# Patient Record
Sex: Male | Born: 1935 | Race: Black or African American | Hispanic: No | Marital: Married | Smoking: Never smoker
Health system: Southern US, Community
[De-identification: ages and names within clinical notes are randomized; demographics above are authoritative.]

## PROBLEM LIST (undated history)

## (undated) DIAGNOSIS — D649 Anemia, unspecified: Secondary | ICD-10-CM

## (undated) DIAGNOSIS — E039 Hypothyroidism, unspecified: Secondary | ICD-10-CM

## (undated) DIAGNOSIS — N189 Chronic kidney disease, unspecified: Secondary | ICD-10-CM

## (undated) DIAGNOSIS — D469 Myelodysplastic syndrome, unspecified: Principal | ICD-10-CM

## (undated) DIAGNOSIS — I10 Essential (primary) hypertension: Secondary | ICD-10-CM

## (undated) HISTORY — DX: Myelodysplastic syndrome, unspecified: D46.9

## (undated) HISTORY — DX: Hypothyroidism, unspecified: E03.9

---

## 2003-12-26 ENCOUNTER — Inpatient Hospital Stay: Payer: Self-pay

## 2003-12-26 ENCOUNTER — Other Ambulatory Visit: Payer: Self-pay

## 2005-01-28 ENCOUNTER — Inpatient Hospital Stay: Payer: Self-pay | Admitting: Internal Medicine

## 2005-03-06 ENCOUNTER — Observation Stay: Payer: Self-pay | Admitting: Gastroenterology

## 2007-12-09 ENCOUNTER — Inpatient Hospital Stay: Payer: Self-pay | Admitting: Internal Medicine

## 2008-12-01 ENCOUNTER — Ambulatory Visit: Payer: Self-pay | Admitting: Internal Medicine

## 2009-02-01 ENCOUNTER — Ambulatory Visit: Payer: Self-pay | Admitting: Internal Medicine

## 2009-03-01 ENCOUNTER — Ambulatory Visit: Payer: Self-pay | Admitting: Internal Medicine

## 2009-05-01 ENCOUNTER — Ambulatory Visit: Payer: Self-pay | Admitting: Internal Medicine

## 2009-05-03 ENCOUNTER — Ambulatory Visit: Payer: Self-pay | Admitting: Internal Medicine

## 2009-06-01 ENCOUNTER — Ambulatory Visit: Payer: Self-pay | Admitting: Internal Medicine

## 2009-08-01 ENCOUNTER — Ambulatory Visit: Payer: Self-pay | Admitting: Internal Medicine

## 2009-08-22 ENCOUNTER — Ambulatory Visit: Payer: Self-pay | Admitting: Gastroenterology

## 2009-08-25 ENCOUNTER — Ambulatory Visit: Payer: Self-pay | Admitting: Internal Medicine

## 2009-09-01 ENCOUNTER — Ambulatory Visit: Payer: Self-pay | Admitting: Internal Medicine

## 2009-10-14 ENCOUNTER — Inpatient Hospital Stay (HOSPITAL_COMMUNITY): Admission: EM | Admit: 2009-10-14 | Discharge: 2009-10-19 | Payer: Self-pay | Admitting: Emergency Medicine

## 2009-10-14 ENCOUNTER — Ambulatory Visit: Payer: Self-pay | Admitting: Pulmonary Disease

## 2009-11-10 ENCOUNTER — Ambulatory Visit (HOSPITAL_COMMUNITY): Admission: RE | Admit: 2009-11-10 | Discharge: 2009-11-10 | Payer: Self-pay | Admitting: Gastroenterology

## 2009-11-29 ENCOUNTER — Ambulatory Visit: Payer: Self-pay | Admitting: Internal Medicine

## 2009-12-01 ENCOUNTER — Ambulatory Visit: Payer: Self-pay | Admitting: Internal Medicine

## 2010-03-15 LAB — URINALYSIS, ROUTINE W REFLEX MICROSCOPIC
Hgb urine dipstick: NEGATIVE
Specific Gravity, Urine: 1.016 (ref 1.005–1.030)
Urobilinogen, UA: 1 mg/dL (ref 0.0–1.0)

## 2010-03-15 LAB — CBC
HCT: 23.6 % — ABNORMAL LOW (ref 39.0–52.0)
Hemoglobin: 10.2 g/dL — ABNORMAL LOW (ref 13.0–17.0)
Hemoglobin: 8.5 g/dL — ABNORMAL LOW (ref 13.0–17.0)
Hemoglobin: 8.6 g/dL — ABNORMAL LOW (ref 13.0–17.0)
Hemoglobin: 8.7 g/dL — ABNORMAL LOW (ref 13.0–17.0)
Hemoglobin: 9.3 g/dL — ABNORMAL LOW (ref 13.0–17.0)
MCH: 33.7 pg (ref 26.0–34.0)
MCH: 34 pg (ref 26.0–34.0)
MCH: 34.1 pg — ABNORMAL HIGH (ref 26.0–34.0)
MCH: 34.2 pg — ABNORMAL HIGH (ref 26.0–34.0)
MCHC: 34.8 g/dL (ref 30.0–36.0)
MCHC: 34.9 g/dL (ref 30.0–36.0)
MCHC: 35.3 g/dL (ref 30.0–36.0)
MCHC: 35.4 g/dL (ref 30.0–36.0)
MCHC: 35.6 g/dL (ref 30.0–36.0)
MCV: 96 fL (ref 78.0–100.0)
MCV: 96.5 fL (ref 78.0–100.0)
MCV: 97 fL (ref 78.0–100.0)
MCV: 97.1 fL (ref 78.0–100.0)
MCV: 97.7 fL (ref 78.0–100.0)
MCV: 98.1 fL (ref 78.0–100.0)
Platelets: 127 10*3/uL — ABNORMAL LOW (ref 150–400)
Platelets: 61 10*3/uL — ABNORMAL LOW (ref 150–400)
Platelets: 61 10*3/uL — ABNORMAL LOW (ref 150–400)
Platelets: 69 10*3/uL — ABNORMAL LOW (ref 150–400)
Platelets: 74 10*3/uL — ABNORMAL LOW (ref 150–400)
RBC: 2.49 MIL/uL — ABNORMAL LOW (ref 4.22–5.81)
RBC: 2.57 MIL/uL — ABNORMAL LOW (ref 4.22–5.81)
RBC: 2.58 MIL/uL — ABNORMAL LOW (ref 4.22–5.81)
RBC: 2.59 MIL/uL — ABNORMAL LOW (ref 4.22–5.81)
RBC: 2.98 MIL/uL — ABNORMAL LOW (ref 4.22–5.81)
RDW: 16.9 % — ABNORMAL HIGH (ref 11.5–15.5)
RDW: 17 % — ABNORMAL HIGH (ref 11.5–15.5)
RDW: 18.1 % — ABNORMAL HIGH (ref 11.5–15.5)
WBC: 5.7 10*3/uL (ref 4.0–10.5)
WBC: 6 10*3/uL (ref 4.0–10.5)
WBC: 8.1 10*3/uL (ref 4.0–10.5)

## 2010-03-15 LAB — URINE MICROSCOPIC-ADD ON

## 2010-03-15 LAB — COMPREHENSIVE METABOLIC PANEL
ALT: 19 U/L (ref 0–53)
ALT: 21 U/L (ref 0–53)
AST: 28 U/L (ref 0–37)
AST: 29 U/L (ref 0–37)
AST: 33 U/L (ref 0–37)
Albumin: 2.5 g/dL — ABNORMAL LOW (ref 3.5–5.2)
Alkaline Phosphatase: 69 U/L (ref 39–117)
BUN: 13 mg/dL (ref 6–23)
BUN: 34 mg/dL — ABNORMAL HIGH (ref 6–23)
CO2: 23 mEq/L (ref 19–32)
CO2: 23 mEq/L (ref 19–32)
CO2: 24 mEq/L (ref 19–32)
Calcium: 8.2 mg/dL — ABNORMAL LOW (ref 8.4–10.5)
Calcium: 8.3 mg/dL — ABNORMAL LOW (ref 8.4–10.5)
Chloride: 110 mEq/L (ref 96–112)
Chloride: 113 mEq/L — ABNORMAL HIGH (ref 96–112)
Creatinine, Ser: 1.27 mg/dL (ref 0.4–1.5)
Creatinine, Ser: 1.45 mg/dL (ref 0.4–1.5)
GFR calc Af Amer: >60 mL/min (ref >60)
GFR calc Af Amer: >60 mL/min (ref >60)
GFR calc Af Amer: >60 mL/min (ref >60)
GFR calc non Af Amer: 48 mL/min — ABNORMAL LOW (ref >60)
GFR calc non Af Amer: 51 mL/min — ABNORMAL LOW (ref >60)
GFR calc non Af Amer: 56 mL/min — ABNORMAL LOW (ref >60)
Glucose, Bld: 90 mg/dL (ref 70–99)
Potassium: 3.8 mEq/L (ref 3.5–5.1)
Sodium: 136 mEq/L (ref 135–145)
Sodium: 139 mEq/L (ref 135–145)
Total Bilirubin: 1.2 mg/dL (ref 0.3–1.2)
Total Bilirubin: 1.4 mg/dL — ABNORMAL HIGH (ref 0.3–1.2)
Total Protein: 5.2 g/dL — ABNORMAL LOW (ref 6.0–8.3)

## 2010-03-15 LAB — DIFFERENTIAL
Basophils Absolute: 0 10*3/uL (ref 0.0–0.1)
Eosinophils Relative: 2 % (ref 0–5)
Lymphocytes Relative: 22 % (ref 12–46)
Neutro Abs: 6.5 10*3/uL (ref 1.7–7.7)
Neutrophils Relative %: 65 % (ref 43–77)

## 2010-03-15 LAB — BASIC METABOLIC PANEL
CO2: 22 mEq/L (ref 19–32)
Calcium: 9 mg/dL (ref 8.4–10.5)
Chloride: 110 mEq/L (ref 96–112)
Creatinine, Ser: 1.44 mg/dL (ref 0.4–1.5)
GFR calc Af Amer: 58 mL/min — ABNORMAL LOW (ref >60)
Glucose, Bld: 88 mg/dL (ref 70–99)

## 2010-03-15 LAB — PROTIME-INR
INR: 1.32 (ref 0.00–1.49)
INR: 1.35 (ref 0.00–1.49)
INR: 1.35 (ref 0.00–1.49)
INR: 1.4 (ref 0.00–1.49)
Prothrombin Time: 16.6 seconds — ABNORMAL HIGH (ref 11.6–15.2)
Prothrombin Time: 16.9 seconds — ABNORMAL HIGH (ref 11.6–15.2)
Prothrombin Time: 16.9 seconds — ABNORMAL HIGH (ref 11.6–15.2)

## 2010-03-15 LAB — MRSA PCR SCREENING: MRSA by PCR: NEGATIVE

## 2010-03-15 LAB — URINE CULTURE: Culture: NO GROWTH

## 2010-03-15 LAB — MAGNESIUM: Magnesium: 1.8 mg/dL (ref 1.5–2.5)

## 2010-03-16 LAB — CBC
HCT: 25.8 % — ABNORMAL LOW (ref 39.0–52.0)
Hemoglobin: 9.3 g/dL — ABNORMAL LOW (ref 13.0–17.0)
Hemoglobin: 9.9 g/dL — ABNORMAL LOW (ref 13.0–17.0)
MCH: 34.1 pg — ABNORMAL HIGH (ref 26.0–34.0)
MCHC: 35.3 g/dL (ref 30.0–36.0)
MCV: 95.9 fL (ref 78.0–100.0)
Platelets: 62 10*3/uL — ABNORMAL LOW (ref 150–400)
Platelets: 85 10*3/uL — ABNORMAL LOW (ref 150–400)
RBC: 2.59 MIL/uL — ABNORMAL LOW (ref 4.22–5.81)
RBC: 2.9 MIL/uL — ABNORMAL LOW (ref 4.22–5.81)
RDW: 17.5 % — ABNORMAL HIGH (ref 11.5–15.5)
WBC: 12.6 10*3/uL — ABNORMAL HIGH (ref 4.0–10.5)
WBC: 14.1 10*3/uL — ABNORMAL HIGH (ref 4.0–10.5)

## 2010-03-16 LAB — TYPE AND SCREEN
Antibody Screen: NEGATIVE
Unit division: 0
Unit division: 0

## 2010-03-16 LAB — HEPATITIS PANEL, ACUTE
HCV Ab: NEGATIVE
Hep A IgM: NEGATIVE
Hep B C IgM: NEGATIVE
Hepatitis B Surface Ag: NEGATIVE

## 2010-03-16 LAB — PREPARE FRESH FROZEN PLASMA
Unit division: 0
Unit division: 0

## 2010-03-16 LAB — COMPREHENSIVE METABOLIC PANEL
ALT: 18 U/L (ref 0–53)
AST: 38 U/L — ABNORMAL HIGH (ref 0–37)
Albumin: 2.5 g/dL — ABNORMAL LOW (ref 3.5–5.2)
CO2: 16 mEq/L — ABNORMAL LOW (ref 19–32)
Chloride: 109 mEq/L (ref 96–112)
GFR calc Af Amer: 49 mL/min — ABNORMAL LOW (ref >60)
GFR calc non Af Amer: 41 mL/min — ABNORMAL LOW (ref >60)
Potassium: 4.4 mEq/L (ref 3.5–5.1)
Sodium: 137 mEq/L (ref 135–145)
Total Bilirubin: 2 mg/dL — ABNORMAL HIGH (ref 0.3–1.2)

## 2010-03-16 LAB — PREPARE RBC (CROSSMATCH)

## 2010-03-16 LAB — HEPATIC FUNCTION PANEL
ALT: 20 U/L (ref 0–53)
Bilirubin, Direct: 0.4 mg/dL — ABNORMAL HIGH (ref 0.0–0.3)
Indirect Bilirubin: 1.2 mg/dL — ABNORMAL HIGH (ref 0.3–0.9)
Total Protein: 5 g/dL — ABNORMAL LOW (ref 6.0–8.3)

## 2010-03-16 LAB — POCT I-STAT, CHEM 8
BUN: 35 mg/dL — ABNORMAL HIGH (ref 6–23)
Chloride: 111 mEq/L (ref 96–112)
Creatinine, Ser: 1.3 mg/dL (ref 0.4–1.5)
Glucose, Bld: 174 mg/dL — ABNORMAL HIGH (ref 70–99)
Hemoglobin: 9.2 g/dL — ABNORMAL LOW (ref 13.0–17.0)
Potassium: 4.7 mEq/L (ref 3.5–5.1)
Sodium: 141 mEq/L (ref 135–145)

## 2010-03-16 LAB — DIFFERENTIAL
Basophils Absolute: 0 10*3/uL (ref 0.0–0.1)
Basophils Absolute: 0 10*3/uL (ref 0.0–0.1)
Basophils Relative: 0 % (ref 0–1)
Eosinophils Absolute: 0 10*3/uL (ref 0.0–0.7)
Eosinophils Relative: 0 % (ref 0–5)
Eosinophils Relative: 1 % (ref 0–5)
Monocytes Absolute: 1 10*3/uL (ref 0.1–1.0)
Monocytes Absolute: 1.7 10*3/uL — ABNORMAL HIGH (ref 0.1–1.0)
Neutro Abs: 8.1 10*3/uL — ABNORMAL HIGH (ref 1.7–7.7)

## 2010-03-16 LAB — HEMOGLOBIN A1C: Hgb A1c MFr Bld: 5.4 % (ref <5.7)

## 2010-03-16 LAB — TSH: TSH: 0.824 u[IU]/mL (ref 0.350–4.500)

## 2010-03-16 LAB — MRSA PCR SCREENING: MRSA by PCR: NEGATIVE

## 2010-03-16 LAB — MAGNESIUM: Magnesium: 1.8 mg/dL (ref 1.5–2.5)

## 2010-03-16 LAB — PROTIME-INR: Prothrombin Time: 23.6 seconds — ABNORMAL HIGH (ref 11.6–15.2)

## 2010-03-16 LAB — ETHANOL: Alcohol, Ethyl (B): <5 mg/dL (ref 0–10)

## 2010-05-31 ENCOUNTER — Ambulatory Visit: Payer: Self-pay | Admitting: Internal Medicine

## 2010-06-02 ENCOUNTER — Ambulatory Visit: Payer: Self-pay | Admitting: Internal Medicine

## 2010-08-29 ENCOUNTER — Ambulatory Visit: Payer: Self-pay | Admitting: Internal Medicine

## 2010-09-02 ENCOUNTER — Ambulatory Visit: Payer: Self-pay | Admitting: Internal Medicine

## 2010-12-29 ENCOUNTER — Ambulatory Visit: Payer: Self-pay | Admitting: Internal Medicine

## 2011-01-02 ENCOUNTER — Ambulatory Visit: Payer: Self-pay | Admitting: Internal Medicine

## 2011-09-24 ENCOUNTER — Emergency Department: Payer: Self-pay | Admitting: Emergency Medicine

## 2011-09-24 LAB — COMPREHENSIVE METABOLIC PANEL
Albumin: 3.4 g/dL (ref 3.4–5.0)
Alkaline Phosphatase: 114 U/L (ref 50–136)
Anion Gap: 8 (ref 7–16)
BUN: 30 mg/dL — ABNORMAL HIGH (ref 7–18)
Bilirubin,Total: 0.9 mg/dL (ref 0.2–1.0)
Calcium, Total: 9.3 mg/dL (ref 8.5–10.1)
Co2: 25 mmol/L (ref 21–32)
EGFR (Non-African Amer.): 57 — ABNORMAL LOW
Glucose: 133 mg/dL — ABNORMAL HIGH (ref 65–99)
Potassium: 4.2 mmol/L (ref 3.5–5.1)
SGOT(AST): 31 U/L (ref 15–37)
Sodium: 141 mmol/L (ref 136–145)
Total Protein: 7.8 g/dL (ref 6.4–8.2)

## 2011-09-24 LAB — PROTIME-INR
INR: 1.2
Prothrombin Time: 15.4 secs — ABNORMAL HIGH (ref 11.5–14.7)

## 2011-09-24 LAB — CBC
MCHC: 33.3 g/dL (ref 32.0–36.0)
MCV: 102 fL — ABNORMAL HIGH (ref 80–100)
Platelet: 146 10*3/uL — ABNORMAL LOW (ref 150–440)
RBC: 3.74 10*6/uL — ABNORMAL LOW (ref 4.40–5.90)
RDW: 17.3 % — ABNORMAL HIGH (ref 11.5–14.5)
WBC: 6 10*3/uL (ref 3.8–10.6)

## 2011-09-24 LAB — URINALYSIS, COMPLETE
Bacteria: NONE SEEN
Glucose,UR: NEGATIVE mg/dL (ref 0–75)
Ketone: NEGATIVE
Nitrite: NEGATIVE
Ph: 5 (ref 4.5–8.0)
Squamous Epithelial: 1
WBC UR: NONE SEEN /HPF (ref 0–5)

## 2011-10-05 ENCOUNTER — Ambulatory Visit: Payer: Self-pay | Admitting: Gastroenterology

## 2011-12-28 ENCOUNTER — Ambulatory Visit: Payer: Self-pay | Admitting: Oncology

## 2011-12-28 LAB — CBC CANCER CENTER
Basophil #: 0 x10 3/mm (ref 0.0–0.1)
Basophil %: 0.7 %
Eosinophil #: 0.1 x10 3/mm (ref 0.0–0.7)
Eosinophil %: 1.4 %
HGB: 11.6 g/dL — ABNORMAL LOW (ref 13.0–18.0)
Lymphocyte #: 0.7 x10 3/mm — ABNORMAL LOW (ref 1.0–3.6)
Lymphocyte %: 16 %
MCH: 26.1 pg (ref 26.0–34.0)
MCHC: 31.3 g/dL — ABNORMAL LOW (ref 32.0–36.0)
MCV: 83 fL (ref 80–100)
Neutrophil #: 2.9 x10 3/mm (ref 1.4–6.5)
Neutrophil %: 67 %
Platelet: 97 x10 3/mm — ABNORMAL LOW (ref 150–440)
RBC: 4.43 10*6/uL (ref 4.40–5.90)
WBC: 4.4 x10 3/mm (ref 3.8–10.6)

## 2012-01-02 ENCOUNTER — Ambulatory Visit: Payer: Self-pay | Admitting: Oncology

## 2012-02-02 ENCOUNTER — Inpatient Hospital Stay: Payer: Self-pay | Admitting: Internal Medicine

## 2012-02-02 LAB — CK TOTAL AND CKMB (NOT AT ARMC): CK, Total: 156 U/L (ref 35–232)

## 2012-02-02 LAB — COMPREHENSIVE METABOLIC PANEL
Albumin: 3.2 g/dL — ABNORMAL LOW (ref 3.4–5.0)
Alkaline Phosphatase: 128 U/L (ref 50–136)
Anion Gap: 7 (ref 7–16)
BUN: 49 mg/dL — ABNORMAL HIGH (ref 7–18)
Bilirubin,Total: 0.8 mg/dL (ref 0.2–1.0)
Chloride: 112 mmol/L — ABNORMAL HIGH (ref 98–107)
Co2: 25 mmol/L (ref 21–32)
EGFR (African American): >60
EGFR (Non-African Amer.): 52 — ABNORMAL LOW
Glucose: 121 mg/dL — ABNORMAL HIGH (ref 65–99)
Osmolality: 301 (ref 275–301)
Potassium: 4.4 mmol/L (ref 3.5–5.1)
SGPT (ALT): 32 U/L (ref 12–78)
Total Protein: 7.2 g/dL (ref 6.4–8.2)

## 2012-02-02 LAB — CBC
HCT: 25.8 % — ABNORMAL LOW (ref 40.0–52.0)
HGB: 8.1 g/dL — ABNORMAL LOW (ref 13.0–18.0)
MCHC: 31.2 g/dL — ABNORMAL LOW (ref 32.0–36.0)
MCV: 83 fL (ref 80–100)
RBC: 3.1 10*6/uL — ABNORMAL LOW (ref 4.40–5.90)

## 2012-02-02 LAB — PROTIME-INR: INR: 1.3

## 2012-02-02 LAB — HEMOGLOBIN: HGB: 7 g/dL — ABNORMAL LOW (ref 13.0–18.0)

## 2012-02-03 LAB — CBC WITH DIFFERENTIAL/PLATELET
Basophil %: 0.5 %
Eosinophil #: 0.2 10*3/uL (ref 0.0–0.7)
Eosinophil %: 2.7 %
HCT: 20.6 % — ABNORMAL LOW (ref 40.0–52.0)
HGB: 6.4 g/dL — ABNORMAL LOW (ref 13.0–18.0)
Lymphocyte %: 27.7 %
MCH: 26 pg (ref 26.0–34.0)
MCV: 83 fL (ref 80–100)
Monocyte %: 13.9 %
RBC: 2.48 10*6/uL — ABNORMAL LOW (ref 4.40–5.90)
RDW: 20.9 % — ABNORMAL HIGH (ref 11.5–14.5)

## 2012-02-03 LAB — COMPREHENSIVE METABOLIC PANEL
Albumin: 2.6 g/dL — ABNORMAL LOW (ref 3.4–5.0)
Alkaline Phosphatase: 97 U/L (ref 50–136)
Anion Gap: 9 (ref 7–16)
Calcium, Total: 8.2 mg/dL — ABNORMAL LOW (ref 8.5–10.1)
Creatinine: 1.42 mg/dL — ABNORMAL HIGH (ref 0.60–1.30)
Potassium: 4.6 mmol/L (ref 3.5–5.1)
SGOT(AST): 29 U/L (ref 15–37)
SGPT (ALT): 24 U/L (ref 12–78)
Sodium: 143 mmol/L (ref 136–145)
Total Protein: 5.8 g/dL — ABNORMAL LOW (ref 6.4–8.2)

## 2012-02-03 LAB — HEMOGLOBIN: HGB: 7.6 g/dL — ABNORMAL LOW (ref 13.0–18.0)

## 2012-02-04 LAB — HEMOGLOBIN
HGB: 7.7 g/dL — ABNORMAL LOW (ref 13.0–18.0)
HGB: 8.4 g/dL — ABNORMAL LOW (ref 13.0–18.0)

## 2012-02-04 LAB — PLATELET COUNT: Platelet: 94 10*3/uL — ABNORMAL LOW (ref 150–440)

## 2012-07-01 ENCOUNTER — Ambulatory Visit: Payer: Self-pay | Admitting: Oncology

## 2012-07-11 LAB — CBC CANCER CENTER
Eosinophil %: 2.2 %
HCT: 29.9 % — ABNORMAL LOW (ref 40.0–52.0)
Lymphocyte %: 18.3 %
MCHC: 31.2 g/dL — ABNORMAL LOW (ref 32.0–36.0)
MCV: 73 fL — ABNORMAL LOW (ref 80–100)
Monocyte %: 15.8 %
Neutrophil #: 2.5 x10 3/mm (ref 1.4–6.5)
Neutrophil %: 62.8 %
Platelet: 110 x10 3/mm — ABNORMAL LOW (ref 150–440)
RBC: 4.07 10*6/uL — ABNORMAL LOW (ref 4.40–5.90)
RDW: 21.7 % — ABNORMAL HIGH (ref 11.5–14.5)
WBC: 4.1 x10 3/mm (ref 3.8–10.6)

## 2012-08-01 ENCOUNTER — Ambulatory Visit: Payer: Self-pay | Admitting: Oncology

## 2013-01-16 ENCOUNTER — Ambulatory Visit: Payer: Self-pay | Admitting: Oncology

## 2013-01-16 LAB — CBC CANCER CENTER
Bands: 2 %
HCT: 35.3 % — ABNORMAL LOW (ref 40.0–52.0)
HGB: 10.9 g/dL — AB (ref 13.0–18.0)
Lymphocytes: 13 %
MCH: 25.5 pg — ABNORMAL LOW (ref 26.0–34.0)
MCHC: 31 g/dL — ABNORMAL LOW (ref 32.0–36.0)
MCV: 82 fL (ref 80–100)
MONOS PCT: 7 %
Platelet: 98 x10 3/mm — ABNORMAL LOW (ref 150–440)
RBC: 4.29 10*6/uL — ABNORMAL LOW (ref 4.40–5.90)
RDW: 20.7 % — AB (ref 11.5–14.5)
Segmented Neutrophils: 78 %
WBC: 4.7 x10 3/mm (ref 3.8–10.6)

## 2013-02-01 ENCOUNTER — Ambulatory Visit: Payer: Self-pay | Admitting: Oncology

## 2013-04-02 ENCOUNTER — Ambulatory Visit: Payer: Self-pay | Admitting: Gastroenterology

## 2013-05-04 ENCOUNTER — Inpatient Hospital Stay: Payer: Self-pay | Admitting: Internal Medicine

## 2013-05-04 LAB — URINALYSIS, COMPLETE
BACTERIA: NONE SEEN
Bilirubin,UR: NEGATIVE
Blood: NEGATIVE
GLUCOSE, UR: NEGATIVE mg/dL (ref 0–75)
Nitrite: NEGATIVE
Ph: 6 (ref 4.5–8.0)
Protein: NEGATIVE
RBC,UR: <1 /HPF (ref 0–5)
Specific Gravity: 1.015 (ref 1.003–1.030)
WBC UR: 5 /HPF (ref 0–5)

## 2013-05-04 LAB — COMPREHENSIVE METABOLIC PANEL
ALBUMIN: 2.8 g/dL — AB (ref 3.4–5.0)
ALT: 36 U/L (ref 12–78)
Alkaline Phosphatase: 347 U/L — ABNORMAL HIGH
Anion Gap: 14 (ref 7–16)
BUN: 25 mg/dL — AB (ref 7–18)
Bilirubin,Total: 2 mg/dL — ABNORMAL HIGH (ref 0.2–1.0)
CHLORIDE: 104 mmol/L (ref 98–107)
CO2: 20 mmol/L — AB (ref 21–32)
Calcium, Total: 9.6 mg/dL (ref 8.5–10.1)
Creatinine: 1.09 mg/dL (ref 0.60–1.30)
EGFR (African American): >60
EGFR (Non-African Amer.): >60
Glucose: 104 mg/dL — ABNORMAL HIGH (ref 65–99)
Osmolality: 280 (ref 275–301)
POTASSIUM: 3.6 mmol/L (ref 3.5–5.1)
SGOT(AST): 92 U/L — ABNORMAL HIGH (ref 15–37)
Sodium: 138 mmol/L (ref 136–145)
TOTAL PROTEIN: 8.1 g/dL (ref 6.4–8.2)

## 2013-05-04 LAB — CBC
HCT: 32.5 % — ABNORMAL LOW (ref 40.0–52.0)
HGB: 10.2 g/dL — ABNORMAL LOW (ref 13.0–18.0)
MCH: 26.9 pg (ref 26.0–34.0)
MCHC: 31.3 g/dL — AB (ref 32.0–36.0)
MCV: 86 fL (ref 80–100)
Platelet: 113 10*3/uL — ABNORMAL LOW (ref 150–440)
RBC: 3.79 10*6/uL — AB (ref 4.40–5.90)
RDW: 20.3 % — AB (ref 11.5–14.5)
WBC: 6.5 10*3/uL (ref 3.8–10.6)

## 2013-05-04 LAB — TROPONIN I: TROPONIN-I: 0.03 ng/mL

## 2013-05-04 LAB — HEMOGLOBIN: HGB: 8.8 g/dL — ABNORMAL LOW (ref 13.0–18.0)

## 2013-05-05 LAB — CBC WITH DIFFERENTIAL/PLATELET
Basophil #: 0.1 10*3/uL (ref 0.0–0.1)
Basophil %: 1 %
EOS ABS: 0 10*3/uL (ref 0.0–0.7)
Eosinophil %: 0.9 %
HCT: 27.6 % — ABNORMAL LOW (ref 40.0–52.0)
HGB: 8.5 g/dL — ABNORMAL LOW (ref 13.0–18.0)
LYMPHS ABS: 0.8 10*3/uL — AB (ref 1.0–3.6)
LYMPHS PCT: 15.2 %
MCH: 26.5 pg (ref 26.0–34.0)
MCHC: 30.7 g/dL — ABNORMAL LOW (ref 32.0–36.0)
MCV: 86 fL (ref 80–100)
Monocyte #: 0.7 x10 3/mm (ref 0.2–1.0)
Monocyte %: 12.9 %
Neutrophil #: 3.9 10*3/uL (ref 1.4–6.5)
Neutrophil %: 70 %
Platelet: 96 10*3/uL — ABNORMAL LOW (ref 150–440)
RBC: 3.2 10*6/uL — ABNORMAL LOW (ref 4.40–5.90)
RDW: 20.5 % — ABNORMAL HIGH (ref 11.5–14.5)
WBC: 5.5 10*3/uL (ref 3.8–10.6)

## 2013-05-05 LAB — BASIC METABOLIC PANEL
ANION GAP: 9 (ref 7–16)
BUN: 30 mg/dL — AB (ref 7–18)
CREATININE: 1.18 mg/dL (ref 0.60–1.30)
Calcium, Total: 9 mg/dL (ref 8.5–10.1)
Chloride: 109 mmol/L — ABNORMAL HIGH (ref 98–107)
Co2: 23 mmol/L (ref 21–32)
EGFR (African American): >60
EGFR (Non-African Amer.): 59 — ABNORMAL LOW
Glucose: 86 mg/dL (ref 65–99)
Osmolality: 287 (ref 275–301)
POTASSIUM: 4.2 mmol/L (ref 3.5–5.1)
Sodium: 141 mmol/L (ref 136–145)

## 2013-05-05 LAB — HEMOGLOBIN: HGB: 6.4 g/dL — AB (ref 13.0–18.0)

## 2013-05-05 LAB — PROTIME-INR
INR: 1.9
Prothrombin Time: 21 secs — ABNORMAL HIGH (ref 11.5–14.7)

## 2013-05-06 ENCOUNTER — Inpatient Hospital Stay (HOSPITAL_COMMUNITY): Payer: Medicare Other

## 2013-05-06 ENCOUNTER — Encounter (HOSPITAL_COMMUNITY): Payer: Self-pay | Admitting: Radiology

## 2013-05-06 ENCOUNTER — Inpatient Hospital Stay (HOSPITAL_COMMUNITY)
Admit: 2013-05-06 | Discharge: 2013-05-20 | DRG: 377 | Disposition: A | Discharge status: Skilled Nursing Facility | Payer: Medicare Other | Source: Other Acute Inpatient Hospital | Attending: Internal Medicine | Admitting: Internal Medicine

## 2013-05-06 DIAGNOSIS — E872 Acidosis, unspecified: Secondary | ICD-10-CM

## 2013-05-06 DIAGNOSIS — G9340 Encephalopathy, unspecified: Secondary | ICD-10-CM | POA: Diagnosis present

## 2013-05-06 DIAGNOSIS — R578 Other shock: Secondary | ICD-10-CM

## 2013-05-06 DIAGNOSIS — E876 Hypokalemia: Secondary | ICD-10-CM | POA: Diagnosis present

## 2013-05-06 DIAGNOSIS — F10239 Alcohol dependence with withdrawal, unspecified: Secondary | ICD-10-CM

## 2013-05-06 DIAGNOSIS — D649 Anemia, unspecified: Secondary | ICD-10-CM

## 2013-05-06 DIAGNOSIS — I864 Gastric varices: Secondary | ICD-10-CM

## 2013-05-06 DIAGNOSIS — E46 Unspecified protein-calorie malnutrition: Secondary | ICD-10-CM | POA: Diagnosis present

## 2013-05-06 DIAGNOSIS — R338 Other retention of urine: Secondary | ICD-10-CM

## 2013-05-06 DIAGNOSIS — K922 Gastrointestinal hemorrhage, unspecified: Secondary | ICD-10-CM

## 2013-05-06 DIAGNOSIS — R188 Other ascites: Secondary | ICD-10-CM | POA: Diagnosis not present

## 2013-05-06 DIAGNOSIS — K921 Melena: Secondary | ICD-10-CM | POA: Diagnosis present

## 2013-05-06 DIAGNOSIS — I4891 Unspecified atrial fibrillation: Secondary | ICD-10-CM

## 2013-05-06 DIAGNOSIS — R404 Transient alteration of awareness: Secondary | ICD-10-CM | POA: Diagnosis present

## 2013-05-06 DIAGNOSIS — D696 Thrombocytopenia, unspecified: Secondary | ICD-10-CM | POA: Diagnosis present

## 2013-05-06 DIAGNOSIS — K7460 Unspecified cirrhosis of liver: Secondary | ICD-10-CM

## 2013-05-06 DIAGNOSIS — I129 Hypertensive chronic kidney disease with stage 1 through stage 4 chronic kidney disease, or unspecified chronic kidney disease: Secondary | ICD-10-CM | POA: Diagnosis present

## 2013-05-06 DIAGNOSIS — Z6826 Body mass index (BMI) 26.0-26.9, adult: Secondary | ICD-10-CM

## 2013-05-06 DIAGNOSIS — D62 Acute posthemorrhagic anemia: Secondary | ICD-10-CM | POA: Diagnosis present

## 2013-05-06 DIAGNOSIS — F1020 Alcohol dependence, uncomplicated: Secondary | ICD-10-CM | POA: Diagnosis present

## 2013-05-06 DIAGNOSIS — I8511 Secondary esophageal varices with bleeding: Secondary | ICD-10-CM

## 2013-05-06 DIAGNOSIS — R1312 Dysphagia, oropharyngeal phase: Secondary | ICD-10-CM | POA: Diagnosis not present

## 2013-05-06 DIAGNOSIS — D638 Anemia in other chronic diseases classified elsewhere: Secondary | ICD-10-CM | POA: Diagnosis present

## 2013-05-06 DIAGNOSIS — Z7982 Long term (current) use of aspirin: Secondary | ICD-10-CM

## 2013-05-06 DIAGNOSIS — I8510 Secondary esophageal varices without bleeding: Secondary | ICD-10-CM

## 2013-05-06 DIAGNOSIS — F10939 Alcohol use, unspecified with withdrawal, unspecified: Secondary | ICD-10-CM

## 2013-05-06 DIAGNOSIS — K7030 Alcoholic cirrhosis of liver without ascites: Secondary | ICD-10-CM

## 2013-05-06 DIAGNOSIS — N179 Acute kidney failure, unspecified: Secondary | ICD-10-CM

## 2013-05-06 DIAGNOSIS — K920 Hematemesis: Principal | ICD-10-CM | POA: Diagnosis present

## 2013-05-06 DIAGNOSIS — D689 Coagulation defect, unspecified: Secondary | ICD-10-CM

## 2013-05-06 DIAGNOSIS — E870 Hyperosmolality and hypernatremia: Secondary | ICD-10-CM | POA: Diagnosis present

## 2013-05-06 DIAGNOSIS — N189 Chronic kidney disease, unspecified: Secondary | ICD-10-CM | POA: Diagnosis present

## 2013-05-06 DIAGNOSIS — R197 Diarrhea, unspecified: Secondary | ICD-10-CM

## 2013-05-06 DIAGNOSIS — R339 Retention of urine, unspecified: Secondary | ICD-10-CM | POA: Diagnosis not present

## 2013-05-06 DIAGNOSIS — J9600 Acute respiratory failure, unspecified whether with hypoxia or hypercapnia: Secondary | ICD-10-CM

## 2013-05-06 DIAGNOSIS — R7309 Other abnormal glucose: Secondary | ICD-10-CM | POA: Diagnosis present

## 2013-05-06 DIAGNOSIS — I868 Varicose veins of other specified sites: Secondary | ICD-10-CM | POA: Diagnosis present

## 2013-05-06 HISTORY — DX: Chronic kidney disease, unspecified: N18.9

## 2013-05-06 HISTORY — DX: Essential (primary) hypertension: I10

## 2013-05-06 HISTORY — DX: Anemia, unspecified: D64.9

## 2013-05-06 LAB — CBC
HCT: 21.1 % — ABNORMAL LOW (ref 39.0–52.0)
HEMATOCRIT: 22.3 % — AB (ref 39.0–52.0)
Hemoglobin: 7.2 g/dL — ABNORMAL LOW (ref 13.0–17.0)
Hemoglobin: 6.9 g/dL — CL (ref 13.0–17.0)
MCH: 27.6 pg (ref 26.0–34.0)
MCH: 27.8 pg (ref 26.0–34.0)
MCHC: 32.3 g/dL (ref 30.0–36.0)
MCHC: 32.7 g/dL (ref 30.0–36.0)
MCV: 85.4 fL (ref 78.0–100.0)
MCV: 85.1 fL (ref 78.0–100.0)
Platelets: 136 10*3/uL — ABNORMAL LOW (ref 150–400)
Platelets: 125 10*3/uL — ABNORMAL LOW (ref 150–400)
RBC: 2.61 MIL/uL — ABNORMAL LOW (ref 4.22–5.81)
RBC: 2.48 MIL/uL — ABNORMAL LOW (ref 4.22–5.81)
RDW: 18.1 % — ABNORMAL HIGH (ref 11.5–15.5)
RDW: 18 % — ABNORMAL HIGH (ref 11.5–15.5)
WBC: 11.8 10*3/uL — ABNORMAL HIGH (ref 4.0–10.5)
WBC: 12.1 10*3/uL — ABNORMAL HIGH (ref 4.0–10.5)

## 2013-05-06 LAB — COMPREHENSIVE METABOLIC PANEL
ALT: 17 U/L (ref 0–53)
AST: 52 U/L — AB (ref 0–37)
Albumin: 2.1 g/dL — ABNORMAL LOW (ref 3.5–5.2)
Alkaline Phosphatase: 214 U/L — ABNORMAL HIGH (ref 39–117)
BILIRUBIN TOTAL: 3.5 mg/dL — AB (ref 0.3–1.2)
BUN: 49 mg/dL — AB (ref 6–23)
CO2: 16 meq/L — AB (ref 19–32)
Calcium: 8.1 mg/dL — ABNORMAL LOW (ref 8.4–10.5)
Chloride: 108 mEq/L (ref 96–112)
Creatinine, Ser: 1.45 mg/dL — ABNORMAL HIGH (ref 0.50–1.35)
GFR calc Af Amer: 52 mL/min — ABNORMAL LOW (ref >90)
GFR calc non Af Amer: 45 mL/min — ABNORMAL LOW (ref >90)
Glucose, Bld: 124 mg/dL — ABNORMAL HIGH (ref 70–99)
Potassium: 5.7 mEq/L — ABNORMAL HIGH (ref 3.7–5.3)
Sodium: 139 mEq/L (ref 137–147)
Total Protein: 5.6 g/dL — ABNORMAL LOW (ref 6.0–8.3)

## 2013-05-06 LAB — PHOSPHORUS
PHOSPHORUS: 3.8 mg/dL (ref 2.3–4.6)
Phosphorus: 4.2 mg/dL (ref 2.3–4.6)

## 2013-05-06 LAB — GLUCOSE, CAPILLARY
GLUCOSE-CAPILLARY: 118 mg/dL — AB (ref 70–99)
GLUCOSE-CAPILLARY: 147 mg/dL — AB (ref 70–99)
GLUCOSE-CAPILLARY: 153 mg/dL — AB (ref 70–99)
Glucose-Capillary: 102 mg/dL — ABNORMAL HIGH (ref 70–99)
Glucose-Capillary: 128 mg/dL — ABNORMAL HIGH (ref 70–99)
Glucose-Capillary: 155 mg/dL — ABNORMAL HIGH (ref 70–99)
Glucose-Capillary: 177 mg/dL — ABNORMAL HIGH (ref 70–99)

## 2013-05-06 LAB — BLOOD GAS, ARTERIAL
ACID-BASE DEFICIT: 7.5 mmol/L — AB (ref 0.0–2.0)
Bicarbonate: 17.1 mEq/L — ABNORMAL LOW (ref 20.0–24.0)
DRAWN BY: 252031
FIO2: 0.6 %
LHR: 14 {breaths}/min
MECHVT: 600 mL
O2 Saturation: 99.8 %
PCO2 ART: 31.8 mmHg — AB (ref 35.0–45.0)
PEEP: 5 cmH2O
PO2 ART: 238 mmHg — AB (ref 80.0–100.0)
Patient temperature: 98.6
TCO2: 18 mmol/L (ref 0–100)
pH, Arterial: 7.349 — ABNORMAL LOW (ref 7.350–7.450)

## 2013-05-06 LAB — BASIC METABOLIC PANEL
BUN: 57 mg/dL — ABNORMAL HIGH (ref 6–23)
CO2: 19 mEq/L (ref 19–32)
Calcium: 8.2 mg/dL — ABNORMAL LOW (ref 8.4–10.5)
Chloride: 111 mEq/L (ref 96–112)
Creatinine, Ser: 1.68 mg/dL — ABNORMAL HIGH (ref 0.50–1.35)
GFR calc Af Amer: 44 mL/min — ABNORMAL LOW (ref >90)
GFR calc non Af Amer: 38 mL/min — ABNORMAL LOW (ref >90)
Glucose, Bld: 144 mg/dL — ABNORMAL HIGH (ref 70–99)
Potassium: 4.9 mEq/L (ref 3.7–5.3)
Sodium: 143 mEq/L (ref 137–147)

## 2013-05-06 LAB — PREPARE RBC (CROSSMATCH)

## 2013-05-06 LAB — MRSA PCR SCREENING: MRSA BY PCR: NEGATIVE

## 2013-05-06 LAB — MAGNESIUM
Magnesium: 1.5 mg/dL (ref 1.5–2.5)
Magnesium: 1.5 mg/dL (ref 1.5–2.5)

## 2013-05-06 LAB — HEMOGLOBIN AND HEMATOCRIT, BLOOD
HCT: 23.2 % — ABNORMAL LOW (ref 39.0–52.0)
HCT: 22.3 % — ABNORMAL LOW (ref 39.0–52.0)
Hemoglobin: 7.8 g/dL — ABNORMAL LOW (ref 13.0–17.0)
Hemoglobin: 7.7 g/dL — ABNORMAL LOW (ref 13.0–17.0)

## 2013-05-06 LAB — PROTIME-INR
INR: 2 — AB (ref 0.00–1.49)
Prothrombin Time: 22.1 seconds — ABNORMAL HIGH (ref 11.6–15.2)

## 2013-05-06 LAB — APTT: aPTT: 32 seconds (ref 24–37)

## 2013-05-06 MED ORDER — ROCURONIUM BROMIDE 50 MG/5ML IV SOLN
50.0000 mg | Freq: Once | INTRAVENOUS | Status: AC
  Administered 2013-05-06: 50 mg via INTRAVENOUS

## 2013-05-06 MED ORDER — LORAZEPAM 2 MG/ML IJ SOLN
1.0000 mg | INTRAMUSCULAR | Status: DC
  Administered 2013-05-06 – 2013-05-08 (×6): 1 mg via INTRAVENOUS
  Filled 2013-05-06 (×5): qty 1

## 2013-05-06 MED ORDER — PROPOFOL 10 MG/ML IV EMUL
INTRAVENOUS | Status: AC
  Filled 2013-05-06: qty 100

## 2013-05-06 MED ORDER — SODIUM CHLORIDE 0.9 % IV SOLN
50.0000 ug/h | INTRAVENOUS | Status: DC
  Administered 2013-05-06 – 2013-05-08 (×6): 50 ug/h via INTRAVENOUS
  Filled 2013-05-06 (×13): qty 1

## 2013-05-06 MED ORDER — PANTOPRAZOLE SODIUM 40 MG IV SOLR: 40.0000 mg | Freq: Two times a day (BID) | INTRAVENOUS | Status: DC

## 2013-05-06 MED ORDER — CHLORHEXIDINE GLUCONATE 0.12 % MT SOLN
15.0000 mL | Freq: Two times a day (BID) | OROMUCOSAL | Status: DC
  Administered 2013-05-06 – 2013-05-09 (×7): 15 mL via OROMUCOSAL
  Filled 2013-05-06 (×6): qty 15

## 2013-05-06 MED ORDER — FENTANYL CITRATE 0.05 MG/ML IJ SOLN
INTRAMUSCULAR | Status: AC
  Administered 2013-05-06: 100 ug via INTRAVENOUS
  Filled 2013-05-06: qty 4

## 2013-05-06 MED ORDER — SODIUM CHLORIDE 0.9 % IV SOLN
INTRAVENOUS | Status: DC
  Administered 2013-05-06: 02:00:00 via INTRAVENOUS

## 2013-05-06 MED ORDER — PROPOFOL 10 MG/ML IV EMUL
5.0000 ug/kg/min | INTRAVENOUS | Status: DC
  Administered 2013-05-06: 40 ug/kg/min via INTRAVENOUS
  Administered 2013-05-06 – 2013-05-07 (×2): 20 ug/kg/min via INTRAVENOUS
  Administered 2013-05-07: 15 ug/kg/min via INTRAVENOUS
  Administered 2013-05-07 (×2): 10 ug/kg/min via INTRAVENOUS
  Administered 2013-05-07: 18 ug/kg/min via INTRAVENOUS
  Administered 2013-05-08 (×2): 20 ug/kg/min via INTRAVENOUS
  Administered 2013-05-08: 15 ug/kg/min via INTRAVENOUS
  Filled 2013-05-06 (×6): qty 100

## 2013-05-06 MED ORDER — FENTANYL CITRATE 0.05 MG/ML IJ SOLN
100.0000 ug | Freq: Once | INTRAMUSCULAR | Status: AC
  Administered 2013-05-06: 100 ug via INTRAVENOUS

## 2013-05-06 MED ORDER — MAGNESIUM SULFATE 40 MG/ML IJ SOLN
2.0000 g | Freq: Once | INTRAMUSCULAR | Status: AC
  Administered 2013-05-06: 2 g via INTRAVENOUS
  Filled 2013-05-06: qty 50

## 2013-05-06 MED ORDER — THIAMINE HCL 100 MG/ML IJ SOLN
100.0000 mg | Freq: Every day | INTRAMUSCULAR | Status: DC
  Administered 2013-05-06 – 2013-05-15 (×10): 100 mg via INTRAVENOUS
  Filled 2013-05-06 (×10): qty 1

## 2013-05-06 MED ORDER — LORAZEPAM 2 MG/ML IJ SOLN
2.0000 mg | INTRAMUSCULAR | Status: DC | PRN
  Administered 2013-05-06: 2 mg via INTRAVENOUS
  Filled 2013-05-06: qty 2
  Filled 2013-05-06: qty 1

## 2013-05-06 MED ORDER — VITAMIN K1 10 MG/ML IJ SOLN
10.0000 mg | Freq: Every day | INTRAVENOUS | Status: AC
  Administered 2013-05-06 – 2013-05-08 (×3): 10 mg via INTRAVENOUS
  Filled 2013-05-06 (×3): qty 1

## 2013-05-06 MED ORDER — SODIUM CHLORIDE 0.45 % IV BOLUS: 1000.0000 mL | Freq: Once | INTRAVENOUS | Status: DC

## 2013-05-06 MED ORDER — DEXMEDETOMIDINE HCL IN NACL 200 MCG/50ML IV SOLN
0.4000 ug/kg/h | INTRAVENOUS | Status: DC
  Administered 2013-05-06: 0.4 ug/kg/h via INTRAVENOUS
  Filled 2013-05-06: qty 50

## 2013-05-06 MED ORDER — IOHEXOL 350 MG/ML SOLN
100.0000 mL | Freq: Once | INTRAVENOUS | Status: AC | PRN
  Administered 2013-05-06: 80 mL via INTRAVENOUS

## 2013-05-06 MED ORDER — MIDAZOLAM HCL 2 MG/2ML IJ SOLN
INTRAMUSCULAR | Status: AC
  Filled 2013-05-06: qty 4

## 2013-05-06 MED ORDER — NOREPINEPHRINE BITARTRATE 1 MG/ML IV SOLN
0.0000 ug/min | INTRAVENOUS | Status: DC
  Administered 2013-05-06: 2 ug/min via INTRAVENOUS
  Administered 2013-05-06: 6 ug/min via INTRAVENOUS
  Administered 2013-05-07: 8 ug/min via INTRAVENOUS
  Filled 2013-05-06 (×4): qty 4

## 2013-05-06 MED ORDER — PROPOFOL 10 MG/ML IV EMUL: 5.0000 ug/kg/min | INTRAVENOUS | Status: DC

## 2013-05-06 MED ORDER — FOLIC ACID 5 MG/ML IJ SOLN
1.0000 mg | Freq: Every day | INTRAMUSCULAR | Status: DC
  Administered 2013-05-06 – 2013-05-15 (×10): 1 mg via INTRAVENOUS
  Filled 2013-05-06 (×11): qty 0.2

## 2013-05-06 MED ORDER — DEXTROSE 5 % IV SOLN
1.0000 g | INTRAVENOUS | Status: DC
  Administered 2013-05-06 – 2013-05-13 (×8): 1 g via INTRAVENOUS
  Filled 2013-05-06 (×9): qty 10

## 2013-05-06 MED ORDER — SODIUM CHLORIDE 0.9 % IV SOLN
8.0000 mg/h | INTRAVENOUS | Status: DC
  Administered 2013-05-06 – 2013-05-07 (×4): 8 mg/h via INTRAVENOUS
  Filled 2013-05-06 (×9): qty 80

## 2013-05-06 MED ORDER — ETOMIDATE 2 MG/ML IV SOLN
20.0000 mg | Freq: Once | INTRAVENOUS | Status: AC
Start: 2013-05-06 — End: 2013-05-06
  Administered 2013-05-06: 20 mg via INTRAVENOUS

## 2013-05-06 MED ORDER — DEXTROSE-NACL 5-0.45 % IV SOLN
INTRAVENOUS | Status: DC
  Administered 2013-05-06 – 2013-05-09 (×5): via INTRAVENOUS
  Administered 2013-05-11: 1000 mL via INTRAVENOUS
  Administered 2013-05-11 – 2013-05-12 (×2): via INTRAVENOUS

## 2013-05-06 MED ORDER — SODIUM CHLORIDE 0.9 % IV SOLN
80.0000 mg | Freq: Once | INTRAVENOUS | Status: DC
  Filled 2013-05-06: qty 80

## 2013-05-06 MED ORDER — SODIUM CHLORIDE 0.45 % IV BOLUS
1000.0000 mL | Freq: Once | INTRAVENOUS | Status: AC
  Administered 2013-05-06: 1000 mL via INTRAVENOUS

## 2013-05-06 MED ORDER — SODIUM CHLORIDE 0.9 % IV SOLN: 250.0000 mL | INTRAVENOUS | Status: DC | PRN

## 2013-05-06 NOTE — H&P (Signed)
Await Ct abd to reassess portal venous anatomy and gastric varices Pt hemodynamically stable Depending on CT and clinical status over next 24-48hrs, we will consider for TIPS vs BRTO of GVs

## 2013-05-06 NOTE — Progress Notes (Signed)
[facility] Progress Note Patient Name: Inmer Nix DOB: January 10, 1934 MRN: [id number]  Date of Service  05/06/2013   HPI/Events of Note   Hgb of 6.9.  eICU Interventions  Plan: Transfuse 1 unit pRBC Post-transfusion CBC   Intervention Category Intermediate Interventions: Bleeding - evaluation and treatment with blood products  Guadelupe Sabin Atari Novick 05/06/2013, 6:36 AM

## 2013-05-06 NOTE — H&P (Signed)
Wallace Gappa is an 79 y.o. male.   Chief Complaint: Pt with remote history alcohol abuse Sober x 4 yrs per pt/family +cirrhosis Has had hematemesis in past with esophageal banding 4-5 yrs ago To ED at [facility] yesterday with hematemesis New esophageal banding at that time Bleeding resolved for short time and then rebled with hematemesis Transferred to [facility] for evaluation/stabilization and possible treatment CT in system several yrs ago does reveal gastric varices off from Renal vein per Dr Annamaria Boots  Probable new bleeding source. Have been asked to see pt and evaluate for possible candidacy for Transjugular Intrahepatic Portal System shunt or Balloon Occluded Retrograde Transvenous Obliteration procedures  Cr: 1.68 today Bili: 3.5 INR: 2.0 MELD: 22  Pt is at increased risk for encephalopathy postTIPS procedure. Will evaluate gastric varices with CT Angio Abd today Pt hemodynamically stable at this time. No further events of bleeding. Could be good candidate for BRTO. Dr Annamaria Boots discussed with Dr Alva Garnet.  HPI: Cirrhosis; anemia  No past medical history on file.  No past surgical history on file.  No family history on file. Social History:  has no tobacco, alcohol, and drug history on file.  Allergies: No Known Allergies  Medications Prior to Admission  Medication Sig Dispense Refill  . allopurinol (ZYLOPRIM) 300 MG tablet Take 300 mg by mouth daily.      Marland Kitchen aspirin EC 81 MG tablet Take 81 mg by mouth daily.      . cholecalciferol (VITAMIN D) 1000 UNITS tablet Take 1,000 Units by mouth daily.      . ferrous sulfate (FERROUSUL) 325 (65 FE) MG tablet Take 325 mg by mouth daily with breakfast.      . lansoprazole (PREVACID) 15 MG capsule Take 15 mg by mouth daily at 12 noon.      Marland Kitchen levothyroxine (SYNTHROID, LEVOTHROID) 75 MCG tablet Take 75 mcg by mouth daily before breakfast.      . nadolol (CORGARD) 40 MG tablet Take 40 mg by mouth daily.      . pantoprazole (PROTONIX) 40 MG  tablet Take 40 mg by mouth 2 (two) times daily.      Marland Kitchen spironolactone (ALDACTONE) 25 MG tablet Take 25 mg by mouth daily.      . sucralfate (CARAFATE) 1 G tablet Take 1 g by mouth 2 (two) times daily.        Results for orders placed during the hospital encounter of 05/06/13 (from the past 48 hour(s))  GLUCOSE, CAPILLARY     Status: Abnormal   Collection Time    05/06/13 12:51 AM      Result Value Ref Range   Glucose-Capillary 102 (*) 70 - 99 mg/dL   Comment 1 Notify RN    MRSA PCR SCREENING     Status: None   Collection Time    05/06/13  1:04 AM      Result Value Ref Range   MRSA by PCR NEGATIVE  NEGATIVE   Comment:            The GeneXpert MRSA Assay (FDA     approved for NASAL specimens     only), is one component of a     comprehensive MRSA colonization     surveillance program. It is not     intended to diagnose MRSA     infection nor to guide or     monitor treatment for     MRSA infections.  COMPREHENSIVE METABOLIC PANEL     Status: Abnormal  Collection Time    05/06/13  1:17 AM      Result Value Ref Range   Sodium 139  137 - 147 mEq/L   Potassium 5.7 (*) 3.7 - 5.3 mEq/L   Chloride 108  96 - 112 mEq/L   CO2 16 (*) 19 - 32 mEq/L   Glucose, Bld 124 (*) 70 - 99 mg/dL   BUN 49 (*) 6 - 23 mg/dL   Creatinine, Ser 1.45 (*) 0.50 - 1.35 mg/dL   Calcium 8.1 (*) 8.4 - 10.5 mg/dL   Total Protein 5.6 (*) 6.0 - 8.3 g/dL   Albumin 2.1 (*) 3.5 - 5.2 g/dL   AST 52 (*) 0 - 37 U/L   Comment: HEMOLYSIS AT THIS LEVEL MAY AFFECT RESULT   ALT 17  0 - 53 U/L   Alkaline Phosphatase 214 (*) 39 - 117 U/L   Total Bilirubin 3.5 (*) 0.3 - 1.2 mg/dL   GFR calc non Af Amer 45 (*) >90 mL/min   GFR calc Af Amer 52 (*) >90 mL/min   Comment: (NOTE)     The eGFR has been calculated using the CKD EPI equation.     This calculation has not been validated in all clinical situations.     eGFR's persistently <90 mL/min signify possible Chronic Kidney     Disease.  MAGNESIUM     Status: None    Collection Time    05/06/13  1:17 AM      Result Value Ref Range   Magnesium 1.5  1.5 - 2.5 mg/dL  PHOSPHORUS     Status: None   Collection Time    05/06/13  1:17 AM      Result Value Ref Range   Phosphorus 3.8  2.3 - 4.6 mg/dL  CBC     Status: Abnormal   Collection Time    05/06/13  1:17 AM      Result Value Ref Range   WBC 11.8 (*) 4.0 - 10.5 K/uL   RBC 2.61 (*) 4.22 - 5.81 MIL/uL   Hemoglobin 7.2 (*) 13.0 - 17.0 g/dL   HCT 22.3 (*) 39.0 - 52.0 %   MCV 85.4  78.0 - 100.0 fL   MCH 27.6  26.0 - 34.0 pg   MCHC 32.3  30.0 - 36.0 g/dL   RDW 18.1 (*) 11.5 - 15.5 %   Platelets 136 (*) 150 - 400 K/uL  PROTIME-INR     Status: Abnormal   Collection Time    05/06/13  1:17 AM      Result Value Ref Range   Prothrombin Time 22.1 (*) 11.6 - 15.2 seconds   INR 2.00 (*) 0.00 - 1.49  APTT     Status: None   Collection Time    05/06/13  1:17 AM      Result Value Ref Range   aPTT 32  24 - 37 seconds  TYPE AND SCREEN     Status: None   Collection Time    05/06/13  2:47 AM      Result Value Ref Range   ABO/RH(D) B POS     Antibody Screen NEG     Sample Expiration 05/09/2013     Unit Number [id number]     Blood Component Type RED CELLS,LR     Unit division 00     Status of Unit ISSUED     Transfusion Status OK TO TRANSFUSE     Crossmatch Result Compatible     Unit Number [id number]  Blood Component Type RED CELLS,LR     Unit division 00     Status of Unit ALLOCATED     Transfusion Status OK TO TRANSFUSE     Crossmatch Result Compatible     Unit Number [id number]     Blood Component Type RED CELLS,LR     Unit division 00     Status of Unit ALLOCATED     Transfusion Status OK TO TRANSFUSE     Crossmatch Result Compatible     Unit Number [id number]     Blood Component Type RED CELLS,LR     Unit division 00     Status of Unit ALLOCATED     Transfusion Status OK TO TRANSFUSE     Crossmatch Result Compatible    GLUCOSE, CAPILLARY     Status: Abnormal   Collection  Time    05/06/13  3:56 AM      Result Value Ref Range   Glucose-Capillary 118 (*) 70 - 99 mg/dL   Comment 1 Notify RN    CBC     Status: Abnormal   Collection Time    05/06/13  5:00 AM      Result Value Ref Range   WBC 12.1 (*) 4.0 - 10.5 K/uL   RBC 2.48 (*) 4.22 - 5.81 MIL/uL   Hemoglobin 6.9 (*) 13.0 - 17.0 g/dL   Comment: REPEATED TO VERIFY     CRITICAL RESULT CALLED TO, READ BACK BY AND VERIFIED WITH:     Ferrel Logan RN [id number] [phone] GREEN R   HCT 21.1 (*) 39.0 - 52.0 %   MCV 85.1  78.0 - 100.0 fL   MCH 27.8  26.0 - 34.0 pg   MCHC 32.7  30.0 - 36.0 g/dL   RDW 18.0 (*) 11.5 - 15.5 %   Platelets 125 (*) 150 - 400 K/uL  BASIC METABOLIC PANEL     Status: Abnormal   Collection Time    05/06/13  5:00 AM      Result Value Ref Range   Sodium 143  137 - 147 mEq/L   Potassium 4.9  3.7 - 5.3 mEq/L   Chloride 111  96 - 112 mEq/L   CO2 19  19 - 32 mEq/L   Glucose, Bld 144 (*) 70 - 99 mg/dL   BUN 57 (*) 6 - 23 mg/dL   Creatinine, Ser 1.68 (*) 0.50 - 1.35 mg/dL   Calcium 8.2 (*) 8.4 - 10.5 mg/dL   GFR calc non Af Amer 38 (*) >90 mL/min   GFR calc Af Amer 44 (*) >90 mL/min   Comment: (NOTE)     The eGFR has been calculated using the CKD EPI equation.     This calculation has not been validated in all clinical situations.     eGFR's persistently <90 mL/min signify possible Chronic Kidney     Disease.  MAGNESIUM     Status: None   Collection Time    05/06/13  5:00 AM      Result Value Ref Range   Magnesium 1.5  1.5 - 2.5 mg/dL  PHOSPHORUS     Status: None   Collection Time    05/06/13  5:00 AM      Result Value Ref Range   Phosphorus 4.2  2.3 - 4.6 mg/dL  PREPARE RBC (CROSSMATCH)     Status: None   Collection Time    05/06/13  6:38 AM      Result Value Ref Range   Order  Confirmation ORDER PROCESSED BY BLOOD BANK    GLUCOSE, CAPILLARY     Status: Abnormal   Collection Time    05/06/13  7:47 AM      Result Value Ref Range   Glucose-Capillary 128 (*) 70 - 99 mg/dL  PREPARE RBC  (CROSSMATCH)     Status: None   Collection Time    05/06/13 11:30 AM      Result Value Ref Range   Order Confirmation ORDER PROCESSED BY BLOOD BANK     No results found.  Review of Systems  Constitutional: Positive for weight loss. Negative for fever.  Respiratory: Positive for cough. Negative for shortness of breath.   Gastrointestinal: Negative for nausea, vomiting and abdominal pain.  Neurological: Positive for weakness.  Psychiatric/Behavioral:       Quit drinking x 4 yrs    Blood pressure 102/50, pulse 70, temperature 98 F (36.7 C), temperature source Axillary, resp. rate 25, height 6' 1"  (1.854 m), weight 86.2 kg (190 lb 0.6 oz), SpO2 100.00%. Physical Exam  Constitutional: He is oriented to person, place, and time. He appears well-developed.  Cardiovascular: Normal rate and regular rhythm.   No murmur heard. Respiratory: Effort normal and breath sounds normal. He has no wheezes.  GI: Soft. Bowel sounds are normal. There is no tenderness.  Musculoskeletal: Normal range of motion.  Neurological: He is alert and oriented to person, place, and time.  Skin: Skin is warm and dry.  Psychiatric: He has a normal mood and affect. His behavior is normal. Judgment and thought content normal.     Assessment/Plan Alcoholic cirrhosis Hx esoph varices banding 5 yrs ago Re bleed yesterday and new banding in [place] re bled just hrs later--prob gastric varices source Transferred to [facility] for eval; stabilization and poss treatment Dr Annamaria Boots has seen pt Will get CTA Abd today to evaluate gastric varices Cr elevated MELD 22 Pt hemodynamically stable at this time Will check CTA Monitor Creatinine and labs Will consider TIPS vs BRTO after CTA results.  Lavonia Drafts 05/06/2013, 11:19 AM

## 2013-05-06 NOTE — Procedures (Signed)
Central Venous Catheter Insertion Procedure Note -crodis Orest Dygert [id number] 01/18/1934  Procedure: Insertion of Central Venous Catheter Indications: Drug and/or fluid administration and Frequent blood sampling  Procedure Details Consent: Risks of procedure as well as the alternatives and risks of each were explained to the (patient/caregiver).  Consent for procedure obtained. Time Out: Verified patient identification, verified procedure, site/side was marked, verified correct patient position, special equipment/implants available, medications/allergies/relevent history reviewed, required imaging and test results available.  Performed  Maximum sterile technique was used including antiseptics, cap, gloves, gown, hand hygiene, mask and sheet. Skin prep: Chlorhexidine; local anesthetic administered A antimicrobial bonded/coated single lumen catheter was placed in the right femoral vein due to emergent situation using the Seldinger technique.  Evaluation Blood flow good Complications: No apparent complications Patient did tolerate procedure well.  Procedure performed under direct US guidance.   Montey Hora, PA - [facility] Pgr: [phone]  or [phone]  Emergent need access fro rapid Tx  [place]  Lavon Paganini. Titus Mould, MD, [place] Pgr: [facility]

## 2013-05-06 NOTE — Progress Notes (Signed)
CRITICAL VALUE ALERT  Critical value received:  HBG 6.9   Date of notification:  05/06/13  Time of notification:  0645   Critical value read back: Yes  Nurse who received alert:  Clifton Custard   MD notified (1st page):  Dr. Jimmy Footman  Time of first page:  [phone]   MD notified (2nd page):  Time of second page:  Responding MD:  Dr. Jimmy Footman  Time MD responded:  [phone]

## 2013-05-06 NOTE — Procedures (Signed)
PROCEDURE NOTE: CVL PLACEMENT  INDICATION:    Monitoring of central venous pressures and/or administration of medications optimally administered in central vein  CONSENT:   Risks of procedure as well as the alternatives were explained to the patient or surrogate. Consent for procedure obtained. A time out was performed to review patient identification, procedure to be performed, correct patient position, medications/allergies/relevent history, required imaging and test results.  PROCEDURE  Maximum sterile technique was used including antiseptics, cap, gloves, gown, hand hygiene, mask and sheet.  Skin prep: Chlorhexidine; local anesthetic administered  A antimicrobial bonded/coated triple lumen catheter was placed in the L [place] CVL using the Seldinger technique.    EVALUATION:  Blood flow good  Complications: No apparent complications  Patient tolerated the procedure well.  Chest X-ray ordered to verify placement and is pending   Merton Border, MD PCCM service Mobile [phone]

## 2013-05-06 NOTE — Progress Notes (Addendum)
PULMONARY / CRITICAL CARE MEDICINE   Name: Joseph Hawkins MRN: [id number] DOB: [birth year]    ADMISSION DATE:  05/06/2013 CONSULTATION DATE:  05/06/2013   REFERRING MD :  [facility] PRIMARY SERVICE: PCCM  CHIEF COMPLAINT:  UGI Bleed  BRIEF PATIENT DESCRIPTION: 57 M with alcoholic cirrhosis and varices s/p esophageal banding (5 - 6 yrs ago per pt), presented to [facility] with hematemesis due to variceal bleed. Underwent EGD and banding of esophageal varices but had further hematemesis presumed to be due to gastric varices. Therefore, he was transferred to [facility] ICU/PCCM service for consideration for TIPS or alternative interventions to treat gastric varices. Received one unit RBCs upon arrival  SIGNIFICANT EVENTS / STUDIES:  5/06 increased agitation/EtOH withdrawal syndrome. Intubated to permit diagnostic and therapeutic procedures 5/06 CTA abd: Gastric varices involving the gastric fundus and cardia. These varices are draining into the left renal vein and consistent with a gastrorenal shunt. No significant esophageal varices. 5/06 2nd unit PRBCs transfused   LINES / TUBES: R fem cordis 5/06 >>  ETT 5/06 >>  L [place] CVL 5/06 >>   CULTURES:   ANTIBIOTICS: Ceftriaxone (SBP px) 5/06 >>   SUBJECTIVE:  Vitals stable.  Denies chest pain, SOB.  VITAL SIGNS: Temp:  [97.6 F (36.4 C)-98.6 F (37 C)] 97.8 F (36.6 C) (05/06 1400) Pulse Rate:  [62-100] 73 (05/06 1430) Resp:  [14-29] 18 (05/06 1430) BP: (72-151)/(36-126) 124/86 mmHg (05/06 1430) SpO2:  [90 %-100 %] 100 % (05/06 1430) Weight:  [86.2 kg (190 lb 0.6 oz)] 86.2 kg (190 lb 0.6 oz) (05/06 0500) HEMODYNAMICS:   VENTILATOR SETTINGS:   INTAKE / OUTPUT: Intake/Output     05/05 0701 - 05/06 0700 05/06 0701 - 05/07 0700   I.V. (mL/kg) 450 (5.2) 1776.8 (20.6)   Blood  698.3   IV Piggyback  150   Total Intake(mL/kg) 450 (5.2) 2625.1 (30.5)   Urine (mL/kg/hr) 200 550 (0.7)   Total Output 200 550   Net +250 +2075.1        Urine Occurrence 1 x    Stool Occurrence  1 x     PHYSICAL EXAMINATION: General: Agitated, intermittently combative, Not F/C Neuro: no focal deficits HEENT: [place]/AT. PERRL, sclerae anicteric. Cardiovascular: RRR, no M Lungs: CTA bilat Abdomen: NABS, soft Ext: warm, no edema  LABS: I have reviewed all of today's lab results. Relevant abnormalities are discussed in the A/P section  CXR: post intubation film - LLL atx vs inf  ASSESSMENT / PLAN:  GASTROINTESTINAL A:   UGI Bleed Hx of Esophageal Varices Hx of Gastric Varices P:   SUP: PPI No TFs until variceal bleed addressed definitively IR seeing for possible TIPS  HEMATOLOGIC A:   Acute blood loss anemia Hepatic coagulopathy Mild thrombocytopenia P:  DVT px: SQ heparin Monitor CBC intermittently Transfuse RBCs per usual ICU guidelines Vitamin K ordered X 3 doses FFP if actively bleeding  PULMONARY A: Acute resp failure due to AMS/EtOH W/D P:   Vent settings established Vent bundle implemented Daily SBT as indicated  CARDIOVASCULAR A:  Hemorrhagic shock, resolved P:  CVP goal 10-14 MAP goal > 65 mmHg  RENAL A:  Acute renal insuff - suspect ATN due to htn. Doubt HRS P:   Monitor BMET intermittently Monitor I/Os Correct electrolytes as indicated  INFECTIOUS A:   No acute infections identified SBP px indicated P:   Micro and abx above  ENDOCRINE A:  Mild hyperglycemia without hx of DM P:   Monitor glu on chem  panels Initiate SSI for glu > 180  NEUROLOGIC A:   Severe delirium Mixed reports on EtOH hx - ? active P:   Propofol infusion Scheduled lorazepam PRN fentanyl Daily WUA  I have personally obtained a history, examined the patient, evaluated laboratory and imaging results, formulated the assessment and plan and placed orders. CRITICAL CARE: The patient is critically ill with multiple organ systems failure and requires high complexity decision making for assessment and support, frequent evaluation and  titration of therapies, application of advanced monitoring technologies and extensive interpretation of multiple databases. Critical Care Time devoted to patient care services described in this note is 50 minutes.   Merton Border, MD ; [facility] [phone].  After 5:30 PM or weekends, call [phone]   05/06/2013, 3:48 PM

## 2013-05-06 NOTE — Progress Notes (Signed)
1530-present time: event summary:  Around 1530 pt becomes combative, swinging arms, kicking and attempting to get out of bed.  Pt already had IV Ativan around 1430.  Restarted Precedex.  Gave another dose of Ativan.  After multiple attempts to calm pt with medications and comfort.  Decision was made to intubate for the safety of the patient and to be able to get diagnostic tests done.  Pt was intubated around 1600.  Started propofol for sedation and pt remained on Levophed.  Pt taken to CT around 1700.  Pt now is back in room, intubated, sedated, and vss.  No s/s of any acute distress or pain noted.

## 2013-05-06 NOTE — Progress Notes (Signed)
Pt transported to CT without event. Vital signs stable at this time. No complications. RT will continue to monitor.

## 2013-05-06 NOTE — H&P (Signed)
PULMONARY / CRITICAL CARE MEDICINE   Name: Joseph Hawkins MRN: [id number] DOB: Aug 01, 1934    ADMISSION DATE:  05/06/2013 CONSULTATION DATE:  05/06/2013   REFERRING MD :  OSH PRIMARY SERVICE: PCCM  CHIEF COMPLAINT:  UGI Bleed  BRIEF PATIENT DESCRIPTION: 79 y.o. 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  Pt transferred to [facility] and PCCM to assume care. Had banding, then re bleed concern for portal gastropathy and needs tips?  SIGNIFICANT EVENTS / STUDIES:  5/5 - presented to [facility] with UGIB. Bands esoph x 4  LINES / TUBES: Right femoral cordis 5/6 >>>  CULTURES: None  ANTIBIOTICS: None  HISTORY OF PRESENT ILLNESS:  Joseph Hawkins is a 79 y.o. M with PMH of Cirrhosis with esophageal and gastric varices s/p esophageal banding (5 - 6 yrs ago).  He presented to [facility] after he began to have sudden onset of small volume hematemesis.  He states that on 5/3, he noticed that his stools were dark and new that he was having minor bleeding; however, did not seek any medical treatment.  Melena continued and on AM of 5/5, he started having small volume hematemesis prompting him to go to [facility] for further evaluation.  At [facility], he was given 1 unit of PRBC's and was then transferred to [facility] for further evaluation and management, possible TIPS. Besides melena and hematemesis, pt denies any other symptoms.  Denies chest pain, palpitations, SOB, diaphoresis, lightheadedness, fatigue, syncope, any additional bleeding.   PAST MEDICAL HISTORY :  No past medical history on file. No past surgical history on file.  PMH is unknown/not on file.  Per pt, he only has Cirrhosis and Varices, no additional PMH.  Prior to Admission medications   Not on File   Allergies not on file  FAMILY HISTORY:  No family history on file. SOCIAL HISTORY:  has no tobacco, alcohol, and drug history on  file.  REVIEW OF SYSTEMS:   Gen: Denies fever, chills, weight change, fatigue, night sweats. HEENT: Denies blurred vision, double vision, hearing loss, tinnitus, sinus congestion, rhinorrhea, sore throat, neck stiffness, dysphagia. CV: Denies chest pain, edema, orthopnea, paroxysmal nocturnal dyspnea, palpitations. PULM: Denies shortness of breath, cough, sputum production, hemoptysis, wheezing. GI: Denies abdominal pain, diarrhea, hematochezia, constipation, change in bowel habits. + nausea, hematemesis, melena. GU: Denies dysuria, hematuria, polyuria, oliguria, urethral discharge. MSK:  Denies pain, deformity, edema, myalgias. Endocrine: Denies hot or cold intolerance, polyuria, polyphagia or appetite change. Heme: Denies easy bruising, bleeding gums. + bleeding Neuro: Denies headache, numbness, weakness, slurred speech, loss of memory or consciousness. Derm: Denies rash, dry skin, scaling or peeling skin change.   SUBJECTIVE:  Vitals stable.  Denies chest pain, SOB.  VITAL SIGNS:   HEMODYNAMICS:   VENTILATOR SETTINGS:   INTAKE / OUTPUT: Intake/Output   None     PHYSICAL EXAMINATION: General: Chronically ill appearing male, resting in bed, in NAD. Neuro: A&O x 3, non-focal.  HEENT: [place]/AT. PERRL, sclerae anicteric. Cardiovascular: RRR, no M/R/G.  Lungs: Respirations even and unlabored.  CTA bilaterally, No W/R/R.  Abdomen: BS x 4, soft, NT/ND.  Musculoskeletal: No gross deformities, no edema.  Skin: Intact, warm, no rashes.    LABS:  CBC No results found for this basename: WBC, HGB, HCT, PLT,  in the last 168 hours Coag's No results found for this basename: APTT, INR,  in the last 168 hours BMET  No results found for this basename: NA, K, CL, CO2, BUN, CREATININE, GLUCOSE,  in the last 168 hours Electrolytes No results found for this basename: CALCIUM, MG, PHOS,  in the last 168 hours Sepsis Markers No results found for this basename: LATICACIDVEN, PROCALCITON,  O2SATVEN,  in the last 168 hours ABG No results found for this basename: PHART, PCO2ART, PO2ART,  in the last 168 hours Liver Enzymes No results found for this basename: AST, ALT, ALKPHOS, BILITOT, ALBUMIN,  in the last 168 hours Cardiac Enzymes No results found for this basename: TROPONINI, PROBNP,  in the last 168 hours Glucose No results found for this basename: GLUCAP,  in the last 168 hours  Imaging No results found.   ASSESSMENT / PLAN:  GASTROINTESTINAL A:   UGI Bleed Hx of Esophageal Varices Hx of Gastric Varices P:   - Place cordis for rapid Tx needs - PPI and Octreotide ggt. - 1 unit PRBC (ordered at [facility]). - STAT CBC, Coags. - If Hgb still < 7, will require additional PRBC's. - H/H q6. - CBC in AM. - Have discussed case with IR. Concern would be emergent tuips with worse outcome. Goal is stability and re assess needs tips - i am not convinced needs tips immediate, arriving in reasonable BP, hgb and hemodynamics wnl. No further bleeding  HEMATOLOGIC A:   Anemia Active UGI Bleed P:  - VTE Proph:  SCD's only, no oral anticoags given active bleed. - PRBC's as above. - STAT CBC and Coags. - H/H q6. - CBC in AM. scd  PULMONARY A: At risk for ATX, impaired airway protection, aspiration, TRALI. P:   - IS / pulmonary hygiene. - CXR in AM.   CARDIOVASCULAR A:  At risk for hemorrhagic shock P:  - Monitor hemodynamics, if becomes unstable, may require pressor support. -lactic consider  RENAL A:  At risk hyperkalemia P:   - STAT CMP, Mag, Phos. - BMP in AM.  INFECTIOUS A:   SBP risk P:   - Monitor fever curve / WBC's. -empiric SBP prev ceftriaxone  ENDOCRINE A:  No known issues. P:   - CBG's q4 with Octreotide on board.  NEUROLOGIC A:   HIgh risk hepatic enceph P:   - lactulose when able -avoid benzo, sober for 4 years   Joseph Hawkins, [place] - C  Pulmonary & Critical Care Pgr: [phone]  or [phone]    I have  personally obtained a history, examined the patient, evaluated laboratory and imaging results, formulated the assessment and plan and placed orders. CRITICAL CARE: The patient is critically ill with multiple organ systems failure and requires high complexity decision making for assessment and support, frequent evaluation and titration of therapies, application of advanced monitoring technologies and extensive interpretation of multiple databases. Critical Care Time devoted to patient care services described in this note is 26minutes.   Lavon Paganini. Titus Mould, MD, [place] Pgr: [place] [facility]  Pulmonary and [place] Pager: [phone]  05/06/2013, 12:50 AM

## 2013-05-06 NOTE — Procedures (Signed)
Oral Intubation Procedure Note   Procedure: Intubation Indications: Respiratory insufficiency, failed extubation Consent: Unable to obtain consent because of altered level of consciousness. Time Out: Verified patient identification, verified procedure, site/side was marked, verified correct patient position, special equipment/implants available, medications/allergies/relevent history reviewed, required imaging and test results available.   Pre-meds: Etomidate 20 mg IV  Neuromuscular blockade: Rocuronium 50 mg IV  Laryngoscope: #4 MAC  Visualization: cords fully visualized  ETT: 8.0 ETT passed on first attempt and secured @ 24 cm at upper gums  Findings: bilateral BS, + color change on EZCAP   Evaluation:  CXR pending   Merton Border, MD ; [facility] service Mobile [phone].  After 5:30 PM or weekends, call [phone]

## 2013-05-07 ENCOUNTER — Encounter (HOSPITAL_COMMUNITY)
Disposition: A | Discharge status: Skilled Nursing Facility | Payer: Self-pay | Source: Other Acute Inpatient Hospital | Attending: Pulmonary Disease

## 2013-05-07 ENCOUNTER — Encounter (HOSPITAL_COMMUNITY): Payer: Self-pay | Admitting: Certified Registered"

## 2013-05-07 ENCOUNTER — Inpatient Hospital Stay (HOSPITAL_COMMUNITY): Payer: Medicare Other | Admitting: Certified Registered"

## 2013-05-07 ENCOUNTER — Encounter (HOSPITAL_COMMUNITY): Payer: Medicare Other | Admitting: Certified Registered"

## 2013-05-07 ENCOUNTER — Inpatient Hospital Stay (HOSPITAL_COMMUNITY): Payer: Medicare Other

## 2013-05-07 DIAGNOSIS — F10239 Alcohol dependence with withdrawal, unspecified: Secondary | ICD-10-CM

## 2013-05-07 DIAGNOSIS — F10939 Alcohol use, unspecified with withdrawal, unspecified: Secondary | ICD-10-CM

## 2013-05-07 HISTORY — PX: RADIOLOGY WITH ANESTHESIA: SHX6223

## 2013-05-07 LAB — COMPREHENSIVE METABOLIC PANEL
ALBUMIN: 2 g/dL — AB (ref 3.5–5.2)
ALT: 17 U/L (ref 0–53)
AST: 39 U/L — ABNORMAL HIGH (ref 0–37)
Alkaline Phosphatase: 171 U/L — ABNORMAL HIGH (ref 39–117)
BUN: 53 mg/dL — ABNORMAL HIGH (ref 6–23)
CALCIUM: 7.5 mg/dL — AB (ref 8.4–10.5)
CO2: 18 mEq/L — ABNORMAL LOW (ref 19–32)
Chloride: 114 mEq/L — ABNORMAL HIGH (ref 96–112)
Creatinine, Ser: 1.63 mg/dL — ABNORMAL HIGH (ref 0.50–1.35)
GFR calc Af Amer: 45 mL/min — ABNORMAL LOW (ref >90)
GFR, EST NON AFRICAN AMERICAN: 39 mL/min — AB (ref >90)
Glucose, Bld: 163 mg/dL — ABNORMAL HIGH (ref 70–99)
Potassium: 4.4 mEq/L (ref 3.7–5.3)
Sodium: 142 mEq/L (ref 137–147)
Total Bilirubin: 1.5 mg/dL — ABNORMAL HIGH (ref 0.3–1.2)
Total Protein: 5.3 g/dL — ABNORMAL LOW (ref 6.0–8.3)

## 2013-05-07 LAB — CBC
HCT: 23.5 % — ABNORMAL LOW (ref 39.0–52.0)
Hemoglobin: 7.9 g/dL — ABNORMAL LOW (ref 13.0–17.0)
MCH: 28.9 pg (ref 26.0–34.0)
MCHC: 33.6 g/dL (ref 30.0–36.0)
MCV: 86.1 fL (ref 78.0–100.0)
PLATELETS: 133 10*3/uL — AB (ref 150–400)
RBC: 2.73 MIL/uL — ABNORMAL LOW (ref 4.22–5.81)
RDW: 17.5 % — AB (ref 11.5–15.5)
WBC: 10.9 10*3/uL — ABNORMAL HIGH (ref 4.0–10.5)

## 2013-05-07 LAB — PROTIME-INR
INR: 1.58 — AB (ref 0.00–1.49)
Prothrombin Time: 18.4 seconds — ABNORMAL HIGH (ref 11.6–15.2)

## 2013-05-07 LAB — POCT I-STAT 4, (NA,K, GLUC, HGB,HCT)
Glucose, Bld: 111 mg/dL — ABNORMAL HIGH (ref 70–99)
HCT: 21 % — ABNORMAL LOW (ref 39.0–52.0)
Hemoglobin: 7.1 g/dL — ABNORMAL LOW (ref 13.0–17.0)
POTASSIUM: 4.3 meq/L (ref 3.7–5.3)
SODIUM: 144 meq/L (ref 137–147)

## 2013-05-07 LAB — APTT: APTT: 34 s (ref 24–37)

## 2013-05-07 LAB — GLUCOSE, CAPILLARY
GLUCOSE-CAPILLARY: 150 mg/dL — AB (ref 70–99)
GLUCOSE-CAPILLARY: 128 mg/dL — AB (ref 70–99)
Glucose-Capillary: 109 mg/dL — ABNORMAL HIGH (ref 70–99)
Glucose-Capillary: 106 mg/dL — ABNORMAL HIGH (ref 70–99)

## 2013-05-07 SURGERY — RADIOLOGY WITH ANESTHESIA
Anesthesia: General

## 2013-05-07 MED ORDER — ONDANSETRON HCL 4 MG/2ML IJ SOLN: 4.0000 mg | Freq: Once | INTRAMUSCULAR | Status: AC | PRN

## 2013-05-07 MED ORDER — BIOTENE DRY MOUTH MT LIQD
15.0000 mL | Freq: Four times a day (QID) | OROMUCOSAL | Status: DC
  Administered 2013-05-08 – 2013-05-19 (×44): 15 mL via OROMUCOSAL

## 2013-05-07 MED ORDER — SODIUM CHLORIDE 0.9 % IV BOLUS (SEPSIS)
1000.0000 mL | Freq: Once | INTRAVENOUS | Status: AC
  Administered 2013-05-07: 1000 mL via INTRAVENOUS

## 2013-05-07 MED ORDER — ROCURONIUM BROMIDE 100 MG/10ML IV SOLN
INTRAVENOUS | Status: DC | PRN
  Administered 2013-05-07: 20 mg via INTRAVENOUS
  Administered 2013-05-07: 50 mg via INTRAVENOUS
  Administered 2013-05-07: 10 mg via INTRAVENOUS

## 2013-05-07 MED ORDER — HYDROMORPHONE HCL PF 1 MG/ML IJ SOLN: 0.2500 mg | INTRAMUSCULAR | Status: DC | PRN

## 2013-05-07 MED ORDER — FENTANYL CITRATE 0.05 MG/ML IJ SOLN
INTRAMUSCULAR | Status: DC | PRN
  Administered 2013-05-07: 100 ug via INTRAVENOUS

## 2013-05-07 MED ORDER — NOREPINEPHRINE BITARTRATE 1 MG/ML IV SOLN
2.0000 ug/min | INTRAVENOUS | Status: DC
  Administered 2013-05-07: 10 ug/min via INTRAVENOUS
  Administered 2013-05-07: 3 ug/min via INTRAVENOUS
  Filled 2013-05-07: qty 4

## 2013-05-07 MED ORDER — SODIUM CHLORIDE 0.9 % IV SOLN
INTRAVENOUS | Status: DC | PRN
  Administered 2013-05-07: 16:00:00 via INTRAVENOUS

## 2013-05-07 MED ORDER — IOHEXOL 300 MG/ML  SOLN
100.0000 mL | Freq: Once | INTRAMUSCULAR | Status: AC | PRN
  Administered 2013-05-07: 60 mL via INTRAVENOUS

## 2013-05-07 MED ORDER — MIDAZOLAM HCL 2 MG/2ML IJ SOLN
INTRAMUSCULAR | Status: DC | PRN
  Administered 2013-05-07: 2 mg via INTRAVENOUS

## 2013-05-07 MED ORDER — PANTOPRAZOLE SODIUM 40 MG IV SOLR
40.0000 mg | Freq: Two times a day (BID) | INTRAVENOUS | Status: DC
  Administered 2013-05-07 – 2013-05-09 (×5): 40 mg via INTRAVENOUS
  Filled 2013-05-07 (×7): qty 40

## 2013-05-07 NOTE — Anesthesia Postprocedure Evaluation (Signed)
  Anesthesia Post-op Note  Patient: Joseph Hawkins  Procedure(s) Performed: Procedure(s): RADIOLOGY WITH ANESTHESIA (N/A)  Patient Location: PACU  Anesthesia Type:General  Level of Consciousness: sedated, unresponsive and Patient remains intubated per anesthesia plan  Airway and Oxygen Therapy: Patient remains intubated per anesthesia plan and Patient placed on Ventilator (see vital sign flow sheet for setting)  Post-op Pain: none  Post-op Assessment: Post-op Vital signs reviewed, Patient's Cardiovascular Status Stable, Respiratory Function Stable, Patent Airway and Pain level controlled  Post-op Vital Signs: stable  Last Vitals:  Filed Vitals:   05/07/13 1800  BP: 117/66  Pulse: 54  Temp:   Resp: 15    Complications: No apparent anesthesia complications

## 2013-05-07 NOTE — Progress Notes (Signed)
Patient ID: Joseph Hawkins, male   DOB: 1934/03/06, 79 y.o.   MRN: [id number]  Patient seen.  Overnight intubation noted.  CT reviewed.  Large collateral trunk off left renal vein supplies large gastric varices.  Should be amenable to balloon occlusion obliteration and embolization.  Labs relatively stable.  Will proceed with BRTO/possible TIPS today with general anesthesia.  Wife present and signed consent now.  Risks of bleeding, infection, hepatic failure, worsening of encephalopathy and death discussed.

## 2013-05-07 NOTE — Procedures (Signed)
Interventional Radiology Procedure Note  Procedure: Balloon-occluded retrograde transvenous obliteration (BRTO) of gastric varices. Access: Right common femoral vein, 54F sheath and balloon occlusion catheter still in place Complications: None immediate Recommendations: - Bedrest overnight, leg straight - Wean to extubation - Return to IR in am for balloon take-down and sheath pull  Signed,  Criselda Peaches, MD [facility] Radiology

## 2013-05-07 NOTE — Progress Notes (Signed)
PULMONARY / CRITICAL CARE MEDICINE   Name: Joseph Hawkins MRN: [id number] DOB: 02-May-1934    ADMISSION DATE:  05/06/2013 CONSULTATION DATE:  05/06/2013   REFERRING MD :  OSH PRIMARY SERVICE: PCCM  CHIEF COMPLAINT:  UGI Bleed  BRIEF PATIENT DESCRIPTION: 71 M with alcoholic cirrhosis and varices s/p esophageal banding (5 - 6 yrs ago per pt), presented to [facility] with hematemesis due to variceal bleed. Underwent EGD and banding of esophageal varices but had further hematemesis presumed to be due to gastric varices. Therefore, he was transferred to [facility] ICU/PCCM service for consideration for TIPS or alternative interventions to treat gastric varices. Received one unit RBCs upon arrival  SIGNIFICANT EVENTS / STUDIES:  5/06 increased agitation/EtOH withdrawal syndrome. Intubated to permit diagnostic and therapeutic procedures 5/06 CTA abd: Gastric varices involving the gastric fundus and cardia. These varices are draining into the left renal vein and consistent with a gastrorenal shunt. No significant esophageal varices. 5/06 2nd unit PRBCs transfused   LINES / TUBES: R fem cordis 5/06 >>  ETT 5/06 >>  L  CVL 5/06 >>   CULTURES:   ANTIBIOTICS: Ceftriaxone (SBP px) 5/06 >>   SUBJECTIVE:   Sedated. No distress  VITAL SIGNS: Temp:  [97.4 F (36.3 C)-98.9 F (37.2 C)] 97.4 F (36.3 C) (05/07 1120) Pulse Rate:  [48-89] 52 (05/07 1100) Resp:  [0-28] 18 (05/07 1100) BP: (89-145)/(44-86) 89/52 mmHg (05/07 1100) SpO2:  [100 %] 100 % (05/07 1100) FiO2 (%):  [30 %-100 %] 30 % (05/07 1120) Weight:  [90.7 kg (199 lb 15.3 oz)] 90.7 kg (199 lb 15.3 oz) (05/07 0400) HEMODYNAMICS: CVP:  [5 mmHg-6 mmHg] 6 mmHg VENTILATOR SETTINGS: Vent Mode:  [-] PSV;CPAP FiO2 (%):  [30 %-100 %] 30 % Set Rate:  [14 bmp] 14 bmp Vt Set:  [600 mL] 600 mL PEEP:  [5 cmH20] 5 cmH20 Pressure Support:  [5 cmH20] 5 cmH20 Plateau Pressure:  [15 cmH20-17 cmH20] 17 cmH20 INTAKE / OUTPUT: Intake/Output     05/06 0701 -  05/07 0700 05/07 0701 - 05/08 0700   I.V. (mL/kg) 5126 (56.5) 742.8 (8.2)   Blood 698.3    IV Piggyback 150 100   Total Intake(mL/kg) 5974.3 (65.9) 842.8 (9.3)   Urine (mL/kg/hr) 2750 (1.3) 550 (0.8)   Total Output 2750 550   Net +3224.3 +292.8        Stool Occurrence 1 x      PHYSICAL EXAMINATION: General: NAD, Not F/C Neuro: RASS -2, MAEs HEENT: [place]/AT. PERRL, sclerae anicteric. Cardiovascular: RRR, no M Lungs: CTA bilat Abdomen: NABS, soft Ext: warm, no edema  LABS: I have reviewed all of today's lab results. Relevant abnormalities are discussed in the A/P section  CXR: NNF  ASSESSMENT / PLAN:  GASTROINTESTINAL A:   UGI Bleed, appears to have resolved Esophageal Varices Gastric Varices P:   SUP: IV PPI No TFs until variceal bleed addressed definitively Cont octreotide infusion Planned BRTO vs TIPS by IR today  HEMATOLOGIC A:   Acute blood loss anemia Hepatic coagulopathy, improving Mild thrombocytopenia P:  DVT px: SCDs Monitor CBC intermittently Transfuse RBCs per usual ICU guidelines Complete 3 doses of vitamin K  PULMONARY A: Acute resp failure due to AMS/EtOH W/D P:   Vent settings established Vent bundle implemented Daily SBT as indicated  CARDIOVASCULAR A:  Hemorrhagic shock, resolved P:  CVP goal 10-14 MAP goal > 65 mmHg  RENAL A:  Acute renal insuff, slowly improving - suspect ATN due to hypotension P:  Monitor BMET intermittently Monitor I/Os Correct electrolytes as indicated  INFECTIOUS A:   No acute infections identified SBP px indicated P:   Micro and abx above  ENDOCRINE A:  Mild hyperglycemia without hx of DM P:   Monitor glu on chem panels Initiate SSI for glu > 180  NEUROLOGIC A:   Severe delirium Wife confirms active alcohol abuse immediately PTA P:   Cont propofol infusion Cont scheduled lorazepam Cont PRN fentanyl Daily WUA  I have personally obtained a history, examined the patient, evaluated  laboratory and imaging results, formulated the assessment and plan and placed orders. CRITICAL CARE: The patient is critically ill with multiple organ systems failure and requires high complexity decision making for assessment and support, frequent evaluation and titration of therapies, application of advanced monitoring technologies and extensive interpretation of multiple databases. Critical Care Time devoted to patient care services described in this note is 35 minutes.   Merton Border, MD ; [facility] [phone].  After 5:30 PM or weekends, call [phone]   05/07/2013, 2:18 PM

## 2013-05-07 NOTE — Anesthesia Procedure Notes (Signed)
Date/Time: 05/07/2013 3:39 PM Performed by: Julian Reil Pre-anesthesia Checklist: Patient identified, Emergency Drugs available, Suction available and Patient being monitored Patient Re-evaluated:Patient Re-evaluated prior to inductionOxygen Delivery Method: Circle system utilized Intubation Type: Inhalational induction with existing ETT Placement Confirmation: positive ETCO2 and breath sounds checked- equal and bilateral

## 2013-05-07 NOTE — Progress Notes (Signed)
UR Completed.  Brian Kocourek Jane Keyna Blizard [phone] 05/07/2013  

## 2013-05-07 NOTE — Transfer of Care (Signed)
Immediate Anesthesia Transfer of Care Note  Patient: Joseph Hawkins  Procedure(s) Performed: Procedure(s): RADIOLOGY WITH ANESTHESIA (N/A)  Patient Location: ICU  Anesthesia Type:General  Level of Consciousness: Patient remains intubated per anesthesia plan  Airway & Oxygen Therapy: Patient remains intubated per anesthesia plan and Patient placed on Ventilator (see vital sign flow sheet for setting)  Post-op Assessment: Report given to PACU RN and Post -op Vital signs reviewed and stable  Post vital signs: Reviewed and stable  Complications: No apparent anesthesia complications

## 2013-05-07 NOTE — Anesthesia Preprocedure Evaluation (Signed)
Anesthesia Evaluation  Patient identified by MRN, date of birth, ID band  Reviewed: Allergy & Precautions, H&P , NPO status , Patient's Chart, lab work & pertinent test results  Airway       Dental   Pulmonary  + rhonchi   + decreased breath sounds      Cardiovascular Rhythm:Regular Rate:Normal     Neuro/Psych    GI/Hepatic   Endo/Other    Renal/GU      Musculoskeletal   Abdominal   Peds  Hematology   Anesthesia Other Findings   Reproductive/Obstetrics                           Anesthesia Physical Anesthesia Plan  ASA: IV and emergent  Anesthesia Plan: General   Post-op Pain Management:    Induction: Intravenous  Airway Management Planned: Oral ETT  Additional Equipment: Arterial line  Intra-op Plan:   Post-operative Plan: Post-operative intubation/ventilation  Informed Consent: I have reviewed the patients History and Physical, chart, labs and discussed the procedure including the risks, benefits and alternatives for the proposed anesthesia with the patient or authorized representative who has indicated his/her understanding and acceptance.     Plan Discussed with: CRNA and Anesthesiologist  Anesthesia Plan Comments:         Anesthesia Quick Evaluation

## 2013-05-07 NOTE — Progress Notes (Signed)
Sotradecol 3% 4(30mg /ml) vials and Lipiodol 1 (4.8gm/37ml) injected per Dr. Laurence Ferrari during BRTO

## 2013-05-08 ENCOUNTER — Inpatient Hospital Stay (HOSPITAL_COMMUNITY): Payer: Medicare Other

## 2013-05-08 ENCOUNTER — Encounter (HOSPITAL_COMMUNITY): Payer: Self-pay | Admitting: Interventional Radiology

## 2013-05-08 DIAGNOSIS — D689 Coagulation defect, unspecified: Secondary | ICD-10-CM

## 2013-05-08 LAB — GLUCOSE, CAPILLARY
GLUCOSE-CAPILLARY: 145 mg/dL — AB (ref 70–99)
GLUCOSE-CAPILLARY: 117 mg/dL — AB (ref 70–99)
GLUCOSE-CAPILLARY: 111 mg/dL — AB (ref 70–99)
Glucose-Capillary: 119 mg/dL — ABNORMAL HIGH (ref 70–99)
Glucose-Capillary: 131 mg/dL — ABNORMAL HIGH (ref 70–99)
Glucose-Capillary: 140 mg/dL — ABNORMAL HIGH (ref 70–99)

## 2013-05-08 LAB — CBC
HCT: 26.1 % — ABNORMAL LOW (ref 39.0–52.0)
HEMOGLOBIN: 8.8 g/dL — AB (ref 13.0–17.0)
MCH: 29.5 pg (ref 26.0–34.0)
MCHC: 33.7 g/dL (ref 30.0–36.0)
MCV: 87.6 fL (ref 78.0–100.0)
Platelets: 72 10*3/uL — ABNORMAL LOW (ref 150–400)
RBC: 2.98 MIL/uL — AB (ref 4.22–5.81)
RDW: 17 % — ABNORMAL HIGH (ref 11.5–15.5)
WBC: 8 10*3/uL (ref 4.0–10.5)

## 2013-05-08 LAB — COMPREHENSIVE METABOLIC PANEL
ALK PHOS: 152 U/L — AB (ref 39–117)
ALT: 20 U/L (ref 0–53)
AST: 58 U/L — AB (ref 0–37)
Albumin: 1.9 g/dL — ABNORMAL LOW (ref 3.5–5.2)
BILIRUBIN TOTAL: 1.7 mg/dL — AB (ref 0.3–1.2)
BUN: 34 mg/dL — ABNORMAL HIGH (ref 6–23)
CHLORIDE: 114 meq/L — AB (ref 96–112)
CO2: 15 mEq/L — ABNORMAL LOW (ref 19–32)
Calcium: 7.3 mg/dL — ABNORMAL LOW (ref 8.4–10.5)
Creatinine, Ser: 1.13 mg/dL (ref 0.50–1.35)
GFR calc non Af Amer: 61 mL/min — ABNORMAL LOW (ref >90)
GFR, EST AFRICAN AMERICAN: 70 mL/min — AB (ref >90)
Glucose, Bld: 161 mg/dL — ABNORMAL HIGH (ref 70–99)
Potassium: 4.3 mEq/L (ref 3.7–5.3)
SODIUM: 141 meq/L (ref 137–147)
Total Protein: 5 g/dL — ABNORMAL LOW (ref 6.0–8.3)

## 2013-05-08 LAB — PROTIME-INR
INR: 1.5 — ABNORMAL HIGH (ref 0.00–1.49)
Prothrombin Time: 17.7 seconds — ABNORMAL HIGH (ref 11.6–15.2)

## 2013-05-08 MED ORDER — LORAZEPAM 2 MG/ML IJ SOLN: 1.0000 mg | INTRAMUSCULAR | Status: DC | PRN

## 2013-05-08 MED ORDER — HYDROMORPHONE HCL PF 1 MG/ML IJ SOLN: 0.5000 mg | INTRAMUSCULAR | Status: DC | PRN

## 2013-05-08 MED ORDER — IOHEXOL 300 MG/ML  SOLN
100.0000 mL | Freq: Once | INTRAMUSCULAR | Status: AC | PRN
Start: 2013-05-08 — End: 2013-05-08
  Administered 2013-05-08: 1 mL via INTRAVENOUS

## 2013-05-08 MED ORDER — FENTANYL CITRATE 0.05 MG/ML IJ SOLN: 12.5000 ug | INTRAMUSCULAR | Status: DC | PRN

## 2013-05-08 MED ORDER — LORAZEPAM 2 MG/ML IJ SOLN
1.0000 mg | Freq: Four times a day (QID) | INTRAMUSCULAR | Status: DC
  Administered 2013-05-09 (×2): 1 mg via INTRAVENOUS
  Filled 2013-05-08 (×3): qty 1

## 2013-05-08 NOTE — Progress Notes (Signed)
PULMONARY / CRITICAL CARE MEDICINE   Name: Joseph Hawkins MRN: [id number] DOB: Oct 15, 1934    ADMISSION DATE:  05/06/2013 CONSULTATION DATE:  05/06/2013   REFERRING MD :  OSH PRIMARY SERVICE: PCCM  CHIEF COMPLAINT:  UGI Bleed  BRIEF PATIENT DESCRIPTION: 49 M with alcoholic cirrhosis and varices s/p esophageal banding (5 - 6 yrs ago per pt), presented to [facility] with hematemesis due to variceal bleed. Underwent EGD and banding of esophageal varices but had further hematemesis presumed to be due to gastric varices. Therefore, he was transferred to [facility] ICU/PCCM service for consideration for TIPS or alternative interventions to treat gastric varices. Received one unit RBCs upon arrival. PPI and octreotide infusions initiated upon admission  SIGNIFICANT EVENTS / STUDIES:  5/06 increased agitation/EtOH withdrawal syndrome. Intubated to permit diagnostic and therapeutic procedures 5/06 CTA abd: Gastric varices involving the gastric fundus and cardia. These varices are draining into the left renal vein and consistent with a gastrorenal shunt. No significant esophageal varices. 5/06 2nd unit PRBCs transfused 5/07 BRTO performed by IR. Left intubated 5/08 BRTO catheter removed. Octreotide infusion stopped   LINES / TUBES: R fem cordis 5/06 >> 5/08 ETT 5/06 >>  L [place] CVL 5/06 >>  L radial art line 5/07 >>   CULTURES:   ANTIBIOTICS: Ceftriaxone (SBP px) 5/06 >>   SUBJECTIVE:   Sedated. No distress.   VITAL SIGNS: Temp:  [89 F (31.7 C)-98.5 F (36.9 C)] 98.1 F (36.7 C) (05/08 1545) Pulse Rate:  [52-89] 89 (05/08 1545) Resp:  [11-21] 21 (05/08 1545) BP: (72-149)/(47-77) 119/70 mmHg (05/08 1545) SpO2:  [100 %] 100 % (05/08 1545) Arterial Line BP: (88-173)/(38-75) 160/62 mmHg (05/08 1545) FiO2 (%):  [30 %-40 %] 30 % (05/08 1215) HEMODYNAMICS:   VENTILATOR SETTINGS: Vent Mode:  [-] PSV;CPAP FiO2 (%):  [30 %-40 %] 30 % Set Rate:  [14 bmp] 14 bmp Vt Set:  [600 mL] 600 mL PEEP:  [5 cmH20] 5  cmH20 Pressure Support:  [8 cmH20] 8 cmH20 Plateau Pressure:  [16 cmH20-18 cmH20] 18 cmH20 INTAKE / OUTPUT: Intake/Output     05/07 0701 - 05/08 0700 05/08 0701 - 05/09 0700   I.V. (mL/kg) 4126.8 (45.5) 1000 (11)   Blood 670    IV Piggyback 100 100   Total Intake(mL/kg) 4896.8 (54) 1100 (12.1)   Urine (mL/kg/hr) 2455 (1.1) 735 (0.8)   Total Output 2455 735   Net +2441.8 +365          PHYSICAL EXAMINATION: General: NAD, Not F/C Neuro: RASS -2, MAEs HEENT: [place]/AT. PERRL, sclerae anicteric. Cardiovascular: RRR, no M Lungs: CTA bilat Abdomen: NABS, soft Ext: warm, no edema  LABS: I have reviewed all of today's lab results. Relevant abnormalities are discussed in the A/P section  CXR: LLL atx  ASSESSMENT / PLAN:  GASTROINTESTINAL A:   UGI Bleed, appears to have resolved Esophageal Varices Gastric Varices, S/P BRTO P:   SUP: IV PPI No TFs until variceal bleed addressed definitively D/C octreotide infusion If not extubated 5/09, will need to consider placement of feeding tube and initiation of TFs  HEMATOLOGIC A:   Acute blood loss anemia  No evidence of ongoing bleeding Hepatic coagulopathy, improving after 3 doses Vit K Mild thrombocytopenia P:  DVT px: SCDs Monitor CBC intermittently Transfuse per usual ICU guidelines  PULMONARY A: Acute resp failure due to AMS/EtOH W/D P:   Vent settings established Vent bundle implemented Daily SBT as indicated  CARDIOVASCULAR A:  Hemorrhagic shock, resolved P:  CVP  goal 10-14 MAP goal > 65 mmHg  RENAL A:  Acute renal insuff, resolved P:   Monitor BMET intermittently Monitor I/Os Correct electrolytes as indicated  INFECTIOUS A:   No acute infections identified SBP px indicated P:   Micro and abx above  ENDOCRINE A:  Mild hyperglycemia without hx of DM P:   Monitor glu on chem panels Initiate SSI for glu > 180  NEUROLOGIC A:   Severe delirium EtOH abuse Wife confirms active alcohol abuse  immediately PTA P:   Cont propofol infusion Cont scheduled lorazepam Cont PRN fentanyl Daily WUA  I have personally obtained a history, examined the patient, evaluated laboratory and imaging results, formulated the assessment and plan and placed orders. CRITICAL CARE: The patient is critically ill with multiple organ systems failure and requires high complexity decision making for assessment and support, frequent evaluation and titration of therapies, application of advanced monitoring technologies and extensive interpretation of multiple databases. Critical Care Time devoted to patient care services described in this note is 35 minutes.   Merton Border, MD ; [facility] [phone].  After 5:30 PM or weekends, call [phone]   05/08/2013, 4:35 PM

## 2013-05-08 NOTE — Significant Event (Signed)
Patient has passed SBT. Lethargic and not F/C but has effective cough. Will extubate  Merton Border, MD ; [facility] [phone].  After 5:30 PM or weekends, call [phone]

## 2013-05-08 NOTE — Procedures (Signed)
Extubation Procedure Note  Patient Details:   Name: Joseph Hawkins DOB: 11/16/1934 MRN: [id number]   Airway Documentation:  Airway 8 mm (Active)  Secured at (cm) 23 cm 05/08/2013 12:15 PM  Measured From Lips 05/08/2013 12:15 PM  Secured Location Left 05/08/2013 12:15 PM  Secured By [employer] 05/08/2013 12:15 PM  Tube Holder Repositioned Yes 05/08/2013 12:15 PM  Cuff Pressure (cm H2O) 28 cm H2O 05/08/2013  3:39 AM  Site Condition Dry 05/08/2013  8:00 AM    Evaluation  O2 sats: stable throughout and currently acceptable Complications: No apparent complications Patient did tolerate procedure well. Bilateral Breath Sounds: Rhonchi Suctioning: Airway  Prior to extubation: pt suctioned orally and via ETT, positive cuff leak noted.  Post-extubation:  No stridor noted.  Pt non-verbal at this time.  Luvenia Redden [place] 05/08/2013, 6:26 PM

## 2013-05-09 ENCOUNTER — Inpatient Hospital Stay (HOSPITAL_COMMUNITY): Payer: Medicare Other

## 2013-05-09 LAB — CBC
HCT: 29 % — ABNORMAL LOW (ref 39.0–52.0)
HEMATOCRIT: 28.4 % — AB (ref 39.0–52.0)
HEMOGLOBIN: 9.3 g/dL — AB (ref 13.0–17.0)
Hemoglobin: 9.6 g/dL — ABNORMAL LOW (ref 13.0–17.0)
MCH: 29.2 pg (ref 26.0–34.0)
MCH: 29.6 pg (ref 26.0–34.0)
MCHC: 32.7 g/dL (ref 30.0–36.0)
MCHC: 33.1 g/dL (ref 30.0–36.0)
MCV: 89 fL (ref 78.0–100.0)
MCV: 89.5 fL (ref 78.0–100.0)
Platelets: 86 10*3/uL — ABNORMAL LOW (ref 150–400)
Platelets: 76 10*3/uL — ABNORMAL LOW (ref 150–400)
RBC: 3.19 MIL/uL — ABNORMAL LOW (ref 4.22–5.81)
RBC: 3.24 MIL/uL — AB (ref 4.22–5.81)
RDW: 19.3 % — ABNORMAL HIGH (ref 11.5–15.5)
RDW: 19.7 % — AB (ref 11.5–15.5)
WBC: 10.4 10*3/uL (ref 4.0–10.5)
WBC: 10.7 10*3/uL — ABNORMAL HIGH (ref 4.0–10.5)

## 2013-05-09 LAB — GLUCOSE, CAPILLARY
GLUCOSE-CAPILLARY: 109 mg/dL — AB (ref 70–99)
GLUCOSE-CAPILLARY: 93 mg/dL (ref 70–99)
GLUCOSE-CAPILLARY: 96 mg/dL (ref 70–99)
Glucose-Capillary: 106 mg/dL — ABNORMAL HIGH (ref 70–99)
Glucose-Capillary: 119 mg/dL — ABNORMAL HIGH (ref 70–99)
Glucose-Capillary: 125 mg/dL — ABNORMAL HIGH (ref 70–99)

## 2013-05-09 LAB — COMPREHENSIVE METABOLIC PANEL
ALK PHOS: 173 U/L — AB (ref 39–117)
ALT: 20 U/L (ref 0–53)
AST: 56 U/L — ABNORMAL HIGH (ref 0–37)
Albumin: 2 g/dL — ABNORMAL LOW (ref 3.5–5.2)
BUN: 23 mg/dL (ref 6–23)
CALCIUM: 7.7 mg/dL — AB (ref 8.4–10.5)
CO2: 17 meq/L — AB (ref 19–32)
Chloride: 114 mEq/L — ABNORMAL HIGH (ref 96–112)
Creatinine, Ser: 1.07 mg/dL (ref 0.50–1.35)
GFR, EST AFRICAN AMERICAN: 75 mL/min — AB (ref >90)
GFR, EST NON AFRICAN AMERICAN: 65 mL/min — AB (ref >90)
GLUCOSE: 122 mg/dL — AB (ref 70–99)
Potassium: 4.3 mEq/L (ref 3.7–5.3)
Sodium: 141 mEq/L (ref 137–147)
TOTAL PROTEIN: 5.6 g/dL — AB (ref 6.0–8.3)
Total Bilirubin: 2.4 mg/dL — ABNORMAL HIGH (ref 0.3–1.2)

## 2013-05-09 LAB — PROTIME-INR
INR: 1.41 (ref 0.00–1.49)
PROTHROMBIN TIME: 16.9 s — AB (ref 11.6–15.2)

## 2013-05-09 LAB — TRIGLYCERIDES: Triglycerides: 91 mg/dL (ref <150)

## 2013-05-09 MED ORDER — IOHEXOL 300 MG/ML  SOLN
100.0000 mL | Freq: Once | INTRAMUSCULAR | Status: AC | PRN
Start: 2013-05-09 — End: 2013-05-09
  Administered 2013-05-09: 100 mL via INTRAVENOUS

## 2013-05-09 MED ORDER — LABETALOL HCL 5 MG/ML IV SOLN
10.0000 mg | INTRAVENOUS | Status: DC | PRN
  Administered 2013-05-11: 10 mg via INTRAVENOUS
  Filled 2013-05-09: qty 4

## 2013-05-09 MED ORDER — PANTOPRAZOLE SODIUM 40 MG IV SOLR
40.0000 mg | INTRAVENOUS | Status: DC
  Administered 2013-05-10 – 2013-05-15 (×6): 40 mg via INTRAVENOUS
  Filled 2013-05-09 (×7): qty 40

## 2013-05-09 MED ORDER — LACTULOSE 10 GM/15ML PO SOLN
30.0000 g | Freq: Three times a day (TID) | ORAL | Status: DC
  Administered 2013-05-10 – 2013-05-11 (×6): 30 g via ORAL
  Filled 2013-05-09 (×7): qty 45

## 2013-05-09 NOTE — Progress Notes (Signed)
Subjective: Patient with alcoholic cirrhosis and history of esophageal varices s/p banding and now with re-bleed secondary to gastric varices s/p BRTO 05/07/13. Patient extubated 05/08/13.  Objective: Physical Exam: BP 123/59  Pulse 69  Temp(Src) 100 F (37.8 C) (Oral)  Resp 23  Ht 6\' 1"  (1.854 m)  Wt 199 lb 15.3 oz (90.7 kg)  BMI 26.39 kg/m2  SpO2 100%  General: Patient does not follow commands or open his eyes. Heart: RRR without M/G/R Lungs: CTA bilaterally Abd: Soft, ND, NT, (+) BS, no fluid wave Ext: Right access site dressing C/D/I, no signs of bleeding or hematoma, DP intact bilaterally.   Labs: CBC  Recent Labs  05/08/13 0410 05/09/13 0500  WBC 8.0 10.4  HGB 8.8* 9.3*  HCT 26.1* 28.4*  PLT 72* 86*   BMET  Recent Labs  05/08/13 0410 05/09/13 0500  NA 141 141  K 4.3 4.3  CL 114* 114*  CO2 15* 17*  GLUCOSE 161* 122*  BUN 34* 23  CREATININE 1.13 1.07  CALCIUM 7.3* 7.7*   LFT  Recent Labs  05/09/13 0500  PROT 5.6*  ALBUMIN 2.0*  AST 56*  ALT 20  ALKPHOS 173*  BILITOT 2.4*   PT/INR  Recent Labs  05/08/13 0410 05/09/13 0500  LABPROT 17.7* 16.9*  INR 1.50* 1.41     Studies/Results: Ir Angiogram Selective Each Additional Vessel  05/08/2013   CLINICAL DATA:  79 year old male with cirrhosis complicated by bleeding esophageal and gastric varices. Patient underwent recent EGD and banding of varices x4 but developed recurrent large volume spontaneous hemoptysis requiring transfer to [facility]. Ultimately, the patient was intubated for altered mental status likely due to hepatic encephalopathy. Patient is a candidate for balloon-occluded retrograde trans venous obliteration (BRTO) of his large gastric varices.  EXAM: ADDITIONAL ARTERIOGRAPHY; IR EMBO VENOUS NOT HEMORR HEMANG INC GUIDE ROADMAPPING; ARTERIOGRAPHY; IR ULTRASOUND GUIDANCE VASC ACCESS RIGHT; LEFT RENAL VENOGRAPHY  Date: 05/08/2013  PROCEDURE: 1. Ultrasound-guided puncture of the right  common femoral vein 2. Catheterization of the left renal vein with left renal venogram 3. Catheterization of the common trunk of the left adrenal pain and gastric renal shunt with venogram 4. Catheterization of the left inferior phrenic pain with venogram 5. Catheterization of the gastric renal shunt 6. Balloon occluded retrograde venogram of the-year renal shunt an gastric varix 7. Balloon occluded retrograde transvenous obliteration of gastric varix 8. Coil embolization of the proximal gastric renal shunt Interventional Radiologist:  Criselda Peaches, MD  ANESTHESIA/SEDATION: General anesthesia was provided by the anesthesiology service.  FLUOROSCOPY TIME:  15 min 42 seconds  CONTRAST:  28mL OMNIPAQUE IOHEXOL 300 MG/ML  SOLN  TECHNIQUE: Informed consent was obtained from the patient following explanation of the procedure, risks, benefits and alternatives. The patient understands, agrees and consents for the procedure. All questions were addressed. A time out was performed.  Maximal barrier sterile technique utilized including caps, mask, sterile gowns, sterile gloves, large sterile drape, hand hygiene, and Betadine skin prep.  The right groin was interrogated with ultrasound. The common femoral vein was found to be widely patent. There is an existing right common femoral central venous catheter. Local anesthesia was attained by infiltration with 1% lidocaine. A small dermatotomy was made. Under real-time sonographic guidance, the right common femoral vein was punctured with a 21 gauge micropuncture needle. An image was obtained and stored for the medical record. With the AV transitional 4 French micro sheath, an Amplatz wire was advanced into the IVC. A 10 [place]  TIPS sheath was then advanced over the wire and into the IVC. A C2 Cobra catheter and Bentson wire were used to catheterize the left renal vein. A left renal venogram was performed. 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 No significant  reflux down the left gonadal vein.  A Rosen wire was advanced into the inferior pole renal vein. A cobra catheter was then removed. Its sheath was advanced into the renal vein. A DSA run was performed identifying the origin of the gastric renal shunt. An angled catheter was advanced coaxially through the TIPS sheath a glidewire used to select the left inferior phrenic vein. An inferior phrenic venogram was performed. No significant communication with the gastric varices.  The catheter was then brought back into the main body of the gastric renal shunt. At and Amplatz wire was advanced to the gastric renal shunt negative sheath advanced over the wire into the proximal shunt. An 11 mm fossa scientific standard balloon occluded catheter was advanced over the wire. The balloon was inflated and core against a web in the proximal gastric renal shunt. A balloon occluded retrograde venogram was performed of the gastric renal shunts. The gastric varix was successfully identified. The eat a fair its posterior gastric vein was also successfully identified. The balloon was deflated and contrast allow the washout. A high-flow Renegade micro catheter was then advanced over a fat 60 wire deep into the gastric renal shunt. A final test inflation of the occlusion balloon was performed. Contrast is nicely static within the gastric varix and CT renal shunt. No significant gait vessels requiring embolization.  Foam sclerosus was then mixed in the standard 3:2:1 ratio of air to sodium tetradecyl sulfate to lipiodol. Approximately 15 mm of foam sclerosant was injected completely filling the gastric varix,the gastro renal shunt and extending into the proximal aspect of the posterior gastric vein. This represents approximately 5.5 mL of STS.  The proximal aspect of the gastro renal shunt just distal to the occlusion balloon was then coil embolized with 0.035 detachable coils. Two 20 x 400 mm interlock coils followed by a 15 x 150 and a 12  x 150 coil. Due to an emergent case the follow, the occlusion balloon was left inflated and she lesion balloon were stored right groin. The patient was returned to the ICU in stable condition. He will be brought back to Interventional Radiology in the morning for balloon deflation under fluoroscopic visualization.  IMPRESSION: 1. Technically successful balloon occluded retrograde trans venous obliteration of gastric varices. 2. Coil embolization of the proximal aspect of the efferent gastrorenal shunt. 3. Occlusion balloon left in place to secure the foam sclerosant overnight. The patient will return to interventional radiology in the morning or balloon takedown under fluoroscopic visualization. Patient will require continued surveillance for recurrence of high risk esophageal varices. I will communicate with the patient's primary Gastroenterologist to ensure follow-up.  Signed,  Criselda Peaches, MD  [facility]  [facility] Radiology   Electronically Signed   By: Jacqulynn Cadet M.D.   On: 05/08/2013 17:28   Ir Angiogram Selective Each Additional Vessel  05/08/2013   CLINICAL DATA:  79 year old male with cirrhosis complicated by bleeding esophageal and gastric varices. Patient underwent recent EGD and banding of varices x4 but developed recurrent large volume spontaneous hemoptysis requiring transfer to [facility]. Ultimately, the patient was intubated for altered mental status likely due to hepatic encephalopathy. Patient is a candidate for balloon-occluded retrograde trans venous obliteration (  BRTO) of his large gastric varices.  EXAM: ADDITIONAL ARTERIOGRAPHY; IR EMBO VENOUS NOT HEMORR HEMANG INC GUIDE ROADMAPPING; ARTERIOGRAPHY; IR ULTRASOUND GUIDANCE VASC ACCESS RIGHT; LEFT RENAL VENOGRAPHY  Date: 05/08/2013  PROCEDURE: 1. Ultrasound-guided puncture of the right common femoral vein 2. Catheterization of the left renal vein with left renal venogram 3.  Catheterization of the common trunk of the left adrenal pain and gastric renal shunt with venogram 4. Catheterization of the left inferior phrenic pain with venogram 5. Catheterization of the gastric renal shunt 6. Balloon occluded retrograde venogram of the-year renal shunt an gastric varix 7. Balloon occluded retrograde transvenous obliteration of gastric varix 8. Coil embolization of the proximal gastric renal shunt Interventional Radiologist:  Criselda Peaches, MD  ANESTHESIA/SEDATION: General anesthesia was provided by the anesthesiology service.  FLUOROSCOPY TIME:  15 min 42 seconds  CONTRAST:  88mL OMNIPAQUE IOHEXOL 300 MG/ML  SOLN  TECHNIQUE: Informed consent was obtained from the patient following explanation of the procedure, risks, benefits and alternatives. The patient understands, agrees and consents for the procedure. All questions were addressed. A time out was performed.  Maximal barrier sterile technique utilized including caps, mask, sterile gowns, sterile gloves, large sterile drape, hand hygiene, and Betadine skin prep.  The right groin was interrogated with ultrasound. The common femoral vein was found to be widely patent. There is an existing right common femoral central venous catheter. Local anesthesia was attained by infiltration with 1% lidocaine. A small dermatotomy was made. Under real-time sonographic guidance, the right common femoral vein was punctured with a 21 gauge micropuncture needle. An image was obtained and stored for the medical record. With the AV transitional 4 French micro sheath, an Amplatz wire was advanced into the IVC. A 10 French TIPS sheath was then advanced over the wire and into the IVC. A C2 Cobra catheter and Bentson wire were used to catheterize the left renal vein. A left renal venogram was performed. 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 No significant reflux down the left gonadal vein.  A Rosen wire was advanced into the inferior pole renal  vein. A cobra catheter was then removed. Its sheath was advanced into the renal vein. A DSA run was performed identifying the origin of the gastric renal shunt. An angled catheter was advanced coaxially through the TIPS sheath a glidewire used to select the left inferior phrenic vein. An inferior phrenic venogram was performed. No significant communication with the gastric varices.  The catheter was then brought back into the main body of the gastric renal shunt. At and Amplatz wire was advanced to the gastric renal shunt negative sheath advanced over the wire into the proximal shunt. An 11 mm fossa scientific standard balloon occluded catheter was advanced over the wire. The balloon was inflated and core against a web in the proximal gastric renal shunt. A balloon occluded retrograde venogram was performed of the gastric renal shunts. The gastric varix was successfully identified. The eat a fair its posterior gastric vein was also successfully identified. The balloon was deflated and contrast allow the washout. A high-flow Renegade micro catheter was then advanced over a fat 60 wire deep into the gastric renal shunt. A final test inflation of the occlusion balloon was performed. Contrast is nicely static within the gastric varix and CT renal shunt. No significant gait vessels requiring embolization.  Foam sclerosus was then mixed in the standard 3:2:1 ratio of air to sodium tetradecyl sulfate to lipiodol. Approximately 15 mm of foam sclerosant  was injected completely filling the gastric varix,the gastro renal shunt and extending into the proximal aspect of the posterior gastric vein. This represents approximately 5.5 mL of STS.  The proximal aspect of the gastro renal shunt just distal to the occlusion balloon was then coil embolized with 0.035 detachable coils. Two 20 x 400 mm interlock coils followed by a 15 x 150 and a 12 x 150 coil. Due to an emergent case the follow, the occlusion balloon was left inflated and  she lesion balloon were stored right groin. The patient was returned to the ICU in stable condition. He will be brought back to Interventional Radiology in the morning for balloon deflation under fluoroscopic visualization.  IMPRESSION: 1. Technically successful balloon occluded retrograde trans venous obliteration of gastric varices. 2. Coil embolization of the proximal aspect of the efferent gastrorenal shunt. 3. Occlusion balloon left in place to secure the foam sclerosant overnight. The patient will return to interventional radiology in the morning or balloon takedown under fluoroscopic visualization. Patient will require continued surveillance for recurrence of high risk esophageal varices. I will communicate with the patient's primary Gastroenterologist to ensure follow-up.  Signed,  Criselda Peaches, MD  [facility]  [facility] Radiology   Electronically Signed   By: Jacqulynn Cadet M.D.   On: 05/08/2013 17:28   Ir Venogram Renal Uni Left  05/08/2013   CLINICAL DATA:  79 year old male with cirrhosis complicated by bleeding esophageal and gastric varices. Patient underwent recent EGD and banding of varices x4 but developed recurrent large volume spontaneous hemoptysis requiring transfer to [facility]. Ultimately, the patient was intubated for altered mental status likely due to hepatic encephalopathy. Patient is a candidate for balloon-occluded retrograde trans venous obliteration (BRTO) of his large gastric varices.  EXAM: ADDITIONAL ARTERIOGRAPHY; IR EMBO VENOUS NOT HEMORR HEMANG INC GUIDE ROADMAPPING; ARTERIOGRAPHY; IR ULTRASOUND GUIDANCE VASC ACCESS RIGHT; LEFT RENAL VENOGRAPHY  Date: 05/08/2013  PROCEDURE: 1. Ultrasound-guided puncture of the right common femoral vein 2. Catheterization of the left renal vein with left renal venogram 3. Catheterization of the common trunk of the left adrenal pain and gastric renal shunt with venogram 4. Catheterization  of the left inferior phrenic pain with venogram 5. Catheterization of the gastric renal shunt 6. Balloon occluded retrograde venogram of the-year renal shunt an gastric varix 7. Balloon occluded retrograde transvenous obliteration of gastric varix 8. Coil embolization of the proximal gastric renal shunt Interventional Radiologist:  Criselda Peaches, MD  ANESTHESIA/SEDATION: General anesthesia was provided by the anesthesiology service.  FLUOROSCOPY TIME:  15 min 42 seconds  CONTRAST:  88mL OMNIPAQUE IOHEXOL 300 MG/ML  SOLN  TECHNIQUE: Informed consent was obtained from the patient following explanation of the procedure, risks, benefits and alternatives. The patient understands, agrees and consents for the procedure. All questions were addressed. A time out was performed.  Maximal barrier sterile technique utilized including caps, mask, sterile gowns, sterile gloves, large sterile drape, hand hygiene, and Betadine skin prep.  The right groin was interrogated with ultrasound. The common femoral vein was found to be widely patent. There is an existing right common femoral central venous catheter. Local anesthesia was attained by infiltration with 1% lidocaine. A small dermatotomy was made. Under real-time sonographic guidance, the right common femoral vein was punctured with a 21 gauge micropuncture needle. An image was obtained and stored for the medical record. With the AV transitional 4 French micro sheath, an Amplatz wire was advanced into the IVC. A 10 [place] TIPS  sheath was then advanced over the wire and into the IVC. A C2 Cobra catheter and Bentson wire were used to catheterize the left renal vein. A left renal venogram was performed. 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 No significant reflux down the left gonadal vein.  A Rosen wire was advanced into the inferior pole renal vein. A cobra catheter was then removed. Its sheath was advanced into the renal vein. A DSA run was performed  identifying the origin of the gastric renal shunt. An angled catheter was advanced coaxially through the TIPS sheath a glidewire used to select the left inferior phrenic vein. An inferior phrenic venogram was performed. No significant communication with the gastric varices.  The catheter was then brought back into the main body of the gastric renal shunt. At and Amplatz wire was advanced to the gastric renal shunt negative sheath advanced over the wire into the proximal shunt. An 11 mm fossa scientific standard balloon occluded catheter was advanced over the wire. The balloon was inflated and core against a web in the proximal gastric renal shunt. A balloon occluded retrograde venogram was performed of the gastric renal shunts. The gastric varix was successfully identified. The eat a fair its posterior gastric vein was also successfully identified. The balloon was deflated and contrast allow the washout. A high-flow Renegade micro catheter was then advanced over a fat 60 wire deep into the gastric renal shunt. A final test inflation of the occlusion balloon was performed. Contrast is nicely static within the gastric varix and CT renal shunt. No significant gait vessels requiring embolization.  Foam sclerosus was then mixed in the standard 3:2:1 ratio of air to sodium tetradecyl sulfate to lipiodol. Approximately 15 mm of foam sclerosant was injected completely filling the gastric varix,the gastro renal shunt and extending into the proximal aspect of the posterior gastric vein. This represents approximately 5.5 mL of STS.  The proximal aspect of the gastro renal shunt just distal to the occlusion balloon was then coil embolized with 0.035 detachable coils. Two 20 x 400 mm interlock coils followed by a 15 x 150 and a 12 x 150 coil. Due to an emergent case the follow, the occlusion balloon was left inflated and she lesion balloon were stored right groin. The patient was returned to the ICU in stable condition. He will  be brought back to Interventional Radiology in the morning for balloon deflation under fluoroscopic visualization.  IMPRESSION: 1. Technically successful balloon occluded retrograde trans venous obliteration of gastric varices. 2. Coil embolization of the proximal aspect of the efferent gastrorenal shunt. 3. Occlusion balloon left in place to secure the foam sclerosant overnight. The patient will return to interventional radiology in the morning or balloon takedown under fluoroscopic visualization. Patient will require continued surveillance for recurrence of high risk esophageal varices. I will communicate with the patient's primary Gastroenterologist to ensure follow-up.  Signed,  Criselda Peaches, MD  [facility]  [facility] Radiology   Electronically Signed   By: Jacqulynn Cadet M.D.   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 Patient underwent recent EGD and banding of varices x4 but developed recurrent large volume spontaneous hemoptysis requiring transfer to [facility]. Ultimately, the patient was intubated for altered mental status likely due to hepatic encephalopathy. Patient is a candidate for balloon-occluded retrograde trans venous obliteration (BRTO) of  his large gastric varices.  EXAM: ADDITIONAL ARTERIOGRAPHY; IR EMBO VENOUS NOT HEMORR HEMANG INC GUIDE ROADMAPPING; ARTERIOGRAPHY; IR ULTRASOUND GUIDANCE VASC ACCESS RIGHT; LEFT RENAL VENOGRAPHY  Date: 05/08/2013  PROCEDURE: 1. Ultrasound-guided puncture of the right common femoral vein 2. Catheterization of the left renal vein with left renal venogram 3. Catheterization of the common trunk of the left adrenal pain and gastric renal shunt with venogram 4. Catheterization of the left inferior phrenic pain with venogram 5. Catheterization of the gastric renal shunt 6. Balloon  occluded retrograde venogram of the-year renal shunt an gastric varix 7. Balloon occluded retrograde transvenous obliteration of gastric varix 8. Coil embolization of the proximal gastric renal shunt Interventional Radiologist:  Criselda Peaches, MD  ANESTHESIA/SEDATION: General anesthesia was provided by the anesthesiology service.  FLUOROSCOPY TIME:  15 min 42 seconds  CONTRAST:  88mL OMNIPAQUE IOHEXOL 300 MG/ML  SOLN  TECHNIQUE: Informed consent was obtained from the patient following explanation of the procedure, risks, benefits and alternatives. The patient understands, agrees and consents for the procedure. All questions were addressed. A time out was performed.  Maximal barrier sterile technique utilized including caps, mask, sterile gowns, sterile gloves, large sterile drape, hand hygiene, and Betadine skin prep.  The right groin was interrogated with ultrasound. The common femoral vein was found to be widely patent. There is an existing right common femoral central venous catheter. Local anesthesia was attained by infiltration with 1% lidocaine. A small dermatotomy was made. Under real-time sonographic guidance, the right common femoral vein was punctured with a 21 gauge micropuncture needle. An image was obtained and stored for the medical record. With the AV transitional 4 French micro sheath, an Amplatz wire was advanced into the IVC. A 10 French TIPS sheath was then advanced over the wire and into the IVC. A C2 Cobra catheter and Bentson wire were used to catheterize the left renal vein. A left renal venogram was performed. 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 No significant reflux down the left gonadal vein.  A Rosen wire was advanced into the inferior pole renal vein. A cobra catheter was then removed. Its sheath was advanced into the renal vein. A DSA run was performed identifying the origin of the gastric renal shunt. An angled catheter was advanced coaxially through the TIPS  sheath a glidewire used to select the left inferior phrenic vein. An inferior phrenic venogram was performed. No significant communication with the gastric varices.  The catheter was then brought back into the main body of the gastric renal shunt. At and Amplatz wire was advanced to the gastric renal shunt negative sheath advanced over the wire into the proximal shunt. An 11 mm fossa scientific standard balloon occluded catheter was advanced over the wire. The balloon was inflated and core against a web in the proximal gastric renal shunt. A balloon occluded retrograde venogram was performed of the gastric renal shunts. The gastric varix was successfully identified. The eat a fair its posterior gastric vein was also successfully identified. The balloon was deflated and contrast allow the washout. A high-flow Renegade micro catheter was then advanced over a fat 60 wire deep into the gastric renal shunt. A final test inflation of the occlusion balloon was performed. Contrast is nicely static within the gastric varix and CT renal shunt. No significant gait vessels requiring embolization.  Foam sclerosus was then mixed in the standard 3:2:1 ratio of air to sodium tetradecyl sulfate to lipiodol. Approximately 15 mm of foam sclerosant was injected  completely filling the gastric varix,the gastro renal shunt and extending into the proximal aspect of the posterior gastric vein. This represents approximately 5.5 mL of STS.  The proximal aspect of the gastro renal shunt just distal to the occlusion balloon was then coil embolized with 0.035 detachable coils. Two 20 x 400 mm interlock coils followed by a 15 x 150 and a 12 x 150 coil. Due to an emergent case the follow, the occlusion balloon was left inflated and she lesion balloon were stored right groin. The patient was returned to the ICU in stable condition. He will be brought back to Interventional Radiology in the morning for balloon deflation under fluoroscopic  visualization.  IMPRESSION: 1. Technically successful balloon occluded retrograde trans venous obliteration of gastric varices. 2. Coil embolization of the proximal aspect of the efferent gastrorenal shunt. 3. Occlusion balloon left in place to secure the foam sclerosant overnight. The patient will return to interventional radiology in the morning or balloon takedown under fluoroscopic visualization. Patient will require continued surveillance for recurrence of high risk esophageal varices. I will communicate with the patient's primary Gastroenterologist to ensure follow-up.  Signed,  Criselda Peaches, MD  [facility]  [facility]   Electronically Signed   By: Jacqulynn Cadet M.D.   On: 05/08/2013 17:28   Ir Fluoro Rm 30-60 Min  05/08/2013   CLINICAL DATA:  Bleeding gastric varices 456.8 , post Balloon-occluded retrograde transvenous obliteration (BRTO) of gastric varices. Right common femoral vein 5F sheath and balloon occlusion catheter still in place  EXAM: IR FLOURO RM 0-60 MIN  MEDICATIONS: None  CONTRAST:  None  FLUOROSCOPY TIME:  48 sec  PROCEDURE: The previously placed right femoral sheath and catheter were prepped and draped in usual sterile fashion. A Bentson guidewire was advanced through the catheter and the catheter was gently removed. The long venous sheath was then also with drawn, with no change in morphology of the embolization material. The catheter was removed and hemostasis achieved at the site with manual compression. The patient tolerated the procedure well.  Complications:  No immediate complication  FINDINGS: Fluoroscopic inspection demonstrated stable appearance of the embolization material and coils in the left upper abdomen. The balloon of the occlusion balloon catheter was noted to no longer be inflated although the catheter remained in stable in position. A spot radiograph of the chest shows no evidence of migration of embolic  material. After removal of the occlusion catheter, Embolization material and coils remain stable in position.  IMPRESSION: 1. Stable left upper abdominal coils and embolization material. The occlusion catheter and venous sheath were removed.   Electronically Signed   By: Arne Cleveland M.D.   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 Patient underwent recent EGD and banding of varices x4 but developed recurrent large volume spontaneous hemoptysis requiring transfer to [facility]. Ultimately, the patient was intubated for altered mental status likely due to hepatic encephalopathy. Patient is a candidate for balloon-occluded retrograde trans venous obliteration (BRTO) of his large gastric varices.  EXAM: ADDITIONAL ARTERIOGRAPHY; IR EMBO VENOUS NOT HEMORR HEMANG INC GUIDE ROADMAPPING; ARTERIOGRAPHY; IR ULTRASOUND GUIDANCE VASC ACCESS RIGHT; LEFT RENAL VENOGRAPHY  Date: 05/08/2013  PROCEDURE: 1. Ultrasound-guided puncture of the right common femoral vein 2. Catheterization of the left renal vein with left renal venogram 3. Catheterization of the common trunk of the  left adrenal pain and gastric renal shunt with venogram 4. Catheterization of the left inferior phrenic pain with venogram 5. Catheterization of the gastric renal shunt 6. Balloon occluded retrograde venogram of the-year renal shunt an gastric varix 7. Balloon occluded retrograde transvenous obliteration of gastric varix 8. Coil embolization of the proximal gastric renal shunt Interventional Radiologist:  Criselda Peaches, MD  ANESTHESIA/SEDATION: General anesthesia was provided by the anesthesiology service.  FLUOROSCOPY TIME:  15 min 42 seconds  CONTRAST:  45mL OMNIPAQUE IOHEXOL 300 MG/ML  SOLN  TECHNIQUE: Informed consent was obtained from the patient following explanation of the procedure, risks, benefits and  alternatives. The patient understands, agrees and consents for the procedure. All questions were addressed. A time out was performed.  Maximal barrier sterile technique utilized including caps, mask, sterile gowns, sterile gloves, large sterile drape, hand hygiene, and Betadine skin prep.  The right groin was interrogated with ultrasound. The common femoral vein was found to be widely patent. There is an existing right common femoral central venous catheter. Local anesthesia was attained by infiltration with 1% lidocaine. A small dermatotomy was made. Under real-time sonographic guidance, the right common femoral vein was punctured with a 21 gauge micropuncture needle. An image was obtained and stored for the medical record. With the AV transitional 4 French micro sheath, an Amplatz wire was advanced into the IVC. A 10 French TIPS sheath was then advanced over the wire and into the IVC. A C2 Cobra catheter and Bentson wire were used to catheterize the left renal vein. A left renal venogram was performed. 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 No significant reflux down the left gonadal vein.  A Rosen wire was advanced into the inferior pole renal vein. A cobra catheter was then removed. Its sheath was advanced into the renal vein. A DSA run was performed identifying the origin of the gastric renal shunt. An angled catheter was advanced coaxially through the TIPS sheath a glidewire used to select the left inferior phrenic vein. An inferior phrenic venogram was performed. No significant communication with the gastric varices.  The catheter was then brought back into the main body of the gastric renal shunt. At and Amplatz wire was advanced to the gastric renal shunt negative sheath advanced over the wire into the proximal shunt. An 11 mm fossa scientific standard balloon occluded catheter was advanced over the wire. The balloon was inflated and core against a web in the proximal gastric renal shunt. A  balloon occluded retrograde venogram was performed of the gastric renal shunts. The gastric varix was successfully identified. The eat a fair its posterior gastric vein was also successfully identified. The balloon was deflated and contrast allow the washout. A high-flow Renegade micro catheter was then advanced over a fat 60 wire deep into the gastric renal shunt. A final test inflation of the occlusion balloon was performed. Contrast is nicely static within the gastric varix and CT renal shunt. No significant gait vessels requiring embolization.  Foam sclerosus was then mixed in the standard 3:2:1 ratio of air to sodium tetradecyl sulfate to lipiodol. Approximately 15 mm of foam sclerosant was injected completely filling the gastric varix,the gastro renal shunt and extending into the proximal aspect of the posterior gastric vein. This represents approximately 5.5 mL of STS.  The proximal aspect of the gastro renal shunt just distal to the occlusion balloon was then coil embolized with 0.035 detachable coils. Two 20 x 400 mm interlock coils followed by a 15  x 150 and a 12 x 150 coil. Due to an emergent case the follow, the occlusion balloon was left inflated and she lesion balloon were stored right groin. The patient was returned to the ICU in stable condition. He will be brought back to Interventional Radiology in the morning for balloon deflation under fluoroscopic visualization.  IMPRESSION: 1. Technically successful balloon occluded retrograde trans venous obliteration of gastric varices. 2. Coil embolization of the proximal aspect of the efferent gastrorenal shunt. 3. Occlusion balloon left in place to secure the foam sclerosant overnight. The patient will return to interventional radiology in the morning or balloon takedown under fluoroscopic visualization. Patient will require continued surveillance for recurrence of high risk esophageal varices. I will communicate with the patient's primary  Gastroenterologist to ensure follow-up.  Signed,  Criselda Peaches, MD  [facility]  [facility]   Electronically Signed   By: Jacqulynn Cadet M.D.   On: 05/08/2013 17:28   Dg Chest Port 1 View  05/09/2013   CLINICAL DATA:  Extubated  EXAM: PORTABLE CHEST - 1 VIEW  COMPARISON:  Prior chest x-ray 05/08/2013  FINDINGS: Interval extubation. Left subclavian approach central venous catheter remains in unchanged position with the tip in the upper SVC. Inspiratory volumes are lower resulting in increased vascular crowding and bibasilar atelectasis. There may be slightly increased pulmonary vascular congestion on today's exam. Stable cardiac and mediastinal contours. Metallic coils in radiopaque sclerosed sent material in the left upper quadrant consistent with recent BRTO procedure. No acute osseous abnormality.  IMPRESSION: 1. Lower inspiratory volumes following extubation results in vascular crowding and increased bibasilar atelectasis. 2. Perhaps slightly increased pulmonary vascular congestion. 3. Stable position of left subclavian central venous catheter.   Electronically Signed   By: Jacqulynn Cadet M.D.   On: 05/09/2013 08:18   Dg Chest Port 1 View  05/08/2013   CLINICAL DATA:  Intubation.  EXAM: PORTABLE CHEST - 1 VIEW  COMPARISON:  Chest x-ray 05/06/2013.  FINDINGS: Endotracheal tube left central line in stable position. Stable cardiomegaly and pulmonary venous congestion. Mild interstitial prominence and left-sided pleural effusion. Mild congestive heart failure cannot be excluded. Persistent mild bibasilar atelectasis. No pneumothorax.  IMPRESSION: 1. Line and tube positions stable. 2. Mild congestive heart failure with mild interstitial pulmonary edema and small left pleural effusion. 3. Persistent mild bibasilar atelectasis.   Electronically Signed   By: Marcello Moores  Register   On: 05/08/2013 07:38   Ir Embo Venous Not [place]  05/08/2013   CLINICAL DATA:  79 year old male with cirrhosis complicated by bleeding esophageal and gastric varices. Patient underwent recent EGD and banding of varices x4 but developed recurrent large volume spontaneous hemoptysis requiring transfer to [facility]. Ultimately, the patient was intubated for altered mental status likely due to hepatic encephalopathy. Patient is a candidate for balloon-occluded retrograde trans venous obliteration (BRTO) of his large gastric varices.  EXAM: ADDITIONAL ARTERIOGRAPHY; IR EMBO VENOUS NOT HEMORR HEMANG INC GUIDE ROADMAPPING; ARTERIOGRAPHY; IR ULTRASOUND GUIDANCE VASC ACCESS RIGHT; LEFT RENAL VENOGRAPHY  Date: 05/08/2013  PROCEDURE: 1. Ultrasound-guided puncture of the right common femoral vein 2. Catheterization of the left renal vein with left renal venogram 3. Catheterization of the common trunk of the left adrenal pain and gastric renal shunt with venogram 4. Catheterization of the left inferior phrenic pain with venogram 5. Catheterization of the gastric renal shunt 6. Balloon occluded retrograde venogram of the-year renal shunt an gastric varix 7. Balloon occluded  retrograde transvenous obliteration of gastric varix 8. Coil embolization of the proximal gastric renal shunt Interventional Radiologist:  Criselda Peaches, MD  ANESTHESIA/SEDATION: General anesthesia was provided by the anesthesiology service.  FLUOROSCOPY TIME:  15 min 42 seconds  CONTRAST:  51mL OMNIPAQUE IOHEXOL 300 MG/ML  SOLN  TECHNIQUE: Informed consent was obtained from the patient following explanation of the procedure, risks, benefits and alternatives. The patient understands, agrees and consents for the procedure. All questions were addressed. A time out was performed.  Maximal barrier sterile technique utilized including caps, mask, sterile gowns, sterile gloves, large sterile drape, hand hygiene, and Betadine skin prep.  The right groin was interrogated with ultrasound.  The common femoral vein was found to be widely patent. There is an existing right common femoral central venous catheter. Local anesthesia was attained by infiltration with 1% lidocaine. A small dermatotomy was made. Under real-time sonographic guidance, the right common femoral vein was punctured with a 21 gauge micropuncture needle. An image was obtained and stored for the medical record. With the AV transitional 4 French micro sheath, an Amplatz wire was advanced into the IVC. A 10 French TIPS sheath was then advanced over the wire and into the IVC. A C2 Cobra catheter and Bentson wire were used to catheterize the left renal vein. A left renal venogram was performed. 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 No significant reflux down the left gonadal vein.  A Rosen wire was advanced into the inferior pole renal vein. A cobra catheter was then removed. Its sheath was advanced into the renal vein. A DSA run was performed identifying the origin of the gastric renal shunt. An angled catheter was advanced coaxially through the TIPS sheath a glidewire used to select the left inferior phrenic vein. An inferior phrenic venogram was performed. No significant communication with the gastric varices.  The catheter was then brought back into the main body of the gastric renal shunt. At and Amplatz wire was advanced to the gastric renal shunt negative sheath advanced over the wire into the proximal shunt. An 11 mm fossa scientific standard balloon occluded catheter was advanced over the wire. The balloon was inflated and core against a web in the proximal gastric renal shunt. A balloon occluded retrograde venogram was performed of the gastric renal shunts. The gastric varix was successfully identified. The eat a fair its posterior gastric vein was also successfully identified. The balloon was deflated and contrast allow the washout. A high-flow Renegade micro catheter was then advanced over a fat 60 wire deep into  the gastric renal shunt. A final test inflation of the occlusion balloon was performed. Contrast is nicely static within the gastric varix and CT renal shunt. No significant gait vessels requiring embolization.  Foam sclerosus was then mixed in the standard 3:2:1 ratio of air to sodium tetradecyl sulfate to lipiodol. Approximately 15 mm of foam sclerosant was injected completely filling the gastric varix,the gastro renal shunt and extending into the proximal aspect of the posterior gastric vein. This represents approximately 5.5 mL of STS.  The proximal aspect of the gastro renal shunt just distal to the occlusion balloon was then coil embolized with 0.035 detachable coils. Two 20 x 400 mm interlock coils followed by a 15 x 150 and a 12 x 150 coil. Due to an emergent case the follow, the occlusion balloon was left inflated and she lesion balloon were stored right groin. The patient was returned to the ICU in stable condition. He will  be brought back to Interventional Radiology in the morning for balloon deflation under fluoroscopic visualization.  IMPRESSION: 1. Technically successful balloon occluded retrograde trans venous obliteration of gastric varices. 2. Coil embolization of the proximal aspect of the efferent gastrorenal shunt. 3. Occlusion balloon left in place to secure the foam sclerosant overnight. The patient will return to interventional radiology in the morning or balloon takedown under fluoroscopic visualization. Patient will require continued surveillance for recurrence of high risk esophageal varices. I will communicate with the patient's primary Gastroenterologist to ensure follow-up.  Signed,  Criselda Peaches, MD  [facility]  [facility]iology   Electronically Signed   By: Jacqulynn Cadet M.D.   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  IR will follow.    LOS: 3 days    Hedy Jacob PA-C 05/09/2013 11:40 AM

## 2013-05-09 NOTE — Progress Notes (Signed)
PULMONARY / CRITICAL CARE MEDICINE  Name: Joseph Hawkins MRN: [id number] DOB: Aug 23, 1934    ADMISSION DATE:  05/06/2013 CONSULTATION DATE:  05/06/2013   REFERRING MD :  Selena Lesser PRIMARY SERVICE: PCCM  CHIEF COMPLAINT:  UGI Bleed  BRIEF PATIENT DESCRIPTION: 79 yo with alcoholic cirrhosis and varices presented to [facility] with hematemesis due to variceal bleed. Underwent EGD and banding of esophageal varices but had further hematemesis presumed to be due to gastric varices. Transferred to [facility] in consideration of IR procedure.  SIGNIFICANT EVENTS / STUDIES:  5/6  CTA abd >>> Gastric varices involving the gastric fundus and cardia. These varices are draining into the left renal vein and consistent with a gastrorenal shunt. No significant esophageal varices. 5/7  BRTO performed by IR  LINES / TUBES: R F Cordis 5/06 >>> 5/8 ETT 5/6 >>> 5/8 L [place] CVL 5/6 >>> L R AL 5/7 >>> 5/9  CULTURES:  ANTIBIOTICS: Ceftriaxone 5/6 >>>  SUBJECTIVE:  Remains obtunded.  VITAL SIGNS: Temp:  [98.1 F (36.7 C)-100 F (37.8 C)] 98.7 F (37.1 C) (05/09 1539) Pulse Rate:  [50-133] 69 (05/09 1700) Resp:  [18-29] 18 (05/09 1700) BP: (122-157)/(58-88) 148/79 mmHg (05/09 1700) SpO2:  [91 %-100 %] 100 % (05/09 1700) Arterial Line BP: (98-192)/(63-92) 165/75 mmHg (05/09 1400)  HEMODYNAMICS:   VENTILATOR SETTINGS:   INTAKE / OUTPUT: Intake/Output     05/08 0701 - 05/09 0700 05/09 0701 - 05/10 0700   I.V. (mL/kg) 3047 (33.6) 1000 (11)   Blood     IV Piggyback 100 50   Total Intake(mL/kg) 3147 (34.7) 1050 (11.6)   Urine (mL/kg/hr) 1660 (0.8) 150 (0.1)   Total Output 1660 150   Net +1487 +900          PHYSICAL EXAMINATION: General: In no distress Neuro: Obtunded  HEENT: Moist membranes Cardiovascular: Regular, no murmurs Lungs: CTAB Abdomen: Soft, non tender Ext: warm, no edema  LABS: CBC  Recent Labs Lab 05/07/13 0415 05/07/13 1627 05/08/13 0410 05/09/13 0500  WBC 10.9*  --  8.0 10.4  HGB  7.9* 7.1* 8.8* 9.3*  HCT 23.5* 21.0* 26.1* 28.4*  PLT 133*  --  72* 86*   Coag's  Recent Labs Lab 05/06/13 0117 05/07/13 0415 05/08/13 0410 05/09/13 0500  APTT 32 34  --   --   INR 2.00* 1.58* 1.50* 1.41   BMET  Recent Labs Lab 05/07/13 0415 05/07/13 1627 05/08/13 0410 05/09/13 0500  NA 142 144 141 141  K 4.4 4.3 4.3 4.3  CL 114*  --  114* 114*  CO2 18*  --  15* 17*  BUN 53*  --  34* 23  CREATININE 1.63*  --  1.13 1.07  GLUCOSE 163* 111* 161* 122*   Electrolytes  Recent Labs Lab 05/06/13 0117 05/06/13 0500 05/07/13 0415 05/08/13 0410 05/09/13 0500  CALCIUM 8.1* 8.2* 7.5* 7.3* 7.7*  MG 1.5 1.5  --   --   --   PHOS 3.8 4.2  --   --   --    Sepsis Markers No results found for this basename: LATICACIDVEN, PROCALCITON, O2SATVEN,  in the last 168 hours ABG  Recent Labs Lab 05/06/13 2104  PHART 7.349*  PCO2ART 31.8*  PO2ART 238.0*   Liver Enzymes  Recent Labs Lab 05/07/13 0415 05/08/13 0410 05/09/13 0500  AST 39* 58* 56*  ALT 17 20 20   ALKPHOS 171* 152* 173*  BILITOT 1.5* 1.7* 2.4*  ALBUMIN 2.0* 1.9* 2.0*   Cardiac Enzymes No results found for  this basename: TROPONINI, PROBNP,  in the last 168 hours Glucose  Recent Labs Lab 05/08/13 2029 05/09/13 0026 05/09/13 0408 05/09/13 0730 05/09/13 1216 05/09/13 1518  GLUCAP 111* 96 93 106* 119* 125*   IMAGING: Ir Fluoro Rm 30-60 Min  05/08/2013   CLINICAL DATA:  Bleeding gastric varices 456.8 , post Balloon-occluded retrograde transvenous obliteration (BRTO) of gastric varices. Right common femoral vein 61F sheath and balloon occlusion catheter still in place  EXAM: IR FLOURO RM 0-60 MIN  MEDICATIONS: None  CONTRAST:  None  FLUOROSCOPY TIME:  48 sec  PROCEDURE: The previously placed right femoral sheath and catheter were prepped and draped in usual sterile fashion. A Bentson guidewire was advanced through the catheter and the catheter was gently removed. The long venous sheath was then also with  drawn, with no change in morphology of the embolization material. The catheter was removed and hemostasis achieved at the site with manual compression. The patient tolerated the procedure well.  Complications:  No immediate complication  FINDINGS: Fluoroscopic inspection demonstrated stable appearance of the embolization material and coils in the left upper abdomen. The balloon of the occlusion balloon catheter was noted to no longer be inflated although the catheter remained in stable in position. A spot radiograph of the chest shows no evidence of migration of embolic material. After removal of the occlusion catheter, Embolization material and coils remain stable in position.  IMPRESSION: 1. Stable left upper abdominal coils and embolization material. The occlusion catheter and venous sheath were removed.   Electronically Signed   By: Arne Cleveland M.D.   On: 05/08/2013 13:52   Ct Abd Wo & W Cm  05/09/2013   CLINICAL DATA:  Post BRTO: Evaluate gastric varices and portal vein  EXAM: CT ABDOMEN WITHOUT AND WITH CONTRAST  TECHNIQUE: Multidetector CT imaging of the abdomen was performed following the standard protocol before and following the bolus administration of intravenous contrast.  CONTRAST:  149mL OMNIPAQUE IOHEXOL 300 MG/ML  SOLN  COMPARISON:  Prior CT abdomen/pelvis 05/06/2013  FINDINGS: Lower Chest: Small bilateral layering pleural effusions with associated bibasilar atelectasis. Stable mild cardiomegaly. Atherosclerotic calcifications noted in the coronary arteries. No pericardial effusion. Unremarkable visualized distal thoracic esophagus.  Abdomen: Interval development of small to moderate ascites. High attenuation sclerosing material noted throughout the gastric varices and gastro renal shunts. A small amount of contrast material extends from the gastro renal shunt into the left renal vein. On the coronal reformatted images the sclerosing layers along the superior aspect of the renal vein. This is  nonocclusive. The renal vein remains patent. No sclerosing identified within the splenic or portal vein. Sclerosant enters the proximal aspect of the afferent posterior gastric vein.  No gastric wall thickening or acute abnormality. High attenuation material layering within the stomach likely represents residual blood clot. Similar appearance of hepatic cirrhosis without significant interval change. No new varices identified. Unremarkable appearance of the bilateral kidneys. No focal solid lesion, hydronephrosis or nephrolithiasis. Unremarkable appearance of the spleen, pancreas and adrenal glands. No evidence of a bowel obstruction or focal bowel wall thickening. Mild mesenteric edema. Cholelithiasis again noted.  Bones/Soft Tissues: No acute fracture or aggressive appearing lytic or blastic osseous lesion. Body wall edema.  IMPRESSION: 1. Successful obliteration of gastric varices and gastrorenal shunt. A small amount of non occlusive sclerosant material is present with in the left renal vein. Concurrently, the left renal vein remains patent. The splenic and portal veins also remain patent. 2. Small bilateral layering effusions, body wall  edema, new abdominal ascites and mesenteric edema. Taken together, findings likely reflect third-spacing versus volume overload. 3. High attenuation material layering within the gastric fundus likely represents residual blood clot from prior hematemesis. 4. Additional ancillary findings as above without significant interval change compared recent prior imaging.  Signed,  Criselda Peaches, MD  [facility]  [facility]   Electronically Signed   By: Jacqulynn Cadet M.D.   On: 05/09/2013 12:04   Dg Chest Port 1 View  05/09/2013   CLINICAL DATA:  Extubated  EXAM: PORTABLE CHEST - 1 VIEW  COMPARISON:  Prior chest x-ray 05/08/2013  FINDINGS: Interval extubation. Left subclavian approach central venous catheter remains in unchanged  position with the tip in the upper SVC. Inspiratory volumes are lower resulting in increased vascular crowding and bibasilar atelectasis. There may be slightly increased pulmonary vascular congestion on today's exam. Stable cardiac and mediastinal contours. Metallic coils in radiopaque sclerosed sent material in the left upper quadrant consistent with recent BRTO procedure. No acute osseous abnormality.  IMPRESSION: 1. Lower inspiratory volumes following extubation results in vascular crowding and increased bibasilar atelectasis. 2. Perhaps slightly increased pulmonary vascular congestion. 3. Stable position of left subclavian central venous catheter.   Electronically Signed   By: Jacqulynn Cadet M.D.   On: 05/09/2013 08:18   Dg Chest Port 1 View  05/08/2013   CLINICAL DATA:  Intubation.  EXAM: PORTABLE CHEST - 1 VIEW  COMPARISON:  Chest x-ray 05/06/2013.  FINDINGS: Endotracheal tube left central line in stable position. Stable cardiomegaly and pulmonary venous congestion. Mild interstitial prominence and left-sided pleural effusion. Mild congestive heart failure cannot be excluded. Persistent mild bibasilar atelectasis. No pneumothorax.  IMPRESSION: 1. Line and tube positions stable. 2. Mild congestive heart failure with mild interstitial pulmonary edema and small left pleural effusion. 3. Persistent mild bibasilar atelectasis.   Electronically Signed   By: Marcello Moores  Register   On: 05/08/2013 07:38   ASSESSMENT / PLAN:  PULMONARY A: Acute respiratory failure in setting of acute encephalopathy, resolved P:   Goal SpO2>92 Oxygen PRN  CARDIOVASCULAR A:  Hemorrhagic shock, resolved Hypertensive P:  Goal MAP>65 Labetalol PRN  RENAL A:  Hyperchloremic metabolic acidosis P:   Trend BMP NS D5 1/2NS@100   GASTROINTESTINAL A:   Liver cirrhosis? Esophageal / gastric varices Upper GI hemorrhage S/p BRTO GI Px P:   Place feeding tube Start TF Protonix, change to daily  HEMATOLOGIC A:    Acute blood loss anemia Coagulopathy Thrombocytopenia VTE Px P:  Trend CBC / INR SCDs Monitor CBC intermittently  INFECTIOUS A:   At risk for SBP P:   Micro and abx above  ENDOCRINE A:  Normoglycemic P:   Monitor glu on chem panels Initiate SSI for glu > 180  NEUROLOGIC A:   Acute encephalopathy Alcohol abuse P:   D/c Fentanyl D/c Dilaudid D/c Versed Start Lactulose  Folate / Thiamin   I have personally obtained history, examined patient, evaluated and interpreted laboratory and imaging results, reviewed medical records, formulated assessment / plan and placed orders.  Doree Fudge, MD Pulmonary and [place] Pager: [phone]  05/09/2013, 6:24 PM

## 2013-05-09 NOTE — Progress Notes (Signed)
[facility] Progress Note Patient Name: Joseph Hawkins DOB: 13-Jan-1934 MRN: [id number]  Date of Service  05/09/2013   HPI/Events of Note   Dark red, old-appearing blood noted in NG after placement by RN. Remains hemodynamically stable. - Likely old.   eICU Interventions   Continue q6 CBCs. Observe for now.    Intervention Category Intermediate Interventions: Other:  Mariea Clonts 05/09/2013, 9:12 PM

## 2013-05-10 LAB — TYPE AND SCREEN
ABO/RH(D): B POS
ANTIBODY SCREEN: NEGATIVE
UNIT DIVISION: 0
UNIT DIVISION: 0
UNIT DIVISION: 0
Unit division: 0
Unit division: 0
Unit division: 0

## 2013-05-10 LAB — COMPREHENSIVE METABOLIC PANEL
ALT: 15 U/L (ref 0–53)
AST: 35 U/L (ref 0–37)
Albumin: 1.9 g/dL — ABNORMAL LOW (ref 3.5–5.2)
Alkaline Phosphatase: 161 U/L — ABNORMAL HIGH (ref 39–117)
BUN: 17 mg/dL (ref 6–23)
CALCIUM: 7.8 mg/dL — AB (ref 8.4–10.5)
CHLORIDE: 111 meq/L (ref 96–112)
CO2: 18 meq/L — AB (ref 19–32)
CREATININE: 0.95 mg/dL (ref 0.50–1.35)
GFR, EST NON AFRICAN AMERICAN: 78 mL/min — AB (ref >90)
GLUCOSE: 120 mg/dL — AB (ref 70–99)
Potassium: 3.4 mEq/L — ABNORMAL LOW (ref 3.7–5.3)
Sodium: 139 mEq/L (ref 137–147)
Total Bilirubin: 3.2 mg/dL — ABNORMAL HIGH (ref 0.3–1.2)
Total Protein: 5.6 g/dL — ABNORMAL LOW (ref 6.0–8.3)

## 2013-05-10 LAB — CBC
HCT: 27.7 % — ABNORMAL LOW (ref 39.0–52.0)
HEMATOCRIT: 28.1 % — AB (ref 39.0–52.0)
HEMOGLOBIN: 9.2 g/dL — AB (ref 13.0–17.0)
HEMOGLOBIN: 9.1 g/dL — AB (ref 13.0–17.0)
MCH: 29.3 pg (ref 26.0–34.0)
MCH: 29.4 pg (ref 26.0–34.0)
MCHC: 32.7 g/dL (ref 30.0–36.0)
MCHC: 32.9 g/dL (ref 30.0–36.0)
MCV: 89.5 fL (ref 78.0–100.0)
MCV: 89.6 fL (ref 78.0–100.0)
Platelets: 67 10*3/uL — ABNORMAL LOW (ref 150–400)
Platelets: 76 10*3/uL — ABNORMAL LOW (ref 150–400)
RBC: 3.14 MIL/uL — AB (ref 4.22–5.81)
RBC: 3.09 MIL/uL — AB (ref 4.22–5.81)
RDW: 19.9 % — ABNORMAL HIGH (ref 11.5–15.5)
RDW: 19.9 % — ABNORMAL HIGH (ref 11.5–15.5)
WBC: 10.1 10*3/uL (ref 4.0–10.5)
WBC: 9.2 10*3/uL (ref 4.0–10.5)

## 2013-05-10 LAB — GLUCOSE, CAPILLARY
GLUCOSE-CAPILLARY: 105 mg/dL — AB (ref 70–99)
GLUCOSE-CAPILLARY: 102 mg/dL — AB (ref 70–99)
GLUCOSE-CAPILLARY: 121 mg/dL — AB (ref 70–99)
Glucose-Capillary: 111 mg/dL — ABNORMAL HIGH (ref 70–99)
Glucose-Capillary: 122 mg/dL — ABNORMAL HIGH (ref 70–99)
Glucose-Capillary: 99 mg/dL (ref 70–99)

## 2013-05-10 LAB — PROTIME-INR
INR: 1.55 — AB (ref 0.00–1.49)
Prothrombin Time: 18.2 seconds — ABNORMAL HIGH (ref 11.6–15.2)

## 2013-05-10 MED ORDER — OXYCODONE HCL 5 MG PO TABS
5.0000 mg | ORAL_TABLET | ORAL | Status: DC | PRN
  Administered 2013-05-11 – 2013-05-15 (×4): 5 mg
  Filled 2013-05-10 (×4): qty 1

## 2013-05-10 MED ORDER — POTASSIUM CHLORIDE 20 MEQ/15ML (10%) PO LIQD
40.0000 meq | ORAL | Status: AC
  Administered 2013-05-10 (×2): 40 meq
  Filled 2013-05-10 (×2): qty 30

## 2013-05-10 MED ORDER — RIFAXIMIN 550 MG PO TABS
550.0000 mg | ORAL_TABLET | Freq: Two times a day (BID) | ORAL | Status: DC
  Administered 2013-05-10 – 2013-05-11 (×3): 550 mg via ORAL
  Filled 2013-05-10 (×4): qty 1

## 2013-05-10 MED ORDER — ALBUTEROL SULFATE (2.5 MG/3ML) 0.083% IN NEBU
2.5000 mg | INHALATION_SOLUTION | Freq: Four times a day (QID) | RESPIRATORY_TRACT | Status: DC | PRN
  Administered 2013-05-10 – 2013-05-15 (×2): 2.5 mg via RESPIRATORY_TRACT
  Filled 2013-05-10 (×3): qty 3

## 2013-05-10 MED ORDER — VITAL HIGH PROTEIN PO LIQD
1000.0000 mL | ORAL | Status: DC
  Administered 2013-05-10 – 2013-05-11 (×3): 1000 mL
  Filled 2013-05-10 (×4): qty 1000

## 2013-05-10 MED ORDER — FENTANYL CITRATE 0.05 MG/ML IJ SOLN
25.0000 ug | INTRAMUSCULAR | Status: DC | PRN
  Administered 2013-05-11 – 2013-05-15 (×7): 50 ug via INTRAVENOUS
  Filled 2013-05-10 (×7): qty 2

## 2013-05-10 NOTE — Progress Notes (Signed)
Brief Nutrition Note  Consult received for enteral/tube feeding initiation and management.  Adult Enteral Nutrition Protocol initiated. Full assessment to follow.  Vital HP @ 20 ml/hr.   Admitting Dx: GI BLEED esouphageal varisees  Body mass index is 26.39 kg/(m^2). Pt meets criteria for overweight based on current BMI.  Labs:   Recent Labs Lab 05/06/13 0117 05/06/13 0500  05/08/13 0410 05/09/13 0500 05/10/13 0315  NA 139 143  < > 141 141 139  K 5.7* 4.9  < > 4.3 4.3 3.4*  CL 108 111  < > 114* 114* 111  CO2 16* 19  < > 15* 17* 18*  BUN 49* 57*  < > 34* 23 17  CREATININE 1.45* 1.68*  < > 1.13 1.07 0.95  CALCIUM 8.1* 8.2*  < > 7.3* 7.7* 7.8*  MG 1.5 1.5  --   --   --   --   PHOS 3.8 4.2  --   --   --   --   GLUCOSE 124* 144*  < > 161* 122* 120*  < > = values in this interval not displayed.  Terrace Arabia RD, LDN

## 2013-05-10 NOTE — Progress Notes (Signed)
Subjective: Patient with alcoholic cirrhosis and history of esophageal varices s/p banding and now with re-bleed secondary to gastric varices s/p BRTO 05/07/13. Patient extubated 05/08/13. Patient still obtunded.   Objective: Physical Exam: BP 143/89  Pulse 52  Temp(Src) 99.2 F (37.3 C) (Oral)  Resp 22  Ht 6\' 1"  (1.854 m)  Wt 199 lb 15.3 oz (90.7 kg)  BMI 26.39 kg/m2  SpO2 100%  General: Obtunded Heart: RRR without M/G/R  Lungs: CTA bilaterally  Abd: Soft, ND, NT, (+) BS, no fluid wave  Ext: Right access site dressing C/D/I, no signs of bleeding or hematoma, DP intact bilaterally.  Labs: CBC  Recent Labs  05/10/13 0315 05/10/13 0830  WBC 10.1 9.2  HGB 9.2* 9.1*  HCT 28.1* 27.7*  PLT 67* 76*   BMET  Recent Labs  05/09/13 0500 05/10/13 0315  NA 141 139  K 4.3 3.4*  CL 114* 111  CO2 17* 18*  GLUCOSE 122* 120*  BUN 23 17  CREATININE 1.07 0.95  CALCIUM 7.7* 7.8*   LFT  Recent Labs  05/10/13 0315  PROT 5.6*  ALBUMIN 1.9*  AST 35  ALT 15  ALKPHOS 161*  BILITOT 3.2*   PT/INR  Recent Labs  05/09/13 0500 05/10/13 0315  LABPROT 16.9* 18.2*  INR 1.41 1.55*     Studies/Results: Ir Fluoro Rm 30-60 Min  05/08/2013   CLINICAL DATA:  Bleeding gastric varices 456.8 , post Balloon-occluded retrograde transvenous obliteration (BRTO) of gastric varices. Right common femoral vein 55F sheath and balloon occlusion catheter still in place  EXAM: IR FLOURO RM 0-60 MIN  MEDICATIONS: None  CONTRAST:  None  FLUOROSCOPY TIME:  48 sec  PROCEDURE: The previously placed right femoral sheath and catheter were prepped and draped in usual sterile fashion. A Bentson guidewire was advanced through the catheter and the catheter was gently removed. The long venous sheath was then also with drawn, with no change in morphology of the embolization material. The catheter was removed and hemostasis achieved at the site with manual compression. The patient tolerated the procedure well.   Complications:  No immediate complication  FINDINGS: Fluoroscopic inspection demonstrated stable appearance of the embolization material and coils in the left upper abdomen. The balloon of the occlusion balloon catheter was noted to no longer be inflated although the catheter remained in stable in position. A spot radiograph of the chest shows no evidence of migration of embolic material. After removal of the occlusion catheter, Embolization material and coils remain stable in position.  IMPRESSION: 1. Stable left upper abdominal coils and embolization material. The occlusion catheter and venous sheath were removed.   Electronically Signed   By: Arne Cleveland M.D.   On: 05/08/2013 13:52   Ct Abd Wo & W Cm  05/09/2013   CLINICAL DATA:  Post BRTO: Evaluate gastric varices and portal vein  EXAM: CT ABDOMEN WITHOUT AND WITH CONTRAST  TECHNIQUE: Multidetector CT imaging of the abdomen was performed following the standard protocol before and following the bolus administration of intravenous contrast.  CONTRAST:  154mL OMNIPAQUE IOHEXOL 300 MG/ML  SOLN  COMPARISON:  Prior CT abdomen/pelvis 05/06/2013  FINDINGS: Lower Chest: Small bilateral layering pleural effusions with associated bibasilar atelectasis. Stable mild cardiomegaly. Atherosclerotic calcifications noted in the coronary arteries. No pericardial effusion. Unremarkable visualized distal thoracic esophagus.  Abdomen: Interval development of small to moderate ascites. High attenuation sclerosing material noted throughout the gastric varices and gastro renal shunts. A small amount of contrast material extends from the gastro  renal shunt into the left renal vein. On the coronal reformatted images the sclerosing layers along the superior aspect of the renal vein. This is nonocclusive. The renal vein remains patent. No sclerosing identified within the splenic or portal vein. Sclerosant enters the proximal aspect of the afferent posterior gastric vein.  No gastric  wall thickening or acute abnormality. High attenuation material layering within the stomach likely represents residual blood clot. Similar appearance of hepatic cirrhosis without significant interval change. No new varices identified. Unremarkable appearance of the bilateral kidneys. No focal solid lesion, hydronephrosis or nephrolithiasis. Unremarkable appearance of the spleen, pancreas and adrenal glands. No evidence of a bowel obstruction or focal bowel wall thickening. Mild mesenteric edema. Cholelithiasis again noted.  Bones/Soft Tissues: No acute fracture or aggressive appearing lytic or blastic osseous lesion. Body wall edema.  IMPRESSION: 1. Successful obliteration of gastric varices and gastrorenal shunt. A small amount of non occlusive sclerosant material is present with in the left renal vein. Concurrently, the left renal vein remains patent. The splenic and portal veins also remain patent. 2. Small bilateral layering effusions, body wall edema, new abdominal ascites and mesenteric edema. Taken together, findings likely reflect third-spacing versus volume overload. 3. High attenuation material layering within the gastric fundus likely represents residual blood clot from prior hematemesis. 4. Additional ancillary findings as above without significant interval change compared recent prior imaging.  Signed,  Criselda Peaches, MD  [facility]  [facility]   Electronically Signed   By: Jacqulynn Cadet M.D.   On: 05/09/2013 12:04   Dg Chest Port 1 View  05/09/2013   CLINICAL DATA:  Extubated  EXAM: PORTABLE CHEST - 1 VIEW  COMPARISON:  Prior chest x-ray 05/08/2013  FINDINGS: Interval extubation. Left subclavian approach central venous catheter remains in unchanged position with the tip in the upper SVC. Inspiratory volumes are lower resulting in increased vascular crowding and bibasilar atelectasis. There may be slightly increased pulmonary vascular  congestion on today's exam. Stable cardiac and mediastinal contours. Metallic coils in radiopaque sclerosed sent material in the left upper quadrant consistent with recent BRTO procedure. No acute osseous abnormality.  IMPRESSION: 1. Lower inspiratory volumes following extubation results in vascular crowding and increased bibasilar atelectasis. 2. Perhaps slightly increased pulmonary vascular congestion. 3. Stable position of left subclavian central venous catheter.   Electronically Signed   By: Jacqulynn Cadet M.D.   On: 05/09/2013 08:18   Dg Abd Portable 1v  05/09/2013   CLINICAL DATA:  Enteric tube placement.  EXAM: PORTABLE ABDOMEN - 1 VIEW  COMPARISON:  CT ABD WO/W CM dated 05/09/2013  FINDINGS: Enteric tube is present with the tip in the gastric fundus. The enteric tube is in good position. Cluster of gallstones noted in the right abdomen. Calcification is present at the gastroesophageal junction with embolization coils.  IMPRESSION: Nasogastric tube in good position.   Electronically Signed   By: Dereck Ligas M.D.   On: 05/09/2013 23:51    Assessment/Plan: Alcoholic cirrhosis with esophageal varices s/p banding and gastric varices  Severe delirium  Acute respiratory failure s/p intubation and now extubation 05/08/13 on RA  Hematemesis s/p esophageal banding, presented with re-bleed secondary to gastric varices  S/p BRTO 05/07/13, total bilirubin elevated 3.2 (2.4), INR 1.5 (1.4), AST/ALT stable, follow, renal function stable, lactulose to be given today. Would consider ECHO, liver function may be declining secondary to passive venous congestion from heart dysfunction.   Repeat CT 05/09/13 revealed patent portal, splenic and  left renal veins.  IR will follow. I have discussed this with Dr. Laurence Ferrari today.     LOS: 4 days    Hedy Jacob PA-C 05/10/2013 10:35 AM

## 2013-05-10 NOTE — Progress Notes (Signed)
PULMONARY / CRITICAL CARE MEDICINE  Name: Joseph Hawkins MRN: [id number] DOB: [birth year]    ADMISSION DATE:  05/06/2013 CONSULTATION DATE:  05/06/2013   REFERRING MD :  Selena Lesser PRIMARY SERVICE: PCCM  CHIEF COMPLAINT:  UGI Bleed  BRIEF PATIENT DESCRIPTION: 79 yo with alcoholic cirrhosis and varices presented to [facility] with hematemesis due to variceal bleed. Underwent EGD and banding of esophageal varices but had further hematemesis presumed to be due to gastric varices. Transferred to [facility] in consideration of IR procedure.  SIGNIFICANT EVENTS / STUDIES:  5/6  CTA abd >>> Gastric varices involving the gastric fundus and cardia. These varices are draining into the left renal vein and consistent with a gastrorenal shunt. No significant esophageal varices. 5/7  BRTO performed by IR  LINES / TUBES: R F Cordis 5/06 >>> 5/8 ETT 5/6 >>> 5/8 L Dixon CVL 5/6 >>> L R AL 5/7 >>> 5/9 NGT 5/9 >>>  CULTURES:  ANTIBIOTICS: Ceftriaxone 5/6 >>>  SUBJECTIVE:  Unresponsive overnight. No BM.  VITAL SIGNS: Temp:  [98.1 F (36.7 C)-99.8 F (37.7 C)] 99.2 F (37.3 C) (05/10 0700) Pulse Rate:  [38-77] 52 (05/10 0900) Resp:  [18-25] 22 (05/10 1000) BP: (122-173)/(61-89) 143/89 mmHg (05/10 1000) SpO2:  [100 %] 100 % (05/10 0900) Arterial Line BP: (159-165)/(74-75) 165/75 mmHg (05/09 1400)  HEMODYNAMICS:   VENTILATOR SETTINGS:   INTAKE / OUTPUT: Intake/Output     05/09 0701 - 05/10 0700 05/10 0701 - 05/11 0700   I.V. (mL/kg) 2400 (26.5) 300 (3.3)   IV Piggyback 50 50   Total Intake(mL/kg) 2450 (27) 350 (3.9)   Urine (mL/kg/hr) 2025 (0.9) 150 (0.4)   Emesis/NG output 200 (0.1)    Total Output 2225 150   Net +225 +200          PHYSICAL EXAMINATION: General: Comfortable Neuro: Obtunded  HEENT: Moist membranes, NGT in place Cardiovascular: Regular, no murmurs Lungs: CTAB Abdomen: Soft, non tender Ext: warm, no edema  LABS: CBC  Recent Labs Lab 05/09/13 2115 05/10/13 0315 05/10/13 0830   WBC 10.7* 10.1 9.2  HGB 9.6* 9.2* 9.1*  HCT 29.0* 28.1* 27.7*  PLT 76* 67* 76*   Coag's  Recent Labs Lab 05/06/13 0117 05/07/13 0415 05/08/13 0410 05/09/13 0500 05/10/13 0315  APTT 32 34  --   --   --   INR 2.00* 1.58* 1.50* 1.41 1.55*   BMET  Recent Labs Lab 05/08/13 0410 05/09/13 0500 05/10/13 0315  NA 141 141 139  K 4.3 4.3 3.4*  CL 114* 114* 111  CO2 15* 17* 18*  BUN 34* 23 17  CREATININE 1.13 1.07 0.95  GLUCOSE 161* 122* 120*   Electrolytes  Recent Labs Lab 05/06/13 0117 05/06/13 0500  05/08/13 0410 05/09/13 0500 05/10/13 0315  CALCIUM 8.1* 8.2*  < > 7.3* 7.7* 7.8*  MG 1.5 1.5  --   --   --   --   PHOS 3.8 4.2  --   --   --   --   < > = values in this interval not displayed. Sepsis Markers No results found for this basename: LATICACIDVEN, PROCALCITON, O2SATVEN,  in the last 168 hours ABG  Recent Labs Lab 05/06/13 2104  PHART 7.349*  PCO2ART 31.8*  PO2ART 238.0*   Liver Enzymes  Recent Labs Lab 05/08/13 0410 05/09/13 0500 05/10/13 0315  AST 58* 56* 35  ALT 20 20 15   ALKPHOS 152* 173* 161*  BILITOT 1.7* 2.4* 3.2*  ALBUMIN 1.9* 2.0* 1.9*   Cardiac Enzymes  No results found for this basename: TROPONINI, PROBNP,  in the last 168 hours Glucose  Recent Labs Lab 05/09/13 1216 05/09/13 1518 05/09/13 1941 05/10/13 0032 05/10/13 0404 05/10/13 0713  GLUCAP 119* 125* 109* 111* 122* 105*   IMAGING: Ir Fluoro Rm 30-60 Min  05/08/2013   CLINICAL DATA:  Bleeding gastric varices 456.8 , post Balloon-occluded retrograde transvenous obliteration (BRTO) of gastric varices. Right common femoral vein 59F sheath and balloon occlusion catheter still in place  EXAM: IR FLOURO RM 0-60 MIN  MEDICATIONS: None  CONTRAST:  None  FLUOROSCOPY TIME:  48 sec  PROCEDURE: The previously placed right femoral sheath and catheter were prepped and draped in usual sterile fashion. A Bentson guidewire was advanced through the catheter and the catheter was gently  removed. The long venous sheath was then also with drawn, with no change in morphology of the embolization material. The catheter was removed and hemostasis achieved at the site with manual compression. The patient tolerated the procedure well.  Complications:  No immediate complication  FINDINGS: Fluoroscopic inspection demonstrated stable appearance of the embolization material and coils in the left upper abdomen. The balloon of the occlusion balloon catheter was noted to no longer be inflated although the catheter remained in stable in position. A spot radiograph of the chest shows no evidence of migration of embolic material. After removal of the occlusion catheter, Embolization material and coils remain stable in position.  IMPRESSION: 1. Stable left upper abdominal coils and embolization material. The occlusion catheter and venous sheath were removed.   Electronically Signed   By: Arne Cleveland M.D.   On: 05/08/2013 13:52   Ct Abd Wo & W Cm  05/09/2013   CLINICAL DATA:  Post BRTO: Evaluate gastric varices and portal vein  EXAM: CT ABDOMEN WITHOUT AND WITH CONTRAST  TECHNIQUE: Multidetector CT imaging of the abdomen was performed following the standard protocol before and following the bolus administration of intravenous contrast.  CONTRAST:  115mL OMNIPAQUE IOHEXOL 300 MG/ML  SOLN  COMPARISON:  Prior CT abdomen/pelvis 05/06/2013  FINDINGS: Lower Chest: Small bilateral layering pleural effusions with associated bibasilar atelectasis. Stable mild cardiomegaly. Atherosclerotic calcifications noted in the coronary arteries. No pericardial effusion. Unremarkable visualized distal thoracic esophagus.  Abdomen: Interval development of small to moderate ascites. High attenuation sclerosing material noted throughout the gastric varices and gastro renal shunts. A small amount of contrast material extends from the gastro renal shunt into the left renal vein. On the coronal reformatted images the sclerosing layers  along the superior aspect of the renal vein. This is nonocclusive. The renal vein remains patent. No sclerosing identified within the splenic or portal vein. Sclerosant enters the proximal aspect of the afferent posterior gastric vein.  No gastric wall thickening or acute abnormality. High attenuation material layering within the stomach likely represents residual blood clot. Similar appearance of hepatic cirrhosis without significant interval change. No new varices identified. Unremarkable appearance of the bilateral kidneys. No focal solid lesion, hydronephrosis or nephrolithiasis. Unremarkable appearance of the spleen, pancreas and adrenal glands. No evidence of a bowel obstruction or focal bowel wall thickening. Mild mesenteric edema. Cholelithiasis again noted.  Bones/Soft Tissues: No acute fracture or aggressive appearing lytic or blastic osseous lesion. Body wall edema.  IMPRESSION: 1. Successful obliteration of gastric varices and gastrorenal shunt. A small amount of non occlusive sclerosant material is present with in the left renal vein. Concurrently, the left renal vein remains patent. The splenic and portal veins also remain patent. 2. Small bilateral  layering effusions, body wall edema, new abdominal ascites and mesenteric edema. Taken together, findings likely reflect third-spacing versus volume overload. 3. High attenuation material layering within the gastric fundus likely represents residual blood clot from prior hematemesis. 4. Additional ancillary findings as above without significant interval change compared recent prior imaging.  Signed,  Criselda Peaches, MD  [facility]  [facility]   Electronically Signed   By: Jacqulynn Cadet M.D.   On: 05/09/2013 12:04   Dg Chest Port 1 View  05/09/2013   CLINICAL DATA:  Extubated  EXAM: PORTABLE CHEST - 1 VIEW  COMPARISON:  Prior chest x-ray 05/08/2013  FINDINGS: Interval extubation. Left subclavian  approach central venous catheter remains in unchanged position with the tip in the upper SVC. Inspiratory volumes are lower resulting in increased vascular crowding and bibasilar atelectasis. There may be slightly increased pulmonary vascular congestion on today's exam. Stable cardiac and mediastinal contours. Metallic coils in radiopaque sclerosed sent material in the left upper quadrant consistent with recent BRTO procedure. No acute osseous abnormality.  IMPRESSION: 1. Lower inspiratory volumes following extubation results in vascular crowding and increased bibasilar atelectasis. 2. Perhaps slightly increased pulmonary vascular congestion. 3. Stable position of left subclavian central venous catheter.   Electronically Signed   By: Jacqulynn Cadet M.D.   On: 05/09/2013 08:18   Dg Abd Portable 1v  05/09/2013   CLINICAL DATA:  Enteric tube placement.  EXAM: PORTABLE ABDOMEN - 1 VIEW  COMPARISON:  CT ABD WO/W CM dated 05/09/2013  FINDINGS: Enteric tube is present with the tip in the gastric fundus. The enteric tube is in good position. Cluster of gallstones noted in the right abdomen. Calcification is present at the gastroesophageal junction with embolization coils.  IMPRESSION: Nasogastric tube in good position.   Electronically Signed   By: Dereck Ligas M.D.   On: 05/09/2013 23:51   ASSESSMENT / PLAN:  PULMONARY A: Acute respiratory failure in setting of acute encephalopathy, resolved P:   Goal SpO2>92 Oxygen PRN  CARDIOVASCULAR A:  Hemorrhagic shock, resolved Hypertensive P:  Goal MAP>65 Labetalol PRN  RENAL A:  Hyperchloremic metabolic acidosis, improving Hypokalemia P:   Trend BMP  D5 1/2NS@100  K 40 x 2  GASTROINTESTINAL A:   Liver cirrhosis? Esophageal / gastric varices Upper GI hemorrhage S/p BRTO GI Px P:   TF Protonix  HEMATOLOGIC A:   Acute blood loss anemia, Hb stable Coagulopathy Thrombocytopenia VTE Px P:  Trend CBC / INR; d/c q6h  Hb SCDs  INFECTIOUS A:   At risk for SBP P:   Ceftriaxone for SBP px  ENDOCRINE A:  Normoglycemic P:   Monitor glu on chem panels Initiate SSI for glu > 180  NEUROLOGIC A:   Acute encephalopathy Alcohol abuse P:   Lactulose Add Rifaximin Folate / Thiamin   Transfer to SDU 5/10.  TRH to assume care 5/11.  I have personally obtained history, examined patient, evaluated and interpreted laboratory and imaging results, reviewed medical records, formulated assessment / plan and placed orders.  Doree Fudge, MD Pulmonary and [place] Pager: [phone]  05/10/2013, 11:12 AM

## 2013-05-10 NOTE — Progress Notes (Signed)
[place] Progress Note Patient Name: Niels Cranshaw DOB: February 08, 1934 MRN: [id number]  Date of Service  05/10/2013   HPI/Events of Note     eICU Interventions  PRN analgesics ordered   Intervention Category Intermediate Interventions: Pain - evaluation and management  Wilhelmina Mcardle 05/10/2013, 11:51 PM

## 2013-05-11 ENCOUNTER — Encounter (HOSPITAL_COMMUNITY): Payer: Self-pay | Admitting: *Deleted

## 2013-05-11 LAB — BASIC METABOLIC PANEL
BUN: 17 mg/dL (ref 6–23)
CHLORIDE: 114 meq/L — AB (ref 96–112)
CO2: 16 mEq/L — ABNORMAL LOW (ref 19–32)
CREATININE: 0.94 mg/dL (ref 0.50–1.35)
Calcium: 8.4 mg/dL (ref 8.4–10.5)
GFR calc Af Amer: >90 mL/min (ref >90)
GFR calc non Af Amer: 79 mL/min — ABNORMAL LOW (ref >90)
Glucose, Bld: 125 mg/dL — ABNORMAL HIGH (ref 70–99)
Potassium: 3.7 mEq/L (ref 3.7–5.3)
Sodium: 142 mEq/L (ref 137–147)

## 2013-05-11 LAB — GLUCOSE, CAPILLARY
GLUCOSE-CAPILLARY: 132 mg/dL — AB (ref 70–99)
Glucose-Capillary: 114 mg/dL — ABNORMAL HIGH (ref 70–99)
Glucose-Capillary: 120 mg/dL — ABNORMAL HIGH (ref 70–99)
Glucose-Capillary: 123 mg/dL — ABNORMAL HIGH (ref 70–99)
Glucose-Capillary: 93 mg/dL (ref 70–99)
Glucose-Capillary: 99 mg/dL (ref 70–99)

## 2013-05-11 LAB — CBC
HEMATOCRIT: 28.5 % — AB (ref 39.0–52.0)
Hemoglobin: 9.3 g/dL — ABNORMAL LOW (ref 13.0–17.0)
MCH: 29.4 pg (ref 26.0–34.0)
MCHC: 32.6 g/dL (ref 30.0–36.0)
MCV: 90.2 fL (ref 78.0–100.0)
PLATELETS: 102 10*3/uL — AB (ref 150–400)
RBC: 3.16 MIL/uL — AB (ref 4.22–5.81)
RDW: 19.9 % — ABNORMAL HIGH (ref 11.5–15.5)
WBC: 10.6 10*3/uL — AB (ref 4.0–10.5)

## 2013-05-11 MED ORDER — RIFAXIMIN 550 MG PO TABS
550.0000 mg | ORAL_TABLET | Freq: Two times a day (BID) | ORAL | Status: DC
  Administered 2013-05-11 – 2013-05-15 (×8): 550 mg
  Filled 2013-05-11 (×9): qty 1

## 2013-05-11 MED ORDER — VITAL AF 1.2 CAL PO LIQD
1000.0000 mL | ORAL | Status: DC
  Administered 2013-05-11 – 2013-05-13 (×3): 1000 mL
  Filled 2013-05-11 (×8): qty 1000

## 2013-05-11 MED ORDER — LACTULOSE 10 GM/15ML PO SOLN
30.0000 g | Freq: Three times a day (TID) | ORAL | Status: DC
  Administered 2013-05-11 – 2013-05-13 (×5): 30 g
  Filled 2013-05-11 (×7): qty 45

## 2013-05-11 MED ORDER — POTASSIUM CHLORIDE IN NACL 20-0.45 MEQ/L-% IV SOLN
INTRAVENOUS | Status: DC
  Filled 2013-05-11: qty 1000

## 2013-05-11 NOTE — Progress Notes (Signed)
Doing well post BRTO with no further gastric variceal bleeding.

## 2013-05-11 NOTE — Progress Notes (Signed)
4 Days Post-Op  Subjective: BRTO (balloon occluded retrograde transvenous obliteration) 5/7 in IR Has had no further hematemesis Transferred out of ICU today Not intubated   Objective: Vital signs in last 24 hours: Temp:  [98.1 F (36.7 C)-99.2 F (37.3 C)] 99 F (37.2 C) (05/11 0800) Pulse Rate:  [31-106] 71 (05/11 0900) Resp:  [14-27] 15 (05/11 1100) BP: (110-190)/(63-114) 129/76 mmHg (05/11 1100) SpO2:  [93 %-100 %] 100 % (05/11 0900) Weight:  [95.8 kg (211 lb 3.2 oz)-96.2 kg (212 lb 1.3 oz)] 95.8 kg (211 lb 3.2 oz) (05/11 0419) Last BM Date: 05/11/13  Intake/Output from previous day: 05/10 0701 - 05/11 0700 In: 3475.3 [I.V.:2400; NG/GT:965.3; IV Piggyback:50] Out: 550 [Urine:250; Stool:300] Intake/Output this shift: Total I/O In: 770 [I.V.:350; Other:30; NG/GT:340; IV Piggyback:50] Out: 0   PE; afeb; vss No response Not on vent- breathing on own H/h stable TB 3.2 today; 2.4; 1.7 plt 102 inr 1.55 LFTs wnl   Lab Results:   Recent Labs  05/10/13 0830 05/11/13 0330  WBC 9.2 10.6*  HGB 9.1* 9.3*  HCT 27.7* 28.5*  PLT 76* 102*   BMET  Recent Labs  05/10/13 0315 05/11/13 0330  NA 139 142  K 3.4* 3.7  CL 111 114*  CO2 18* 16*  GLUCOSE 120* 125*  BUN 17 17  CREATININE 0.95 0.94  CALCIUM 7.8* 8.4   PT/INR  Recent Labs  05/09/13 0500 05/10/13 0315  LABPROT 16.9* 18.2*  INR 1.41 1.55*   ABG No results found for this basename: PHART, PCO2, PO2, HCO3,  in the last 72 hours  Studies/Results: Dg Abd Portable 1v  05/09/2013   CLINICAL DATA:  Enteric tube placement.  EXAM: PORTABLE ABDOMEN - 1 VIEW  COMPARISON:  CT ABD WO/W CM dated 05/09/2013  FINDINGS: Enteric tube is present with the tip in the gastric fundus. The enteric tube is in good position. Cluster of gallstones noted in the right abdomen. Calcification is present at the gastroesophageal junction with embolization coils.  IMPRESSION: Nasogastric tube in good position.   Electronically Signed    By: Dereck Ligas M.D.   On: 05/09/2013 23:51    Anti-infectives: Anti-infectives   Start     Dose/Rate Route Frequency Ordered Stop   05/10/13 1230  rifaximin (XIFAXAN) tablet 550 mg     550 mg Oral 2 times daily 05/10/13 1130     05/06/13 0800  cefTRIAXone (ROCEPHIN) 1 g in dextrose 5 % 50 mL IVPB     1 g 100 mL/hr over 30 Minutes Intravenous Every 24 hours 05/06/13 0709        Assessment/Plan: s/p Procedure(s): RADIOLOGY WITH ANESTHESIA (N/A)   Gastric varices/ hematemesis BRTO 5/7 in IR No further bleeding reported Some better Off vent; transfer to step down   LOS: 5 days    Joseph Hawkins 05/11/2013

## 2013-05-11 NOTE — Progress Notes (Addendum)
INITIAL NUTRITION ASSESSMENT  DOCUMENTATION CODES Per approved criteria  -Not Applicable   INTERVENTION:  Initiate TF via NGT with Vital AF 1.2 at 40 ml/h, increase by 10 ml every 4 hours to goal rate of 65 ml/h to provide 1872 kcals, 117 gm protein, 1265 ml free water daily.  NUTRITION DIAGNOSIS: Inadequate oral intake related to inability to eat as evidenced by NPO status.   Goal: Intake to meet >90% of estimated nutrition needs.  Monitor:  TF tolerance/adequacy, weight trend, labs, vent status.  Reason for Assessment: MD Consult for TF initiation and management.  79 y.o. male  Admitting Dx: UGI Bleed  ASSESSMENT: 79 yo with alcoholic cirrhosis and varices presented to [facility] with hematemesis due to variceal bleed. Underwent EGD and banding of esophageal varices but had further hematemesis presumed to be due to gastric varices. Transferred to [facility] in consideration of IR procedure. CT abd on 5/6 showed no significant esophageal varices. S/P BRTO on 5/7.  Patient required intubation on 5/6. Extubated on 5/8. Patient is obtunded with acute encephalopathy; not alert enough to take PO's. Received MD Consult for TF initiation and management. NGT is in place. TF Protocol was initiated yesterday. Currently receiving Vital High Protein at 40 ml/h with Prostat 30 ml BID to provide 1160 kcals, 114 gm protein, 803 ml free water daily.  Wife reports that patient was eating well and had a good appetite PTA. She does not think that he had lost weight PTA. Given history of alcohol abuse, suspect patient's diet quality is poor. Suspect low protein intake.  Nutrition Focused Physical Exam:  Subcutaneous Fat:  Orbital Region: mild depletion Upper Arm Region: WNL Thoracic and Lumbar Region: NA  Muscle:  Temple Region: moderate depletion Clavicle Bone Region: moderate-severe depletion Clavicle and Acromion Bone Region: mild depletion Scapular Bone Region: NA Dorsal Hand: WNL Patellar Region:  WNL Anterior Thigh Region: WNL Posterior Calf Region: WNL  Edema: none   Height: Ht Readings from Last 1 Encounters:  05/06/13 6\' 1"  (1.854 m)    Weight: Wt Readings from Last 1 Encounters:  05/11/13 211 lb 3.2 oz (95.8 kg)  05/06/13 190 lb (ADMISSION WEIGHT)  Ideal Body Weight: 83.6 kg  % Ideal Body Weight: 103% (using admission weight)  Wt Readings from Last 10 Encounters:  05/11/13 211 lb 3.2 oz (95.8 kg)  05/11/13 211 lb 3.2 oz (95.8 kg)    Usual Body Weight: unknown  % Usual Body Weight: N/A  BMI:  Body mass index is 27.87 kg/(m^2).  Estimated Nutritional Needs: Kcal: 1900-2100 Protein: 100-115 gm Fluid: 1.9-2.1 L  Skin: no wounds  Diet Order: NPO  EDUCATION NEEDS: -Education not appropriate at this time   Intake/Output Summary (Last 24 hours) at 05/11/13 0843 Last data filed at 05/11/13 0809  Gross per 24 hour  Intake 3495.33 ml  Output    550 ml  Net 2945.33 ml    Last BM: 5/11   Labs:   Recent Labs Lab 05/06/13 0117 05/06/13 0500  05/09/13 0500 05/10/13 0315 05/11/13 0330  NA 139 143  < > 141 139 142  K 5.7* 4.9  < > 4.3 3.4* 3.7  CL 108 111  < > 114* 111 114*  CO2 16* 19  < > 17* 18* 16*  BUN 49* 57*  < > 23 17 17   CREATININE 1.45* 1.68*  < > 1.07 0.95 0.94  CALCIUM 8.1* 8.2*  < > 7.7* 7.8* 8.4  MG 1.5 1.5  --   --   --   --  PHOS 3.8 4.2  --   --   --   --   GLUCOSE 124* 144*  < > 122* 120* 125*  < > = values in this interval not displayed.  CBG (last 3)   Recent Labs  05/11/13 0014 05/11/13 0357 05/11/13 0805  GLUCAP 93 123* 114*    Scheduled Meds: . antiseptic oral rinse  15 mL Mouth Rinse QID  . cefTRIAXone (ROCEPHIN)  IV  1 g Intravenous Q24H  . feeding supplement (VITAL HIGH PROTEIN)  1,000 mL Per Tube Q24H  . folic acid  1 mg Intravenous Daily  . lactulose  30 g Oral TID  . pantoprazole (PROTONIX) IV  40 mg Intravenous Q24H  . rifaximin  550 mg Oral BID  . thiamine  100 mg Intravenous Daily     Continuous Infusions: . dextrose 5 % and 0.45% NaCl 100 mL/hr at 05/09/13 1012    Past Medical History includes alcoholic cirrhosis and varices.  Past Surgical History  Procedure Laterality Date  . Radiology with anesthesia N/A 05/07/2013    Procedure: RADIOLOGY WITH ANESTHESIA;  Surgeon: Jacqulynn Cadet, MD;  Location: [place];  Service: Radiology;  Laterality: N/A;    Molli Barrows, RD, LDN, [place] Pager [phone] After Hours Pager [phone]

## 2013-05-11 NOTE — Progress Notes (Signed)
Report called to receiving nurse (2C14). Past medical history and present hospitalization reported to nurse. All questions answered. All belongings transported with patient via bed. Wife at bedside and aware.

## 2013-05-11 NOTE — Progress Notes (Signed)
[place] TEAM 1 - Stepdown/ICU TEAM Progress Note  Joseph Hawkins UYQ:[id number] DOB: 01/19/1934 DOA: 05/06/2013 PCP: Lottie Mussel, MD  Admit HPI / Brief Narrative: 79 yo with alcoholic cirrhosis and varices who presented to [facility] with hematemesis due to variceal bleed. Underwent EGD and banding of esophageal varices but had further hematemesis presumed to be due to gastric varices. Transferred to [facility] in consideration of IR procedure.   SIGNIFICANT EVENTS / STUDIES:  5/5 - presented to [facility] with UGIB. Bands esoph x 4 5/06 increased agitation/EtOH withdrawal syndrome. Intubated to permit diagnostic and therapeutic procedures 5/6 CTA abd > Gastric varices involving the gastric fundus and cardia. These varices are draining into the left renal vein and consistent with a gastrorenal shunt. No significant esophageal varices.  ETT 5/6 >>> 5/8 5/7 Balloon-occluded retrograde transvenous obliteration of gastric varices performed by IR  HPI/Subjective: The patient is lethargic at the time of my exam.  His wife is at the bedside and states that he has been more alert today that overall she feels that he is "much better now."  Assessment/Plan:  Esophageal / gastric varices  Patient appears to be stabilizing post hemostatic procedures  Upper GI hemorrhage Patient appears to be stabilizing post hemostatic procedures  S/p BRTO Ongoing care as per Interventional Radiology  Acute blood loss anemia Hb stable - follow trend  Coagulopathy  Recheck coags in a.m.  Thrombocytopenia Due to alcohol/liver disease - platelet count climbing  Acute encephalopathy  The patient is quite obtunded at the time of my exam but the patient's wife states that he has been more alert today - check ammonia level in a.m. - monitor in step down unit  Alcohol abuse  Liver cirrhosis  Acute respiratory failure in setting of acute encephalopathy resolved  Hemorrhagic shock resolved  Hyperchloremic metabolic  acidosis Adjust IV fluid and follow  Hypokalemia Improving with replacement  Code Status: FULL Family Communication: Spoke with wife at bedside Disposition Plan: SDU  Consultants: PCCM > TRH IR  Antibiotics: Ceftriaxone 5/6 >  DVT prophylaxis: SCDs  Objective: Blood pressure 116/63, pulse 76, temperature 99.7 F (37.6 C), temperature source Axillary, resp. rate 15, height 6\' 1"  (1.854 m), weight 95 kg (209 lb 7 oz), SpO2 96.00%.  Intake/Output Summary (Last 24 hours) at 05/11/13 1517 Last data filed at 05/11/13 1100  Gross per 24 hour  Intake   3220 ml  Output    300 ml  Net   2920 ml   Exam: General: No acute respiratory distress evident - patient lethargic Lungs: Clear to auscultation bilaterally without wheezes or crackles Cardiovascular: Regular rate and rhythm without murmur gallop or rub normal S1 and S2 Abdomen: Nontender, nondistended, soft, bowel sounds positive, no rebound, no ascites, no appreciable mass Extremities: No significant cyanosis, clubbing, or edema bilateral lower extremities  Data Reviewed: Basic Metabolic Panel:  Recent Labs Lab 05/06/13 0117 05/06/13 0500 05/07/13 0415 05/07/13 1627 05/08/13 0410 05/09/13 0500 05/10/13 0315 05/11/13 0330  NA 139 143 142 144 141 141 139 142  K 5.7* 4.9 4.4 4.3 4.3 4.3 3.4* 3.7  CL 108 111 114*  --  114* 114* 111 114*  CO2 16* 19 18*  --  15* 17* 18* 16*  GLUCOSE 124* 144* 163* 111* 161* 122* 120* 125*  BUN 49* 57* 53*  --  34* 23 17 17   CREATININE 1.45* 1.68* 1.63*  --  1.13 1.07 0.95 0.94  CALCIUM 8.1* 8.2* 7.5*  --  7.3* 7.7* 7.8* 8.4  MG 1.5 1.5  --   --   --   --   --   --   PHOS 3.8 4.2  --   --   --   --   --   --    Liver Function Tests:  Recent Labs Lab 05/06/13 0117 05/07/13 0415 05/08/13 0410 05/09/13 0500 05/10/13 0315  AST 52* 39* 58* 56* 35  ALT 17 17 20 20 15   ALKPHOS 214* 171* 152* 173* 161*  BILITOT 3.5* 1.5* 1.7* 2.4* 3.2*  PROT 5.6* 5.3* 5.0* 5.6* 5.6*  ALBUMIN 2.1*  2.0* 1.9* 2.0* 1.9*   No results found for this basename: AMMONIA,  in the last 168 hours  CBC:  Recent Labs Lab 05/09/13 0500 05/09/13 2115 05/10/13 0315 05/10/13 0830 05/11/13 0330  WBC 10.4 10.7* 10.1 9.2 10.6*  HGB 9.3* 9.6* 9.2* 9.1* 9.3*  HCT 28.4* 29.0* 28.1* 27.7* 28.5*  MCV 89.0 89.5 89.5 89.6 90.2  PLT 86* 76* 67* 76* 102*   CBG:  Recent Labs Lab 05/10/13 1946 05/11/13 0014 05/11/13 0357 05/11/13 0805 05/11/13 1208  GLUCAP 121* 93 123* 114* 99    Recent Results (from the past 240 hour(s))  MRSA PCR SCREENING     Status: None   Collection Time    05/06/13  1:04 AM      Result Value Ref Range Status   MRSA by PCR NEGATIVE  NEGATIVE Final   Comment:            The GeneXpert MRSA Assay (FDA     approved for NASAL specimens     only), is one component of a     comprehensive MRSA colonization     surveillance program. It is not     intended to diagnose MRSA     infection nor to guide or     monitor treatment for     MRSA infections.     Studies:  Recent x-ray studies have been reviewed in detail by the Attending Physician  Scheduled Meds:  Scheduled Meds: . antiseptic oral rinse  15 mL Mouth Rinse QID  . cefTRIAXone (ROCEPHIN)  IV  1 g Intravenous Q24H  . folic acid  1 mg Intravenous Daily  . lactulose  30 g Oral TID  . pantoprazole (PROTONIX) IV  40 mg Intravenous Q24H  . rifaximin  550 mg Oral BID  . thiamine  100 mg Intravenous Daily    Time spent on care of this patient: 35 mins   Cherene Altes , MD   [facility] Office  [phone] Pager - Text Page per Amion as per below:  On-Call/Text Page:      [email or web address]      password [email or web address]  If 7PM-7AM, please contact night-coverage [email or web address] Password [email or web address] 05/11/2013, 3:17 PM   LOS: 5 days

## 2013-05-12 DIAGNOSIS — I868 Varicose veins of other specified sites: Secondary | ICD-10-CM

## 2013-05-12 DIAGNOSIS — I8511 Secondary esophageal varices with bleeding: Secondary | ICD-10-CM

## 2013-05-12 LAB — COMPREHENSIVE METABOLIC PANEL WITH GFR
ALT: 12 U/L (ref 0–53)
AST: 33 U/L (ref 0–37)
Albumin: 1.8 g/dL — ABNORMAL LOW (ref 3.5–5.2)
Alkaline Phosphatase: 199 U/L — ABNORMAL HIGH (ref 39–117)
BUN: 19 mg/dL (ref 6–23)
CO2: 17 meq/L — ABNORMAL LOW (ref 19–32)
Calcium: 8.2 mg/dL — ABNORMAL LOW (ref 8.4–10.5)
Chloride: 117 meq/L — ABNORMAL HIGH (ref 96–112)
Creatinine, Ser: 1.03 mg/dL (ref 0.50–1.35)
GFR calc Af Amer: 79 mL/min — ABNORMAL LOW (ref >90)
GFR calc non Af Amer: 68 mL/min — ABNORMAL LOW (ref >90)
Glucose, Bld: 127 mg/dL — ABNORMAL HIGH (ref 70–99)
Potassium: 3.5 meq/L — ABNORMAL LOW (ref 3.7–5.3)
Sodium: 144 meq/L (ref 137–147)
Total Bilirubin: 2.7 mg/dL — ABNORMAL HIGH (ref 0.3–1.2)
Total Protein: 5.7 g/dL — ABNORMAL LOW (ref 6.0–8.3)

## 2013-05-12 LAB — CBC
HCT: 28.1 % — ABNORMAL LOW (ref 39.0–52.0)
Hemoglobin: 9 g/dL — ABNORMAL LOW (ref 13.0–17.0)
MCH: 28.6 pg (ref 26.0–34.0)
MCHC: 32 g/dL (ref 30.0–36.0)
MCV: 89.2 fL (ref 78.0–100.0)
Platelets: 128 K/uL — ABNORMAL LOW (ref 150–400)
RBC: 3.15 MIL/uL — ABNORMAL LOW (ref 4.22–5.81)
RDW: 20 % — ABNORMAL HIGH (ref 11.5–15.5)
WBC: 11.4 K/uL — ABNORMAL HIGH (ref 4.0–10.5)

## 2013-05-12 LAB — GLUCOSE, CAPILLARY
GLUCOSE-CAPILLARY: 125 mg/dL — AB (ref 70–99)
GLUCOSE-CAPILLARY: 128 mg/dL — AB (ref 70–99)
GLUCOSE-CAPILLARY: 139 mg/dL — AB (ref 70–99)
Glucose-Capillary: 116 mg/dL — ABNORMAL HIGH (ref 70–99)
Glucose-Capillary: 127 mg/dL — ABNORMAL HIGH (ref 70–99)
Glucose-Capillary: 114 mg/dL — ABNORMAL HIGH (ref 70–99)

## 2013-05-12 LAB — APTT: aPTT: 36 seconds (ref 24–37)

## 2013-05-12 LAB — PROTIME-INR
INR: 1.58 — ABNORMAL HIGH (ref 0.00–1.49)
Prothrombin Time: 18.4 s — ABNORMAL HIGH (ref 11.6–15.2)

## 2013-05-12 LAB — AMMONIA: Ammonia: 33 umol/L (ref 11–60)

## 2013-05-12 MED ORDER — SODIUM CHLORIDE 0.9 % IV SOLN
INTRAVENOUS | Status: DC
  Administered 2013-05-12: 250 mL via INTRAVENOUS
  Administered 2013-05-13: 1000 mL via INTRAVENOUS

## 2013-05-12 MED ORDER — POTASSIUM CHLORIDE 20 MEQ/15ML (10%) PO LIQD
40.0000 meq | Freq: Once | ORAL | Status: AC
  Administered 2013-05-12: 40 meq

## 2013-05-12 MED ORDER — POTASSIUM CHLORIDE 20 MEQ/15ML (10%) PO LIQD
ORAL | Status: AC
  Filled 2013-05-12: qty 30

## 2013-05-12 MED ORDER — FREE WATER
100.0000 mL | Freq: Three times a day (TID) | Status: DC
  Administered 2013-05-12 – 2013-05-13 (×3): 100 mL

## 2013-05-12 NOTE — Progress Notes (Signed)
Paged Ms. Erin Hearing regarding patient's complaint of sore throat and informed her of the rhonchus sounds to bilateral lungs.  She stated that she will put an order for a flutter valve and try getting patient out of bed.   Tried to get patient out of bed but he is grunting.  He tolerated well sitting at the side of the bed with 1 assist for a good 5 minutes with wife at the bedside.  He denies of pain.  His O2 sats remains in the  High 90's.  He is more alert and responsive.

## 2013-05-12 NOTE — Progress Notes (Signed)
[place] TEAM 1 - Stepdown/ICU TEAM Progress Note  Clemon Devaul LOV:[id number] DOB: January 21, 1934 DOA: 05/06/2013 PCP: Lottie Mussel, MD  Admit HPI / Brief Narrative: 79 yo with alcoholic cirrhosis and varices who presented to [facility] with hematemesis due to variceal bleed. Underwent EGD and banding of esophageal varices but had further hematemesis presumed to be due to gastric varices. Transferred to [facility] in consideration of IR procedure.   SIGNIFICANT EVENTS / STUDIES:  5/5 - presented to [facility] with UGIB. Bands esoph x 4 5/06 increased agitation/EtOH withdrawal syndrome. Intubated to permit diagnostic and therapeutic procedures 5/6 CTA abd > Gastric varices involving the gastric fundus and cardia. These varices are draining into the left renal vein and consistent with a gastrorenal shunt. No significant esophageal varices.  ETT 5/6 >>> 5/8 5/7 Balloon-occluded retrograde transvenous obliteration of gastric varices performed by IR  HPI/Subjective: Awake and asking for paper towel so can spit out oral debris. Denies pain.  Assessment/Plan:  Esophageal / gastric varices /S/p BRTO Patient appears to be stabilizing post hemostatic procedures (coil embolization proximal gastro-renal shunt 5/7) per IR  Upper GI hemorrhage Patient appears to be stabilizing post hemostatic procedures-hgb stable  Acute blood loss anemia Hb stable - follow trend  Cirrhosis/Coagulopathy/ Thrombocytopenia PT/INR mildly elevated Due to alcohol/liver disease - platelet count climbing Cirrhotic changes on CT but no splenomegaly Child Pugh score 10 c/w Class C  Acute encephalopathy  The patient is quite obtunded at the time of my exam but the patient's wife states that he has been more alert today - check ammonia level in a.m. - monitor in step down unit  Dysphagia/protein calorie malnutrition Continue TF per NG Consider SLP eval in 24-48 hrs  Alcohol abuse  Acute respiratory failure in setting of acute  encephalopathy resolved  Hemorrhagic shock resolved  Hyperchloremic metabolic acidosis Change IVF to NS at [facility] since tolerating TF Begin free water PT Follow lytes   Hypokalemia Improving with replacement per tube  Code Status: FULL Family Communication: No family at bedside Disposition Plan: SDU  Consultants: PCCM > TRH IR  Antibiotics: Ceftriaxone 5/6 >  DVT prophylaxis: SCDs  Objective: Blood pressure 140/78, pulse 89, temperature 100.2 F (37.9 C), temperature source Axillary, resp. rate 20, height 6\' 1"  (1.854 m), weight 209 lb 7 oz (95 kg), SpO2 100.00%.  Intake/Output Summary (Last 24 hours) at 05/12/13 1008 Last data filed at 05/12/13 0442  Gross per 24 hour  Intake   1330 ml  Output    425 ml  Net    905 ml   Exam: General: No acute respiratory distress evident - patient lethargic Lungs: Clear to auscultation bilaterally without wheezes or crackles, RA Cardiovascular: Regular rate and rhythm without murmur gallop or rub normal S1 and S2 Abdomen: Nontender, nondistended, soft, bowel sounds positive, no rebound, no ascites, no appreciable mass-NGT with TF infusing Extremities: No significant cyanosis, clubbing, or edema bilateral lower extremities  Data Reviewed: Basic Metabolic Panel:  Recent Labs Lab 05/06/13 0117 05/06/13 0500  05/08/13 0410 05/09/13 0500 05/10/13 0315 05/11/13 0330 05/12/13 0515  NA 139 143  < > 141 141 139 142 144  K 5.7* 4.9  < > 4.3 4.3 3.4* 3.7 3.5*  CL 108 111  < > 114* 114* 111 114* 117*  CO2 16* 19  < > 15* 17* 18* 16* 17*  GLUCOSE 124* 144*  < > 161* 122* 120* 125* 127*  BUN 49* 57*  < > 34* 23 17 17 19   CREATININE  1.45* 1.68*  < > 1.13 1.07 0.95 0.94 1.03  CALCIUM 8.1* 8.2*  < > 7.3* 7.7* 7.8* 8.4 8.2*  MG 1.5 1.5  --   --   --   --   --   --   PHOS 3.8 4.2  --   --   --   --   --   --   < > = values in this interval not displayed. Liver Function Tests:  Recent Labs Lab 05/07/13 0415 05/08/13 0410  05/09/13 0500 05/10/13 0315 05/12/13 0515  AST 39* 58* 56* 35 33  ALT 17 20 20 15 12   ALKPHOS 171* 152* 173* 161* 199*  BILITOT 1.5* 1.7* 2.4* 3.2* 2.7*  PROT 5.3* 5.0* 5.6* 5.6* 5.7*  ALBUMIN 2.0* 1.9* 2.0* 1.9* 1.8*    Recent Labs Lab 05/12/13 0500  AMMONIA 33    CBC:  Recent Labs Lab 05/09/13 2115 05/10/13 0315 05/10/13 0830 05/11/13 0330 05/12/13 0515  WBC 10.7* 10.1 9.2 10.6* 11.4*  HGB 9.6* 9.2* 9.1* 9.3* 9.0*  HCT 29.0* 28.1* 27.7* 28.5* 28.1*  MCV 89.5 89.5 89.6 90.2 89.2  PLT 76* 67* 76* 102* 128*   CBG:  Recent Labs Lab 05/11/13 1742 05/11/13 2010 05/12/13 0004 05/12/13 0422 05/12/13 0747  GLUCAP 120* 132* 128* 139* 125*    Recent Results (from the past 240 hour(s))  MRSA PCR SCREENING     Status: None   Collection Time    05/06/13  1:04 AM      Result Value Ref Range Status   MRSA by PCR NEGATIVE  NEGATIVE Final   Comment:            The GeneXpert MRSA Assay (FDA     approved for NASAL specimens     only), is one component of a     comprehensive MRSA colonization     surveillance program. It is not     intended to diagnose MRSA     infection nor to guide or     monitor treatment for     MRSA infections.     Studies:  Recent x-ray studies have been reviewed in detail by the Attending Physician  Scheduled Meds:  Scheduled Meds: . antiseptic oral rinse  15 mL Mouth Rinse QID  . cefTRIAXone (ROCEPHIN)  IV  1 g Intravenous Q24H  . folic acid  1 mg Intravenous Daily  . free water  100 mL Per Tube 3 times per day  . lactulose  30 g Per Tube TID  . pantoprazole (PROTONIX) IV  40 mg Intravenous Q24H  . potassium chloride      . rifaximin  550 mg Per Tube BID  . thiamine  100 mg Intravenous Daily    Time spent on care of this patient: 35 mins   Samella Parr , ANP   [facility] Office  [phone] Pager - Text Page per Shea Evans as per below:  On-Call/Text Page:      [email or web address]      password [email or web address]  If 7PM-7AM, please  contact night-coverage [email or web address] Password [email or web address] 05/12/2013, 10:08 AM   LOS: 6 days   Have examined Pt w/ ANP Ebony Hail discussed A/P and agree with plan. Discussed plan w/ Pt and answered all questions.  Greater then 40 min were spent in direct care of this Pt with MMP.

## 2013-05-13 DIAGNOSIS — I4891 Unspecified atrial fibrillation: Secondary | ICD-10-CM

## 2013-05-13 DIAGNOSIS — E872 Acidosis, unspecified: Secondary | ICD-10-CM

## 2013-05-13 DIAGNOSIS — R197 Diarrhea, unspecified: Secondary | ICD-10-CM | POA: Diagnosis present

## 2013-05-13 LAB — CLOSTRIDIUM DIFFICILE BY PCR: Toxigenic C. Difficile by PCR: NEGATIVE

## 2013-05-13 LAB — BASIC METABOLIC PANEL
BUN: 22 mg/dL (ref 6–23)
CALCIUM: 8.9 mg/dL (ref 8.4–10.5)
CO2: 18 mEq/L — ABNORMAL LOW (ref 19–32)
CREATININE: 1.04 mg/dL (ref 0.50–1.35)
Chloride: 119 mEq/L — ABNORMAL HIGH (ref 96–112)
GFR calc Af Amer: 78 mL/min — ABNORMAL LOW (ref >90)
GFR calc non Af Amer: 67 mL/min — ABNORMAL LOW (ref >90)
Glucose, Bld: 156 mg/dL — ABNORMAL HIGH (ref 70–99)
Potassium: 3.7 mEq/L (ref 3.7–5.3)
Sodium: 148 mEq/L — ABNORMAL HIGH (ref 137–147)

## 2013-05-13 LAB — GLUCOSE, CAPILLARY
GLUCOSE-CAPILLARY: 104 mg/dL — AB (ref 70–99)
GLUCOSE-CAPILLARY: 133 mg/dL — AB (ref 70–99)
GLUCOSE-CAPILLARY: 119 mg/dL — AB (ref 70–99)
Glucose-Capillary: 105 mg/dL — ABNORMAL HIGH (ref 70–99)
Glucose-Capillary: 113 mg/dL — ABNORMAL HIGH (ref 70–99)
Glucose-Capillary: 116 mg/dL — ABNORMAL HIGH (ref 70–99)
Glucose-Capillary: 125 mg/dL — ABNORMAL HIGH (ref 70–99)

## 2013-05-13 LAB — CBC
HCT: 28.6 % — ABNORMAL LOW (ref 39.0–52.0)
Hemoglobin: 9.2 g/dL — ABNORMAL LOW (ref 13.0–17.0)
MCH: 29.1 pg (ref 26.0–34.0)
MCHC: 32.2 g/dL (ref 30.0–36.0)
MCV: 90.5 fL (ref 78.0–100.0)
PLATELETS: 133 10*3/uL — AB (ref 150–400)
RBC: 3.16 MIL/uL — ABNORMAL LOW (ref 4.22–5.81)
RDW: 20.5 % — AB (ref 11.5–15.5)
WBC: 12 10*3/uL — ABNORMAL HIGH (ref 4.0–10.5)

## 2013-05-13 MED ORDER — METOPROLOL TARTRATE 25 MG/10 ML ORAL SUSPENSION
25.0000 mg | Freq: Two times a day (BID) | ORAL | Status: DC
  Administered 2013-05-13 – 2013-05-15 (×5): 25 mg
  Filled 2013-05-13 (×7): qty 10

## 2013-05-13 MED ORDER — FREE WATER
200.0000 mL | Freq: Four times a day (QID) | Status: DC
  Administered 2013-05-13 – 2013-05-14 (×4): 200 mL

## 2013-05-13 MED ORDER — ZOLPIDEM TARTRATE 5 MG PO TABS: 5.0000 mg | ORAL_TABLET | Freq: Every evening | ORAL | Status: DC | PRN

## 2013-05-13 NOTE — Evaluation (Signed)
Physical Therapy Evaluation Patient Details Name: Joseph Hawkins MRN: [id number] DOB: 04-28-1934 Today's Date: 05/13/2013   History of Present Illness  Patient is a 79 y/o male with h/o ETOH cirrhosis and esophogeal varices s/p banding, admitted with hematemesis with esophogeal bleeding,  underwent BRTO procedure 05/07/13. intubated 5/6-5/8.  Clinical Impression  Patient presents with decreased independence with mobility due to deficits listed below in PT problem list.  He will benefit from skilled PT in the acute setting to allow return home with wife assist after CIR level rehab stay.    Follow Up Recommendations CIR    Equipment Recommendations  Rolling walker with 5" wheels    Recommendations for Other Services Rehab consult     Precautions / Restrictions Precautions Precautions: Fall Precaution Comments: NG tube, rectal tube      Mobility  Bed Mobility               General bed mobility comments: pt in chair  Transfers Overall transfer level: Needs assistance Equipment used:  (pulled up on back of chair) Transfers: Sit to/from Stand Sit to Stand: Mod assist         General transfer comment: cued for hand placement, assist to lift into standing  Ambulation/Gait Ambulation/Gait assistance:  (NT due to multiple lines)              Stairs            Wheelchair Mobility    Modified Rankin (Stroke Patients Only)       Balance Overall balance assessment: Needs assistance         Standing balance support: Bilateral upper extremity supported Standing balance-Leahy Scale: Poor Standing balance comment: UE assist on back of chair; stood to march in place total of about 45 seconds                             Pertinent Vitals/Pain No pain complaints; HR up to 145 a-fib, SpO2 88 on RA and RR up to 38 with standing activity.    Home Living Family/patient expects to be discharged to:: Private residence Living Arrangements:  Spouse/significant other Available Help at Discharge: Family Type of Home: Mobile home Home Access: Stairs to enter Entrance Stairs-Rails: Right Entrance Stairs-Number of Steps: 3-4 Home Layout: One level Home Equipment: None      Prior Function Level of Independence: Independent               Hand Dominance        Extremity/Trunk Assessment   Upper Extremity Assessment: Overall WFL for tasks assessed           Lower Extremity Assessment: Generalized weakness         Communication   Communication: No difficulties  Cognition Arousal/Alertness: Awake/alert Behavior During Therapy: Flat affect Overall Cognitive Status: Impaired/Different from baseline Area of Impairment: Following commands       Following Commands: Follows one step commands with increased time            General Comments      Exercises        Assessment/Plan    PT Assessment Patient needs continued PT services  PT Diagnosis Generalized weakness;Abnormality of gait   PT Problem List Decreased strength;Decreased activity tolerance;Decreased balance;Decreased knowledge of use of DME;Decreased cognition;Decreased mobility;Decreased safety awareness;Cardiopulmonary status limiting activity  PT Treatment Interventions DME instruction;Gait training;Stair training;Functional mobility training;Therapeutic activities;Patient/family education;Cognitive remediation;Balance training;Therapeutic exercise   PT Goals (Current  goals can be found in the Care Plan section) Acute Rehab PT Goals Patient Stated Goal: To return to independent PT Goal Formulation: With patient/family Time For Goal Achievement: 05/27/13 Potential to Achieve Goals: Good    Frequency Min 3X/week   Barriers to discharge        Co-evaluation               End of Session Equipment Utilized During Treatment: Gait belt Activity Tolerance: Treatment limited secondary to medical complications (Comment) (HR up to  140's a-fib) Patient left: in chair;with call bell/phone within reach;with chair alarm set;with family/visitor present           Time: 1202-1225 PT Time Calculation (min): 23 min   Charges:   PT Evaluation $Initial PT Evaluation Tier I: 1 Procedure PT Treatments $Therapeutic Activity: 8-22 mins   PT G Codes:          Max Sane 05/13/2013, 12:49 PM Magda Kiel, [place] 05/13/2013

## 2013-05-13 NOTE — Progress Notes (Signed)
Rehab Admissions Coordinator Note:  Patient was screened by [facility] A[facility] Strauch for appropriateness for an Inpatient Acute Rehab Consult.  At this time, we are recommending Inpatient Rehab consult.  Moe Graca, PT Rehabilitation Admissions Coordinator [phone]  

## 2013-05-13 NOTE — Progress Notes (Signed)
Patient is out of bed per recliner with chair alarm on.  He is more alert and oriented and can follow simple instructions.  Denies of pain.

## 2013-05-13 NOTE — Evaluation (Signed)
Clinical/Bedside Swallow Evaluation Patient Details  Name: Joseph Hawkins MRN: [id number] Date of Birth: 10/05/1934  Today's Date: 05/13/2013 Time: 6073-7106 SLP Time Calculation (min): 14 min  Past Medical History:  Past Medical History  Diagnosis Date  . Hypertension   . Chronic kidney disease   . Anemia    Past Surgical History:  Past Surgical History  Procedure Laterality Date  . Radiology with anesthesia N/A 05/07/2013    Procedure: RADIOLOGY WITH ANESTHESIA;  Surgeon: Jacqulynn Cadet, MD;  Location: [facility];  Service: Radiology;  Laterality: N/A;   HPI:  79 yo with alcoholic cirrhosis and varices who presented to [facility] with hematemesis due to variceal bleed. Underwent EGD and banding of esophageal varices x4 but had further hematemesis presumed to be due to gastric varices. Intubated 5/6 to 5/8. Transferred to [facility], underwent Balloon-occluded retrograde transvenous obliteration of gastric varices performed by IR.    Assessment / Plan / Recommendation Clinical Impression  Pt demonstrates immediate evidence of aspiration/dysphagia with thin and puree textures (cough, weak multiple swallows). Suspect weakness and residuals also complicated by presence of large NG tube. Pt is alert but deconditioned and confused. Recommend another day of recovery with TF, SLP to f/u tomorrow for MBS. Pt will likely need NG removed mid-study if initial results indicate ability to tolerate a modified diet. MD please specifiy in your note if it is ok for radiology tech to remove pts NG during tomorrow's MBS.     Aspiration Risk  Moderate    Diet Recommendation NPO;Alternative means - temporary   Medication Administration: Via alternative means    Other  Recommendations Recommended Consults: MBS Oral Care Recommendations: Oral care Q4 per protocol   Follow Up Recommendations  Inpatient Rehab    Frequency and Duration        Pertinent Vitals/Pain NA    SLP Swallow Goals     Swallow Study Prior  Functional Status       General HPI: 79 yo with alcoholic cirrhosis and varices who presented to [facility] with hematemesis due to variceal bleed. Underwent EGD and banding of esophageal varices x4 but had further hematemesis presumed to be due to gastric varices. Intubated 5/6 to 5/8. Transferred to [facility], underwent Balloon-occluded retrograde transvenous obliteration of gastric varices performed by IR.  Type of Study: Bedside swallow evaluation Previous Swallow Assessment: none Diet Prior to this Study: NPO (large NG being used for TF, not suction) Temperature Spikes Noted: No Respiratory Status: Room air History of Recent Intubation: Yes Length of Intubations (days): 3 days Date extubated: 05/08/13 Behavior/Cognition: Alert;Requires cueing;Confused Oral Cavity - Dentition: Adequate natural dentition Self-Feeding Abilities: Needs assist Patient Positioning: Upright in bed Baseline Vocal Quality: Hoarse;Low vocal intensity Volitional Cough: Weak Volitional Swallow: Able to elicit    Oral/Motor/Sensory Function Overall Oral Motor/Sensory Function: Appears within functional limits for tasks assessed   Ice Chips     Thin Liquid Thin Liquid: Impaired Presentation: Cup;Self Fed Pharyngeal  Phase Impairments: Suspected delayed Swallow;Decreased hyoid-laryngeal movement;Multiple swallows;Cough - Immediate    Nectar Thick Nectar Thick Liquid: Not tested   Honey Thick Honey Thick Liquid: Not tested   Puree Puree: Impaired Presentation: Spoon Pharyngeal Phase Impairments: Suspected delayed Swallow;Decreased hyoid-laryngeal movement;Multiple swallows;Throat Clearing - Immediate   Solid   GO    Solid: Not tested      Joseph Baltimore, MA CCC-SLP [place] Joseph Hawkins 05/13/2013,9:01 AM

## 2013-05-13 NOTE — Progress Notes (Signed)
[place] TEAM 1 - Stepdown/ICU TEAM Progress Note  Dillinger Aston PPJ:[id number] DOB: 1934/01/25 DOA: 05/06/2013 PCP: Lottie Mussel, MD  Admit HPI / Brief Narrative: 79 yo with alcoholic cirrhosis and varices who presented to [facility] with hematemesis due to variceal bleed. Underwent EGD and banding of esophageal varices but had further hematemesis presumed to be due to gastric varices. Transferred to [facility] in consideration of IR procedure.   SIGNIFICANT EVENTS / STUDIES:  5/5 - presented to [facility] with UGIB. Bands esoph x 4 5/06 increased agitation/EtOH withdrawal syndrome. Intubated to permit diagnostic and therapeutic procedures 5/6 CTA abd > Gastric varices involving the gastric fundus and cardia. These varices are draining into the left renal vein and consistent with a gastrorenal shunt. No significant esophageal varices.  ETT 5/6 >>> 5/8 5/7 Balloon-occluded retrograde transvenous obliteration of gastric varices performed by IR  HPI/Subjective: Up in chair. Wife in room. Denies pain.  Assessment/Plan: New Atrial Fibrillation with RVR Likely from [facility] noting Na up to 148 Begin BB per tube Ck ECHO and EKG  Dehydration with hypernatremia Presumed from diarrhea  Increase IVFs to 50/hr and increase free water per tube  Esophageal / gastric varices /S/p BRTO Patient appears to be stabilizing post hemostatic procedures (coil embolization proximal gastro-renal shunt 5/7) per IR  Upper GI hemorrhage Patient appears to be stabilizing post hemostatic procedures-hgb stable  Acute blood loss anemia Hb stable - follow trend  Cirrhosis/Coagulopathy/ Thrombocytopenia PT/INR mildly elevated Due to alcohol/liver disease - platelet count climbing Cirrhotic changes on CT but no splenomegaly Child Pugh score 10 c/w Class C Given diarrhea will dc Lactulose but cont Rifaximin and ck ammonia level in am No evidence SBP and has been on Rocephin x 8 days so will dc  Diarrhea Likely from Lactulose Has  been on Rocephin so consider C Diff- ck PCR  Acute encephalopathy  Improving--ammonia level earlier in hospitalization was 33-repeat in am- stopping Lactulose but continuing Rifaximin  Dysphagia/protein calorie malnutrition Continue TF per NG SLP eval   Alcohol abuse  Acute respiratory failure in setting of acute encephalopathy resolved  Hemorrhagic shock resolved  Hyperchloremic metabolic acidosis Change IVF to NS at [facility] since tolerating TF Begin free water PT Follow lytes   Hypokalemia Improving with replacement per tube  Code Status: FULL Family Communication: Wife at bedside Disposition Plan: SDU-PT/OT eval  Consultants: PCCM > TRH IR  Antibiotics: Ceftriaxone 5/6 >  DVT prophylaxis: SCDs  Objective: Blood pressure 145/78, pulse 59, temperature 98.2 F (36.8 C), temperature source Oral, resp. rate 22, height 6\' 1"  (1.854 m), weight 94.2 kg (207 lb 10.8 oz), SpO2 93.00%.  Intake/Output Summary (Last 24 hours) at 05/13/13 1736 Last data filed at 05/13/13 1600  Gross per 24 hour  Intake   2475 ml  Output   1700 ml  Net    775 ml   Exam: General: No acute respiratory distress evident - patient lethargic Lungs: Clear to auscultation bilaterally without wheezes or crackles, RA Cardiovascular: Irregular rate and has developed atrial fib rhythm without murmur gallop or rub normal S1 and S2 Abdomen: Nontender, nondistended, soft, bowel sounds positive, no rebound, no ascites, no appreciable mass-NGT with TF infusing-flexiseal with loose liquid BMs Extremities: No significant cyanosis, clubbing, or edema bilateral lower extremities  Data Reviewed: Basic Metabolic Panel:  Recent Labs Lab 05/09/13 0500 05/10/13 0315 05/11/13 0330 05/12/13 0515 05/13/13 0556  NA 141 139 142 144 148*  K 4.3 3.4* 3.7 3.5* 3.7  CL 114* 111 114* 117*  119*  CO2 17* 18* 16* 17* 18*  GLUCOSE 122* 120* 125* 127* 156*  BUN 23 17 17 19 22   CREATININE 1.07 0.95 0.94 1.03 1.04    CALCIUM 7.7* 7.8* 8.4 8.2* 8.9   Liver Function Tests:  Recent Labs Lab 05/07/13 0415 05/08/13 0410 05/09/13 0500 05/10/13 0315 05/12/13 0515  AST 39* 58* 56* 35 33  ALT 17 20 20 15 12   ALKPHOS 171* 152* 173* 161* 199*  BILITOT 1.5* 1.7* 2.4* 3.2* 2.7*  PROT 5.3* 5.0* 5.6* 5.6* 5.7*  ALBUMIN 2.0* 1.9* 2.0* 1.9* 1.8*    Recent Labs Lab 05/12/13 0500  AMMONIA 33    CBC:  Recent Labs Lab 05/10/13 0315 05/10/13 0830 05/11/13 0330 05/12/13 0515 05/13/13 0556  WBC 10.1 9.2 10.6* 11.4* 12.0*  HGB 9.2* 9.1* 9.3* 9.0* 9.2*  HCT 28.1* 27.7* 28.5* 28.1* 28.6*  MCV 89.5 89.6 90.2 89.2 90.5  PLT 67* 76* 102* 128* 133*   CBG:  Recent Labs Lab 05/13/13 0100 05/13/13 0435 05/13/13 0759 05/13/13 1139 05/13/13 1725  GLUCAP 116* 125* 105* 104* 133*    Recent Results (from the past 240 hour(s))  MRSA PCR SCREENING     Status: None   Collection Time    05/06/13  1:04 AM      Result Value Ref Range Status   MRSA by PCR NEGATIVE  NEGATIVE Final   Comment:            The GeneXpert MRSA Assay (FDA     approved for NASAL specimens     only), is one component of a     comprehensive MRSA colonization     surveillance program. It is not     intended to diagnose MRSA     infection nor to guide or     monitor treatment for     MRSA infections.  CLOSTRIDIUM DIFFICILE BY PCR     Status: None   Collection Time    05/13/13  1:18 PM      Result Value Ref Range Status   C difficile by pcr NEGATIVE  NEGATIVE Final     Studies:  Recent x-ray studies have been reviewed in detail by the Attending Physician  Scheduled Meds:  Scheduled Meds: . antiseptic oral rinse  15 mL Mouth Rinse QID  . folic acid  1 mg Intravenous Daily  . free water  200 mL Per Tube Q6H  . metoprolol tartrate  25 mg Per Tube BID  . pantoprazole (PROTONIX) IV  40 mg Intravenous Q24H  . rifaximin  550 mg Per Tube BID  . thiamine  100 mg Intravenous Daily    Time spent on care of this patient: 35  mins   Allie Bossier , ANP   [facility] Office  [phone] Pager - Text Page per Shea Evans as per below:  On-Call/Text Page:      [email or web address]      password [email or web address]  If 7PM-7AM, please contact night-coverage [email or web address] Password [email or web address] 05/13/2013, 5:36 PM   LOS: 7 days    Examining patient with ANP Ebony Hail and discussed assessment and plan of care. Agree with plan of care. Discuss plan of care with patient and answered all questions. Patient care greater than 35 minute

## 2013-05-14 ENCOUNTER — Other Ambulatory Visit: Payer: Self-pay | Admitting: Interventional Radiology

## 2013-05-14 ENCOUNTER — Inpatient Hospital Stay (HOSPITAL_COMMUNITY): Payer: Medicare Other

## 2013-05-14 DIAGNOSIS — R5381 Other malaise: Secondary | ICD-10-CM

## 2013-05-14 DIAGNOSIS — I517 Cardiomegaly: Secondary | ICD-10-CM

## 2013-05-14 DIAGNOSIS — R197 Diarrhea, unspecified: Secondary | ICD-10-CM

## 2013-05-14 DIAGNOSIS — I864 Gastric varices: Secondary | ICD-10-CM

## 2013-05-14 LAB — COMPREHENSIVE METABOLIC PANEL
ALT: 22 U/L (ref 0–53)
AST: 59 U/L — ABNORMAL HIGH (ref 0–37)
Albumin: 2 g/dL — ABNORMAL LOW (ref 3.5–5.2)
Alkaline Phosphatase: 337 U/L — ABNORMAL HIGH (ref 39–117)
BUN: 23 mg/dL (ref 6–23)
CALCIUM: 9.1 mg/dL (ref 8.4–10.5)
CO2: 17 mEq/L — ABNORMAL LOW (ref 19–32)
Chloride: 120 mEq/L — ABNORMAL HIGH (ref 96–112)
Creatinine, Ser: 1.03 mg/dL (ref 0.50–1.35)
GFR calc non Af Amer: 68 mL/min — ABNORMAL LOW (ref >90)
GFR, EST AFRICAN AMERICAN: 79 mL/min — AB (ref >90)
Glucose, Bld: 128 mg/dL — ABNORMAL HIGH (ref 70–99)
Potassium: 3.9 mEq/L (ref 3.7–5.3)
SODIUM: 149 meq/L — AB (ref 137–147)
TOTAL PROTEIN: 6.3 g/dL (ref 6.0–8.3)
Total Bilirubin: 2.3 mg/dL — ABNORMAL HIGH (ref 0.3–1.2)

## 2013-05-14 LAB — PROTIME-INR
INR: 1.57 — ABNORMAL HIGH (ref 0.00–1.49)
PROTHROMBIN TIME: 18.3 s — AB (ref 11.6–15.2)

## 2013-05-14 LAB — CBC
HCT: 29.4 % — ABNORMAL LOW (ref 39.0–52.0)
Hemoglobin: 9.3 g/dL — ABNORMAL LOW (ref 13.0–17.0)
MCH: 28.8 pg (ref 26.0–34.0)
MCHC: 31.6 g/dL (ref 30.0–36.0)
MCV: 91 fL (ref 78.0–100.0)
Platelets: 165 10*3/uL (ref 150–400)
RBC: 3.23 MIL/uL — ABNORMAL LOW (ref 4.22–5.81)
RDW: 21 % — ABNORMAL HIGH (ref 11.5–15.5)
WBC: 13.3 10*3/uL — ABNORMAL HIGH (ref 4.0–10.5)

## 2013-05-14 LAB — GLUCOSE, CAPILLARY
GLUCOSE-CAPILLARY: 138 mg/dL — AB (ref 70–99)
Glucose-Capillary: 114 mg/dL — ABNORMAL HIGH (ref 70–99)
Glucose-Capillary: 120 mg/dL — ABNORMAL HIGH (ref 70–99)
Glucose-Capillary: 122 mg/dL — ABNORMAL HIGH (ref 70–99)
Glucose-Capillary: 122 mg/dL — ABNORMAL HIGH (ref 70–99)
Glucose-Capillary: 127 mg/dL — ABNORMAL HIGH (ref 70–99)

## 2013-05-14 LAB — AMMONIA: Ammonia: 37 umol/L (ref 11–60)

## 2013-05-14 MED ORDER — STARCH (THICKENING) PO POWD: ORAL | Status: DC | PRN

## 2013-05-14 MED ORDER — RESOURCE THICKENUP CLEAR PO POWD
ORAL | Status: DC | PRN
  Filled 2013-05-14: qty 125

## 2013-05-14 MED FILL — Medication: Qty: 1 | Status: AC

## 2013-05-14 MED FILL — Sodium Tetradecyl Sulfate Inj 1%: INTRAVENOUS | Qty: 8 | Status: AC

## 2013-05-14 NOTE — Progress Notes (Signed)
[place] TEAM 1 - Stepdown/ICU TEAM Progress Note  Joseph Hawkins FUX:[id number] DOB: [birth year] DOA: 05/06/2013 PCP: Lottie Mussel, MD  Admit HPI / Brief Narrative: 79 yo with alcoholic cirrhosis and varices who presented to [facility] with hematemesis due to variceal bleed. Underwent EGD and banding of esophageal varices but had further hematemesis presumed to be due to gastric varices. Transferred to [facility] in consideration of IR procedure.   SIGNIFICANT EVENTS / STUDIES:  5/5 - presented to [facility] with UGIB. Bands esoph x 4 5/06 increased agitation/EtOH withdrawal syndrome. Intubated to permit diagnostic and therapeutic procedures 5/6 CTA abd > Gastric varices involving the gastric fundus and cardia. These varices are draining into the left renal vein and consistent with a gastrorenal shunt. No significant esophageal varices.  ETT 5/6 >>> 5/8 5/7 Balloon-occluded retrograde transvenous obliteration of gastric varices performed by IR  HPI/Subjective: In bed-mildly confused but pleasant, requiring mittens. No complaints verbalize  Assessment/Plan: New Atrial Fibrillation with RVR Likely from [facility] noting Na up to 148 Began BB 05/13/2013 and now rate controlled ECHO with preserved LV fnx and no RWMA Recent GIB and also alcoholic so not appropriate candidate for anticoagulation  Dehydration with hypernatremia Presumed from diarrhea -still with persistent loose stools/diarrhea Increase IVFs to 100/hr and encourage oral intake  Esophageal / gastric varices /S/p BRTO Patient appears to be stabilizing post hemostatic procedures (coil embolization proximal gastro-renal shunt 5/7) per IR  Upper GI hemorrhage Patient appears to be stabilizing post hemostatic procedures-hgb stable  Acute blood loss anemia Hb stable - follow trend  Cirrhosis/Coagulopathy/ Thrombocytopenia/transaminitis PT/INR mildly elevated Due to alcohol/liver disease - platelet count climbing Cirrhotic changes on CT but no  splenomegaly Child Pugh score 10 c/w Class C Given diarrhea Lactulose was discontinued 05/13/2048 but cont Rifaximin  Repeat ammonia level 05/14/2013 was 37 and patient remains alert No evidence SBP and has been on Rocephin x 8 days so was discontinued 05/13/2013  Diarrhea Likely from Lactulose C. difficile PCR negative  Acute encephalopathy  Improving--ammonia level earlier in hospitalization was 33-repeat as above- stopped Lactulose but continued Rifaximin  Dysphagia/protein calorie malnutrition SLP repeat evaluation recommends dysphagia 1 diet with nectar thick liquids  Alcohol abuse  Acute respiratory failure in setting of acute encephalopathy resolved  Hemorrhagic shock resolved  Hyperchloremic metabolic acidosis Continue IV fluid hydration-as noted has not anion gap acidosis related to ongoing diarrhea Follow lytes   Hypokalemia Improving with replacement   Code Status: FULL Family Communication: Wife at bedside Disposition Plan: SDU-PT/OT eval-possibility of discharge to CIR  Consultants: PCCM > TRH IR  Antibiotics: Ceftriaxone 5/6 >5/13  DVT prophylaxis: SCDs  Objective: Blood pressure 150/73, pulse 46, temperature 98.8 F (37.1 C), temperature source Oral, resp. rate 26, height 6\' 1"  (1.854 m), weight 206 lb 9.1 oz (93.7 kg), SpO2 96.00%.  Intake/Output Summary (Last 24 hours) at 05/14/13 1410 Last data filed at 05/14/13 1100  Gross per 24 hour  Intake   2540 ml  Output   1800 ml  Net    740 ml   Exam: General: No acute respiratory distress evident  Lungs: Clear to auscultation bilaterally without wheezes or crackles, RA Cardiovascular: Irregular rate and has developed atrial fib rhythm without murmur gallop or rub normal S1 and S2 Abdomen: Nontender, nondistended, soft, bowel sounds positive, no rebound, no ascites, no appreciable mass-NGT has been discontinued-flexiseal with loose liquid BMs Extremities: No significant cyanosis, clubbing, or  edema bilateral lower extremities  Data Reviewed: Basic Metabolic Panel:  Recent Labs Lab  05/10/13 0315 05/11/13 0330 05/12/13 0515 05/13/13 0556 05/14/13 0441  NA 139 142 144 148* 149*  K 3.4* 3.7 3.5* 3.7 3.9  CL 111 114* 117* 119* 120*  CO2 18* 16* 17* 18* 17*  GLUCOSE 120* 125* 127* 156* 128*  BUN 17 17 19 22 23   CREATININE 0.95 0.94 1.03 1.04 1.03  CALCIUM 7.8* 8.4 8.2* 8.9 9.1   Liver Function Tests:  Recent Labs Lab 05/08/13 0410 05/09/13 0500 05/10/13 0315 05/12/13 0515 05/14/13 0441  AST 58* 56* 35 33 59*  ALT 20 20 15 12 22   ALKPHOS 152* 173* 161* 199* 337*  BILITOT 1.7* 2.4* 3.2* 2.7* 2.3*  PROT 5.0* 5.6* 5.6* 5.7* 6.3  ALBUMIN 1.9* 2.0* 1.9* 1.8* 2.0*    Recent Labs Lab 05/12/13 0500 05/14/13 0430  AMMONIA 33 37    CBC:  Recent Labs Lab 05/10/13 0830 05/11/13 0330 05/12/13 0515 05/13/13 0556 05/14/13 0441  WBC 9.2 10.6* 11.4* 12.0* 13.3*  HGB 9.1* 9.3* 9.0* 9.2* 9.3*  HCT 27.7* 28.5* 28.1* 28.6* 29.4*  MCV 89.6 90.2 89.2 90.5 91.0  PLT 76* 102* 128* 133* 165   CBG:  Recent Labs Lab 05/13/13 2348 05/13/13 2354 05/14/13 0425 05/14/13 0737 05/14/13 1214  GLUCAP 113* 122* 120* 122* 114*    Recent Results (from the past 240 hour(s))  MRSA PCR SCREENING     Status: None   Collection Time    05/06/13  1:04 AM      Result Value Ref Range Status   MRSA by PCR NEGATIVE  NEGATIVE Final   Comment:            The GeneXpert MRSA Assay (FDA     approved for NASAL specimens     only), is one component of a     comprehensive MRSA colonization     surveillance program. It is not     intended to diagnose MRSA     infection nor to guide or     monitor treatment for     MRSA infections.  CLOSTRIDIUM DIFFICILE BY PCR     Status: None   Collection Time    05/13/13  1:18 PM      Result Value Ref Range Status   C difficile by pcr NEGATIVE  NEGATIVE Final     Studies:  Recent x-ray studies have been reviewed in detail by the Attending  Physician  Scheduled Meds:  Scheduled Meds: . antiseptic oral rinse  15 mL Mouth Rinse QID  . folic acid  1 mg Intravenous Daily  . free water  200 mL Per Tube Q6H  . metoprolol tartrate  25 mg Per Tube BID  . pantoprazole (PROTONIX) IV  40 mg Intravenous Q24H  . rifaximin  550 mg Per Tube BID  . thiamine  100 mg Intravenous Daily    Time spent on care of this patient: 35 mins   Samella Parr , ANP   [facility] Office  [phone] Pager - Text Page per Shea Evans as per below:  On-Call/Text Page:      [email or web address]      password [email or web address]  If 7PM-7AM, please contact night-coverage [email or web address] Password [email or web address] 05/14/2013, 2:10 PM   LOS: 8 days    Examined patient and discussed assessment and plan with ANP Ebony Hail and agree with plan.  Plan discussed with patient, family and all questions answered  Greater than 35 minutes was direct patient care

## 2013-05-14 NOTE — Progress Notes (Signed)
OK to remove NGT mid MBSS if indicated to complete an accurate study. If study indicates need for alternative means of nutrition OK for radiology to place smaller bore PANDA style tube.   Erin Hearing, ANP  Examined patient and discussed assessment and plan with ANP Ebony Hail and agree with plan.

## 2013-05-14 NOTE — Procedures (Signed)
Objective Swallowing Evaluation: Modified Barium Swallowing Study  Patient Details  Name: Joseph Hawkins MRN: [id number] Date of Birth: 1934/06/17  Today's Date: 05/14/2013 Time: 1000-1030 SLP Time Calculation (min): 30 min  Past Medical History:  Past Medical History  Diagnosis Date  . Hypertension   . Chronic kidney disease   . Anemia    Past Surgical History:  Past Surgical History  Procedure Laterality Date  . Radiology with anesthesia N/A 05/07/2013    Procedure: RADIOLOGY WITH ANESTHESIA;  Surgeon: Jacqulynn Cadet, MD;  Location: [place];  Service: Radiology;  Laterality: N/A;   HPI:  79 yo with alcoholic cirrhosis and varices who presented to [facility] with hematemesis due to variceal bleed. Underwent EGD and banding of esophageal varices x4 but had further hematemesis presumed to be due to gastric varices. Intubated 5/6 to 5/8. Transferred to [facility], underwent Balloon-occluded retrograde transvenous obliteration of gastric varices performed by IR.      Assessment / Plan / Recommendation Clinical Impression  Dysphagia Diagnosis: Moderate oral phase dysphagia;Mild oral phase dysphagia;Moderate pharyngeal phase dysphagia Clinical impression: Pt presents with a mild to moderate oral and oropharyngeal dysphagia, improving throughout test with reeapted trials. Pt with dry mucosa, likely contributing to residuals and decreased sensation. Oral phase characterized by pumping mechanism with piecemeal transit of boluses to pharynx and mild lingual residuals. Swallow response is delayed with resulting silent aspiration before the swallow with thin liquids. Nectar thick liquids did not penetrate the airway despite pooling in pyriforms. Base of tongue and epiglottic delfection and laryngeal elevation are all mildly weak, leaving moderate vallecular residuals that are transited with a spontaneous second swallow, except with mechanical soft (severe residue). Recomend pt initiate a dys 1 (puree ) diet and nectar  thick liquids, following solids with liquids. Pts solids can be upgraded at bedside. May need repeat testing prior to upgrade to thin given silent aspiration.     Treatment Recommendation  Therapy as outlined in treatment plan below    Diet Recommendation Dysphagia 1 (Puree);Nectar-thick liquid   Liquid Administration via: Cup;Straw Medication Administration: Whole meds with liquid Supervision: Patient able to self feed;Full supervision/cueing for compensatory strategies Compensations: Slow rate;Small sips/bites;Follow solids with liquid;Multiple dry swallows after each bite/sip Postural Changes and/or Swallow Maneuvers: Out of bed for meals;Seated upright 90 degrees    Other  Recommendations Oral Care Recommendations: Oral care BID   Follow Up Recommendations  Inpatient Rehab    Frequency and Duration min 2x/week  1 week   Pertinent Vitals/Pain NA    SLP Swallow Goals     General HPI: 79 yo with alcoholic cirrhosis and varices who presented to [facility] with hematemesis due to variceal bleed. Underwent EGD and banding of esophageal varices x4 but had further hematemesis presumed to be due to gastric varices. Intubated 5/6 to 5/8. Transferred to [facility], underwent Balloon-occluded retrograde transvenous obliteration of gastric varices performed by IR.  Type of Study: Modified Barium Swallowing Study Reason for Referral: Objectively evaluate swallowing function Previous Swallow Assessment: none Diet Prior to this Study: NPO (large NG) Temperature Spikes Noted: No Respiratory Status: Room air History of Recent Intubation: Yes Length of Intubations (days): 3 days Date extubated: 05/08/13 Behavior/Cognition: Alert;Cooperative;Requires cueing Oral Cavity - Dentition: Adequate natural dentition Oral Motor / Sensory Function: Within functional limits Self-Feeding Abilities: Able to feed self Patient Positioning: Upright in chair Baseline Vocal Quality: Hoarse;Low vocal intensity Volitional  Cough: Strong Volitional Swallow: Able to elicit Anatomy: Within functional limits Pharyngeal Secretions: Not observed secondary MBS (pt cleared  secretions well when NG pulled)    Reason for Referral Objectively evaluate swallowing function   Oral Phase Oral Preparation/Oral Phase Oral Phase: Impaired Oral - Nectar Oral - Nectar Cup: Lingual pumping;Reduced posterior propulsion;Lingual/palatal residue;Delayed oral transit Oral - Nectar Straw: Lingual pumping;Reduced posterior propulsion;Lingual/palatal residue;Delayed oral transit Oral - Thin Oral - Thin Cup: Lingual pumping;Reduced posterior propulsion;Lingual/palatal residue;Delayed oral transit Oral - Solids Oral - Puree: Lingual pumping;Reduced posterior propulsion;Lingual/palatal residue;Delayed oral transit Oral - Mechanical Soft: Lingual pumping;Reduced posterior propulsion;Lingual/palatal residue;Delayed oral transit;Impaired mastication   Pharyngeal Phase Pharyngeal Phase Pharyngeal Phase: Impaired Pharyngeal - Nectar Pharyngeal - Nectar Cup: Delayed swallow initiation;Premature spillage to pyriform sinuses;Reduced epiglottic inversion;Reduced tongue base retraction;Reduced laryngeal elevation;Pharyngeal residue - valleculae;Pharyngeal residue - pyriform sinuses Pharyngeal - Nectar Straw: Delayed swallow initiation;Premature spillage to pyriform sinuses;Reduced epiglottic inversion;Reduced tongue base retraction;Reduced laryngeal elevation;Pharyngeal residue - valleculae;Pharyngeal residue - pyriform sinuses Pharyngeal - Thin Pharyngeal - Thin Cup: Delayed swallow initiation;Premature spillage to pyriform sinuses;Reduced epiglottic inversion;Reduced tongue base retraction;Reduced laryngeal elevation;Pharyngeal residue - valleculae;Pharyngeal residue - pyriform sinuses;Penetration/Aspiration before swallow;Penetration/Aspiration during swallow;Trace aspiration Penetration/Aspiration details (thin cup): Material enters airway, passes  BELOW cords without attempt by patient to eject out (silent aspiration);Material enters airway, CONTACTS cords then ejected out;Material enters airway, CONTACTS cords and not ejected out;Material does not enter airway Pharyngeal - Solids Pharyngeal - Puree: Delayed swallow initiation;Premature spillage to pyriform sinuses;Reduced epiglottic inversion;Reduced tongue base retraction;Reduced laryngeal elevation;Pharyngeal residue - valleculae;Pharyngeal residue - pyriform sinuses Pharyngeal - Mechanical Soft: Delayed swallow initiation;Premature spillage to pyriform sinuses;Reduced epiglottic inversion;Reduced tongue base retraction;Reduced laryngeal elevation;Pharyngeal residue - valleculae;Pharyngeal residue - pyriform sinuses Pharyngeal - Pill: Delayed swallow initiation;Premature spillage to pyriform sinuses;Reduced epiglottic inversion;Reduced tongue base retraction;Reduced laryngeal elevation;Pharyngeal residue - valleculae;Pharyngeal residue - pyriform sinuses  Cervical Esophageal Phase    GO             Herbie Baltimore, MA CCC-SLP [phone]  Katherene Ponto Nakhia Levitan 05/14/2013, 11:37 AM

## 2013-05-14 NOTE — Progress Notes (Addendum)
Patient ID: Joseph Hawkins, male   DOB: August 11, 1934, 79 y.o.   MRN: [id number] Pt without further bleeding/hematemesis. Wife in room. Little more alert today. Denies pain. Reminded of f/u appt with Dr. Laurence Ferrari in 4 weeks in [place] [facility] [phone]). Will plan lab check and f/u CT at that time.

## 2013-05-14 NOTE — Evaluation (Signed)
Occupational Therapy Evaluation Patient Details Name: Joseph Hawkins MRN: [id number] DOB: March 19, 1934 Today's Date: 05/14/2013    History of Present Illness Patient is a 79 y/o male with h/o ETOH cirrhosis and esophogeal varices s/p banding, admitted with hematemesis with esophogeal bleeding,  underwent BRTO procedure 05/07/13. intubated 5/6-5/8.   Clinical Impression   Pt presents with decreased activity tolerance and tachycardia with activity.  He demonstrates impaired cognition and dependence in all ADL.  He requires +2 assist for OOB, primarily due to multiple lines/safety. Pt is cooperative and participatory. Prior to admission, pt was independent in ADL and IADL.  Pt will benefit from intensive rehab prior to d/c home with his supportive wife.  Will follow.   Follow Up Recommendations  CIR;Supervision/Assistance - 24 hour    Equipment Recommendations       Recommendations for Other Services Rehab consult     Precautions / Restrictions Precautions Precautions: Fall Precaution Comments: rectal tube, condom cath      Mobility Bed Mobility Overal bed mobility: Needs Assistance Bed Mobility: Supine to Sit     Supine to sit: HOB elevated;Min guard (with rail)        Transfers Overall transfer level: Needs assistance   Transfers: Sit to/from Stand Sit to Stand: +2 safety/equipment;Mod assist              Balance Overall balance assessment: Needs assistance Sitting-balance support: No upper extremity supported Sitting balance-Leahy Scale: Good     Standing balance support: No upper extremity supported Standing balance-Leahy Scale: Poor                              ADL Overall ADL's : Needs assistance/impaired Eating/Feeding: Supervision/ safety;Set up;Sitting   Grooming: Wash/dry hands;Wash/dry face;Set up;Sitting   Upper Body Bathing: Minimal assitance;Sitting   Lower Body Bathing: Sit to/from stand;Maximal assistance   Upper Body Dressing :  Minimal assistance;Sitting   Lower Body Dressing: Maximal assistance;Sit to/from stand   Toilet Transfer: Minimal assistance;+2 for safety/equipment           Functional mobility during ADLs: +2 for safety/equipment;Minimal assistance General ADL Comments: Pt's HR increased to 134 with donning sock, assisted for second sock.  Able to cross his foot over opposite knee.     Vision                     Perception     Praxis      Pertinent Vitals/Pain HR to 144 after transfer to chair, RN aware, no pain     Hand Dominance Right   Extremity/Trunk Assessment Upper Extremity Assessment Upper Extremity Assessment: Overall WFL for tasks assessed   Lower Extremity Assessment Lower Extremity Assessment: Defer to PT evaluation   Cervical / Trunk Assessment Cervical / Trunk Assessment: Normal   Communication Communication Communication: Expressive difficulties   Cognition Arousal/Alertness: Awake/alert Behavior During Therapy: Flat affect Overall Cognitive Status: Impaired/Different from baseline Area of Impairment: Attention;Following commands;Memory;Safety/judgement;Awareness;Orientation Orientation Level: Disoriented to;Place;Time;Situation (thought he was at church) Current Attention Level: Sustained Memory: Decreased recall of precautions;Decreased short-term memory Following Commands: Follows one step commands with increased time Safety/Judgement: Decreased awareness of safety;Decreased awareness of deficits Awareness: Intellectual       General Comments       Exercises       Shoulder Instructions      Home Living Family/patient expects to be discharged to:: Private residence Living Arrangements: Spouse/significant other Available Help at Discharge:  Family Type of Home: Mobile home Home Access: Stairs to enter Entrance Stairs-Number of Steps: 3-4 Entrance Stairs-Rails: Right Home Layout: One level     Bathroom Shower/Tub: Walk-in  shower;Tub/shower unit   Bathroom Toilet: Standard     Home Equipment: None          Prior Functioning/Environment Level of Independence: Independent        Comments: drives, mows    OT Diagnosis: Generalized weakness;Cognitive deficits   OT Problem List: Decreased strength;Decreased activity tolerance;Impaired balance (sitting and/or standing);Decreased cognition;Decreased safety awareness;Decreased knowledge of use of DME or AE   OT Treatment/Interventions: Self-care/ADL training;DME and/or AE instruction;Patient/family education;Balance training;Therapeutic activities;Cognitive remediation/compensation    OT Goals(Current goals can be found in the care plan section) Acute Rehab OT Goals Patient Stated Goal: To return to independent OT Goal Formulation: With patient Time For Goal Achievement: 05/21/13 Potential to Achieve Goals: Good ADL Goals Pt Will Perform Eating: Independently;sitting Pt Will Perform Grooming: with supervision;standing Pt Will Perform Upper Body Bathing: with supervision;sitting Pt Will Perform Lower Body Bathing: with supervision;sit to/from stand Pt Will Perform Upper Body Dressing: with supervision;sitting Pt Will Perform Lower Body Dressing: with supervision;sit to/from stand Pt Will Transfer to Toilet: with supervision;ambulating Pt Will Perform Toileting - Clothing Manipulation and hygiene: with supervision;sit to/from stand  OT Frequency: Min 2X/week   Barriers to D/C:            Co-evaluation              End of Session Equipment Utilized During Treatment: Gait belt Nurse Communication: Mobility status (HR, MD in room when pt's HR increased)  Activity Tolerance: Treatment limited secondary to medical complications (Comment) (tachycardia) Patient left: in chair;with call bell/phone within reach;with nursing/sitter in room;with family/visitor present   Time: 6010-9323 OT Time Calculation (min): 29 min Charges:  OT General  Charges $OT Visit: 1 Procedure OT Evaluation $Initial OT Evaluation Tier I: 1 Procedure OT Treatments $Self Care/Home Management : 8-22 mins G-Codes:    Haze Boyden Joshlynn Alfonzo 07-Jun-2013, 3:09 PM [phone]

## 2013-05-14 NOTE — Consult Note (Signed)
Physical Medicine and Rehabilitation Consult  Reason for Consult: Deconditioning due to GIB with Alcoholic encephalopathy.  Referring Physician:  Dr. Sherral Hammers.    HPI: Joseph Hawkins is a 79 y.o. male with h/o alcoholic cirrhosis, gastric and esophageal varices (with history of bleed) who presented to [facility] on 05/05/13  with hematemesis due to variceal bleed. Underwent EGD and banding X 4 of esophageal varices but had further hematemesis presumed to be due to gastric varices.  Transferred to [facility] on 05/06/13 and required intubation due safety of patient--agitated and combative due to ETOH withdrawal syndrome. He underwent Balloon-occluded retrograde transvenous obliteration of gastric varices performed by Dr. Kathlene Cote on 05/07/13 and patient extubated without difficulty on 05/08/13. ABLA stable and thrombocytopenia resolving. Mentation improving and NGT to be d/c today in anticipation of MBS. Therapy evaluations initiated and rehab team recommending CIR.   Patient denies any pain complaints. No nausea or vomiting.  constipated to    Review of Systems  HENT: Negative for hearing loss.   Eyes: Negative for blurred vision.  Respiratory: Positive for cough and sputum production.   Cardiovascular: Negative for chest pain and palpitations.  Gastrointestinal: Negative for heartburn and nausea.  Genitourinary: Negative for dysuria and frequency.  Musculoskeletal: Negative for myalgias.  Neurological: Negative for headaches.  Psychiatric/Behavioral: Positive for memory loss.    Past Medical History  Diagnosis Date  . Hypertension   . Chronic kidney disease   . Anemia    Past Surgical History  Procedure Laterality Date  . Radiology with anesthesia N/A 05/07/2013    Procedure: RADIOLOGY WITH ANESTHESIA;  Surgeon: Jacqulynn Cadet, MD;  Location: [place];  Service: Radiology;  Laterality: N/A;   Family History  Problem Relation Age of Onset  . Family history unknown: Yes   Social History:   Married. 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 He reports that he has never smoked. He has never used smokeless tobacco. He denies using alcohol--but wife reports continued and worsening of alcohol abuse due to family stressors. He reports that he does not use illicit drugs.  Allergies: No Known Allergies  Medications Prior to Admission  Medication Sig Dispense Refill  . allopurinol (ZYLOPRIM) 300 MG tablet Take 300 mg by mouth daily.      Marland Kitchen aspirin EC 81 MG tablet Take 81 mg by mouth daily.      . cholecalciferol (VITAMIN D) 1000 UNITS tablet Take 1,000 Units by mouth daily.      . ferrous sulfate (FERROUSUL) 325 (65 FE) MG tablet Take 325 mg by mouth daily with breakfast.      . lansoprazole (PREVACID) 15 MG capsule Take 15 mg by mouth daily at 12 noon.      Marland Kitchen levothyroxine (SYNTHROID, LEVOTHROID) 75 MCG tablet Take 75 mcg by mouth daily before breakfast.      . nadolol (CORGARD) 40 MG tablet Take 40 mg by mouth daily.      . pantoprazole (PROTONIX) 40 MG tablet Take 40 mg by mouth 2 (two) times daily.      Marland Kitchen spironolactone (ALDACTONE) 25 MG tablet Take 25 mg by mouth daily.      . sucralfate (CARAFATE) 1 G tablet Take 1 g by mouth 2 (two) times daily.        Home: Home Living Family/patient expects to be discharged to:: Private residence Living Arrangements: Spouse/significant other Available Help at Discharge: Family Type of Home: Mobile home Home Access: Stairs to enter Entrance Stairs-Number of Steps: 3-4 Entrance  Stairs-Rails: Right Home Layout: One level Home Equipment: None  Functional History: Prior Function Level of Independence: Independent Functional Status:  Mobility: Bed Mobility General bed mobility comments: pt in chair Transfers Overall transfer level: Needs assistance Equipment used:  (pulled up on back of chair) Transfers: Sit to/from Stand Sit to Stand: Mod assist General transfer comment: cued for hand placement, assist to lift into  standing Ambulation/Gait Ambulation/Gait assistance:  (NT due to multiple lines)    ADL:    Cognition: Cognition Overall Cognitive Status: Impaired/Different from baseline Orientation Level: Oriented to person;Oriented to place Cognition Arousal/Alertness: Awake/alert Behavior During Therapy: Flat affect Overall Cognitive Status: Impaired/Different from baseline Area of Impairment: Following commands Following Commands: Follows one step commands with increased time  Blood pressure 133/80, pulse 85, temperature 98.7 F (37.1 C), temperature source Oral, resp. rate 22, height 6\' 1"  (1.854 m), weight 94 kg (207 lb 3.7 oz), SpO2 100.00%. Physical Exam  Nursing note and vitals reviewed. Constitutional: He appears well-developed and well-nourished.  HENT:  Head: Normocephalic and atraumatic.  Eyes: Conjunctivae are normal. Pupils are equal, round, and reactive to light.  Neck: Normal range of motion. Neck supple.  Cardiovascular: Normal rate and regular rhythm.   Respiratory: Effort normal. No respiratory distress. He has wheezes.  Cough noted thorough out the exam and needed suctioning to clear adherent secretions.   GI: Soft. Bowel sounds are normal. He exhibits no distension. There is no tenderness.  Neurological: He is alert.  Flat affect. Oriented to self only but able to state that this was 5 th month. Was able to follow simple commands without difficulty.   Skin: Skin is warm and dry.   motor strength is 5/5 bilateral deltoid, bicep, tricep, grip 4/5 bilateral hip flexors knee extensors 5/5 bilateral ankle dorsiflexor plantar flexor Sensory intact to light touch in bilateral upper and lower limbs No evidence of hand or foot intrinsic muscle atrophy. Mild dysmetria finger nose to finger testing bilateral upper extremities and lower extremities  Results for orders placed during the hospital encounter of 05/06/13 (from the past 24 hour(s))  GLUCOSE, CAPILLARY     Status:  Abnormal   Collection Time    05/13/13 11:39 AM      Result Value Ref Range   Glucose-Capillary 104 (*) 70 - 99 mg/dL  CLOSTRIDIUM DIFFICILE BY PCR     Status: None   Collection Time    05/13/13  1:18 PM      Result Value Ref Range   C difficile by pcr NEGATIVE  NEGATIVE  GLUCOSE, CAPILLARY     Status: Abnormal   Collection Time    05/13/13  5:25 PM      Result Value Ref Range   Glucose-Capillary 133 (*) 70 - 99 mg/dL  GLUCOSE, CAPILLARY     Status: Abnormal   Collection Time    05/13/13  7:46 PM      Result Value Ref Range   Glucose-Capillary 119 (*) 70 - 99 mg/dL  GLUCOSE, CAPILLARY     Status: Abnormal   Collection Time    05/13/13 11:48 PM      Result Value Ref Range   Glucose-Capillary 113 (*) 70 - 99 mg/dL  GLUCOSE, CAPILLARY     Status: Abnormal   Collection Time    05/13/13 11:54 PM      Result Value Ref Range   Glucose-Capillary 122 (*) 70 - 99 mg/dL   Comment 1 Notify RN    GLUCOSE, CAPILLARY  Status: Abnormal   Collection Time    05/14/13  4:25 AM      Result Value Ref Range   Glucose-Capillary 120 (*) 70 - 99 mg/dL   Comment 1 Notify RN    AMMONIA     Status: None   Collection Time    05/14/13  4:30 AM      Result Value Ref Range   Ammonia 37  11 - 60 umol/L  CBC     Status: Abnormal   Collection Time    05/14/13  4:41 AM      Result Value Ref Range   WBC 13.3 (*) 4.0 - 10.5 K/uL   RBC 3.23 (*) 4.22 - 5.81 MIL/uL   Hemoglobin 9.3 (*) 13.0 - 17.0 g/dL   HCT 29.4 (*) 39.0 - 52.0 %   MCV 91.0  78.0 - 100.0 fL   MCH 28.8  26.0 - 34.0 pg   MCHC 31.6  30.0 - 36.0 g/dL   RDW 21.0 (*) 11.5 - 15.5 %   Platelets 165  150 - 400 K/uL  COMPREHENSIVE METABOLIC PANEL     Status: Abnormal   Collection Time    05/14/13  4:41 AM      Result Value Ref Range   Sodium 149 (*) 137 - 147 mEq/L   Potassium 3.9  3.7 - 5.3 mEq/L   Chloride 120 (*) 96 - 112 mEq/L   CO2 17 (*) 19 - 32 mEq/L   Glucose, Bld 128 (*) 70 - 99 mg/dL   BUN 23  6 - 23 mg/dL   Creatinine,  Ser 1.03  0.50 - 1.35 mg/dL   Calcium 9.1  8.4 - 10.5 mg/dL   Total Protein 6.3  6.0 - 8.3 g/dL   Albumin 2.0 (*) 3.5 - 5.2 g/dL   AST 59 (*) 0 - 37 U/L   ALT 22  0 - 53 U/L   Alkaline Phosphatase 337 (*) 39 - 117 U/L   Total Bilirubin 2.3 (*) 0.3 - 1.2 mg/dL   GFR calc non Af Amer 68 (*) >90 mL/min   GFR calc Af Amer 79 (*) >90 mL/min  PROTIME-INR     Status: Abnormal   Collection Time    05/14/13  4:41 AM      Result Value Ref Range   Prothrombin Time 18.3 (*) 11.6 - 15.2 seconds   INR 1.57 (*) 0.00 - 1.49  GLUCOSE, CAPILLARY     Status: Abnormal   Collection Time    05/14/13  7:37 AM      Result Value Ref Range   Glucose-Capillary 122 (*) 70 - 99 mg/dL   No results found.  Assessment/Plan: Diagnosis: Deconditioning secondary to variceal bleed 1. Does the need for close, 24 hr/day medical supervision in concert with the patient's rehab needs make it unreasonable for this patient to be served in a less intensive setting? Yes 2. Co-Morbidities requiring supervision/potential complications: Alcoholism, alcoholic cirrhosis, anemia, A. fib RVR 3. Due to bladder management, bowel management, safety, skin/wound care, disease management, medication administration and patient education, does the patient require 24 hr/day rehab nursing? Yes 4. Does the patient require coordinated care of a physician, rehab nurse, PT (1-2 hrs/day, 5 days/week) and OT (1-2 hrs/day, 5 days/week) to address physical and functional deficits in the context of the above medical diagnosis(es)? Yes Addressing deficits in the following areas: balance, endurance, locomotion, strength, transferring, bowel/bladder control, bathing, dressing, grooming and toileting 5. Can the patient actively participate in an intensive  therapy program of at least 3 hrs of therapy per day at least 5 days per week? Yes 6. The potential for patient to make measurable gains while on inpatient rehab is good 7. Anticipated functional outcomes  upon discharge from inpatient rehab are supervision  with PT, supervision with OT, supervision with SLP. 8. Estimated rehab length of stay to reach the above functional goals is: 7-10 days 9. Does the patient have adequate social supports to accommodate these discharge functional goals? Potentially 10. Anticipated D/C setting: Home 11. Anticipated post D/C treatments: [place] therapy 12. Overall Rehab/Functional Prognosis: good  RECOMMENDATIONS: This patient's condition is appropriate for continued rehabilitative care in the following setting: CIR Patient has agreed to participate in recommended program. Yes Note that insurance prior authorization may be required for reimbursement for recommended care.  Comment: May need speech evaluation to evaluate cognition. Has poor safety awareness    05/14/2013

## 2013-05-14 NOTE — Progress Notes (Signed)
Echocardiogram 2D Echocardiogram has been performed.  Joseph Hawkins 05/14/2013, 1:46 PM

## 2013-05-15 LAB — BASIC METABOLIC PANEL
BUN: 25 mg/dL — AB (ref 6–23)
BUN: 25 mg/dL — AB (ref 6–23)
CO2: 16 mEq/L — ABNORMAL LOW (ref 19–32)
CO2: 17 meq/L — AB (ref 19–32)
CREATININE: 1.05 mg/dL (ref 0.50–1.35)
CREATININE: 1.06 mg/dL (ref 0.50–1.35)
Calcium: 8.6 mg/dL (ref 8.4–10.5)
Calcium: 8.8 mg/dL (ref 8.4–10.5)
Chloride: 121 mEq/L — ABNORMAL HIGH (ref 96–112)
Chloride: 119 mEq/L — ABNORMAL HIGH (ref 96–112)
GFR calc Af Amer: 76 mL/min — ABNORMAL LOW (ref >90)
GFR, EST AFRICAN AMERICAN: 77 mL/min — AB (ref >90)
GFR, EST NON AFRICAN AMERICAN: 66 mL/min — AB (ref >90)
GFR, EST NON AFRICAN AMERICAN: 66 mL/min — AB (ref >90)
GLUCOSE: 146 mg/dL — AB (ref 70–99)
Glucose, Bld: 130 mg/dL — ABNORMAL HIGH (ref 70–99)
Potassium: 4.2 mEq/L (ref 3.7–5.3)
Potassium: 4.7 mEq/L (ref 3.7–5.3)
SODIUM: 149 meq/L — AB (ref 137–147)
Sodium: 148 mEq/L — ABNORMAL HIGH (ref 137–147)

## 2013-05-15 LAB — GLUCOSE, CAPILLARY
GLUCOSE-CAPILLARY: 124 mg/dL — AB (ref 70–99)
GLUCOSE-CAPILLARY: 142 mg/dL — AB (ref 70–99)
Glucose-Capillary: 86 mg/dL (ref 70–99)
Glucose-Capillary: 81 mg/dL (ref 70–99)
Glucose-Capillary: 133 mg/dL — ABNORMAL HIGH (ref 70–99)

## 2013-05-15 MED ORDER — VITAMIN B-1 100 MG PO TABS
100.0000 mg | ORAL_TABLET | Freq: Every day | ORAL | Status: DC
  Administered 2013-05-15 – 2013-05-20 (×6): 100 mg via ORAL
  Filled 2013-05-15 (×6): qty 1

## 2013-05-15 MED ORDER — OXYCODONE HCL 5 MG PO TABS
5.0000 mg | ORAL_TABLET | ORAL | Status: DC | PRN
  Administered 2013-05-16: 5 mg via ORAL
  Filled 2013-05-15: qty 1

## 2013-05-15 MED ORDER — RIFAXIMIN 550 MG PO TABS
550.0000 mg | ORAL_TABLET | Freq: Two times a day (BID) | ORAL | Status: DC
  Administered 2013-05-15 – 2013-05-20 (×10): 550 mg via ORAL
  Filled 2013-05-15 (×11): qty 1

## 2013-05-15 MED ORDER — METOPROLOL TARTRATE 25 MG/10 ML ORAL SUSPENSION
25.0000 mg | Freq: Two times a day (BID) | ORAL | Status: DC
  Administered 2013-05-15 – 2013-05-20 (×10): 25 mg via ORAL
  Filled 2013-05-15 (×11): qty 10

## 2013-05-15 MED ORDER — PANTOPRAZOLE SODIUM 40 MG PO TBEC
40.0000 mg | DELAYED_RELEASE_TABLET | Freq: Every day | ORAL | Status: DC
  Administered 2013-05-16 – 2013-05-19 (×4): 40 mg via ORAL
  Filled 2013-05-15 (×3): qty 1

## 2013-05-15 MED ORDER — SODIUM CHLORIDE 0.9 % IJ SOLN
10.0000 mL | INTRAMUSCULAR | Status: DC | PRN
  Administered 2013-05-16 (×3): 10 mL
  Administered 2013-05-16: 20 mL
  Administered 2013-05-17 – 2013-05-19 (×13): 10 mL

## 2013-05-15 MED ORDER — FOLIC ACID 1 MG PO TABS
1.0000 mg | ORAL_TABLET | Freq: Every day | ORAL | Status: DC
  Administered 2013-05-15 – 2013-05-20 (×6): 1 mg via ORAL
  Filled 2013-05-15 (×6): qty 1

## 2013-05-15 MED ORDER — SODIUM CHLORIDE 0.45 % IV SOLN
INTRAVENOUS | Status: DC
  Administered 2013-05-15 – 2013-05-16 (×2): via INTRAVENOUS

## 2013-05-15 NOTE — Care Management Note (Addendum)
    Page 1 of 2   05/20/2013     3:25:02 PM CARE MANAGEMENT NOTE 05/20/2013  Patient:  Joseph Hawkins, Joseph Hawkins   Account Number:  [id number]  Date Initiated:  05/06/2013  Documentation initiated by:  Luz Lex  Subjective/Objective Assessment:   GIB - tx from [place] after rebleed after esophageal banding x4.     Action/Plan:   discharge planning; SNF   Anticipated DC Date:  05/21/2013   Anticipated DC Plan:  SKILLED NURSING FACILITY  In-house referral  Clinical Social Worker      DC Planning Services  CM consult      Choice offered to / List presented to:             Status of service:  Completed, signed off Medicare Important Message given?  YES (If response is "NO", the following Medicare IM given date fields will be blank) Date Medicare IM given:  05/15/2013 Date Additional Medicare IM given:  05/20/2013  Discharge Disposition:  [place]  Per UR Regulation:  Reviewed for med. necessity/level of care/duration of stay  If discussed at [place] of Stay Meetings, dates discussed:   05/12/2013  05/14/2013    Comments:  05/20/13 Ellan Lambert, RN, BSN [phone] Pt for dc to SNF today, per CSW arrangements.  05/19/13 Ellan Lambert, RN, BSN [phone] CSW cont to follow to facilitate dc to SNF when medically stable for dc.  05/15/13 [place] MSN BSN CCM TC received from dtr, [place], who states she is undergoing chemotherapy for dx of breast CA.  Informed her that the plan was ST-SNF for rehab.  She demonstrates understanding and indicated willingness for CSW to call her with bed offers.  05/15/13 16:15 Cm spoke with [name], wife of pt, to confirm SNF request.  [name] requests arrangements be made with her daughter Laurann [place].  Message left with CSW to contact [facility] for SNF arrangements.  No other Cm needs were communicated.  Mariane Masters, [place].  ContactJon Gills   [phone]  05/15/13 [place] RN MSN BSN CCM Therapies  recommend CIR but payor will not approve.  Spouse states she is unable to manage pt @ home and requests SNF for rehab, also wants dtr, Lovey Newcomer, made aware.  TC to dtr, no answer, VM message left requesting return call.  CSW notified.  05-11-13 8:40am [place] [phone] Post BRTO procedure for esophageal variscies on 5-7. Extubated on 05-08-13.  No further bleeding - awaiting SDU bed.

## 2013-05-15 NOTE — Progress Notes (Signed)
Physical Therapy Treatment Patient Details Name: Joseph Hawkins MRN: [id number] DOB: Nov 30, 1934 Today's Date: 05/15/2013    History of Present Illness Patient is a 79 y/o male with h/o ETOH cirrhosis and esophogeal varices s/p banding, admitted with hematemesis with esophogeal bleeding,  underwent BRTO procedure 05/07/13. intubated 5/6-5/8.    PT Comments    Pt progressing towards physical therapy goals. Was able to demonstrate transfers and ambulation with min guard and occasional min assist due to unsteadiness. Followed commands well. Sitter present in room throughout session and setting up lunch tray to assist with feeding at end of session. Will continue to progress per POC.   Follow Up Recommendations  CIR     Equipment Recommendations  Rolling walker with 5" wheels    Recommendations for Other Services Rehab consult     Precautions / Restrictions Precautions Precautions: Fall Precaution Comments: rectal tube, condom cath    Mobility  Bed Mobility Overal bed mobility: Needs Assistance Bed Mobility: Supine to Sit     Supine to sit: Min guard     General bed mobility comments: Pt was able to transition to sitting EOB with HOB flat and min guard for trunk support.   Transfers Overall transfer level: Needs assistance Equipment used: Rolling walker (2 wheeled) Transfers: Sit to/from Stand Sit to Stand: Min assist;+2 safety/equipment         General transfer comment: VC's for hand placement on seated surface for safety. +2 to manage lines.   Ambulation/Gait Ambulation/Gait assistance: Min assist Ambulation Distance (Feet): 75 Feet Assistive device: Rolling walker (2 wheeled) Gait Pattern/deviations: Step-through pattern;Decreased stride length;Trunk flexed Gait velocity: Decreased Gait velocity interpretation: Below normal speed for age/gender General Gait Details: Occasional min assist for unsteadiness with gait. +2 only for management of lines so therapist can have 2  hands on patient.    Stairs            Wheelchair Mobility    Modified Rankin (Stroke Patients Only)       Balance Overall balance assessment: Needs assistance Sitting-balance support: Feet supported;No upper extremity supported Sitting balance-Leahy Scale: Good     Standing balance support: Bilateral upper extremity supported Standing balance-Leahy Scale: Fair Standing balance comment: Feel pt would be able to stand without UE support for brief periods of time without assist.                     Cognition Arousal/Alertness: Awake/alert Behavior During Therapy: Flat affect Overall Cognitive Status: Impaired/Different from baseline Area of Impairment: Safety/judgement;Problem solving         Safety/Judgement: Decreased awareness of safety;Decreased awareness of deficits   Problem Solving: Slow processing;Decreased initiation      Exercises      General Comments        Pertinent Vitals/Pain O2 sats dropped during ambulation. Pt asymptomatic, and attribute drop to pulse-ox on toe while ambulating. When seated sats immediately improved to 95%.    Home Living                      Prior Function            PT Goals (current goals can now be found in the care plan section) Acute Rehab PT Goals Patient Stated Goal: To eat lunch in the chair PT Goal Formulation: With patient/family Time For Goal Achievement: 05/27/13 Potential to Achieve Goals: Good Progress towards PT goals: Progressing toward goals    Frequency  Min 3X/week  PT Plan Current plan remains appropriate    Co-evaluation             End of Session Equipment Utilized During Treatment: Gait belt Activity Tolerance: Patient tolerated treatment well Patient left: in chair;with call bell/phone within reach;with nursing/sitter in room;with family/visitor present     Time: 0569-7948 PT Time Calculation (min): 26 min  Charges:  $Gait Training: 8-22  mins $Therapeutic Activity: 8-22 mins                    G Codes:      Jolyn Lent 05/23/2013, 3:12 PM  Jolyn Lent, PT, DPT Acute Rehabilitation Services Pager: [phone]

## 2013-05-15 NOTE — Progress Notes (Signed)
[employer] Medicare will not approve an inpt rehab admission for this diagnosis at this time. I have alerted RN CM. [phone]

## 2013-05-15 NOTE — Progress Notes (Signed)
[place] TEAM 1 - Stepdown/ICU TEAM Progress Note  Joseph Hawkins EVO:[id number] DOB: January 15, 1934 DOA: 05/06/2013 PCP: Lottie Mussel, MD  Admit HPI / Brief Narrative: 79 yo with alcoholic cirrhosis and varices who presented to [facility] with hematemesis due to variceal bleed. Underwent EGD and banding of esophageal varices but had further hematemesis presumed to be due to gastric varices. Transferred to [facility] in consideration of IR procedure.   SIGNIFICANT EVENTS / STUDIES:  5/5 - presented to [facility] with UGIB. Bands esoph x 4 5/06 increased agitation/EtOH withdrawal syndrome. Intubated to permit diagnostic and therapeutic procedures 5/6 CTA abd > Gastric varices involving the gastric fundus and cardia. These varices are draining into the left renal vein and consistent with a gastrorenal shunt. No significant esophageal varices.  ETT 5/6 >>> 5/8 5/7 Balloon-occluded retrograde transvenous obliteration of gastric varices performed by IR  HPI/Subjective: Sitting up in bed, sitter at bedside, patient requiring mittens.  He responds appropriately to some questions but overall remains quite confused.  Assessment/Plan:  New Atrial Fibrillation with RVR Likely from [facility] noting Na up to 148 05/14/2013-repeat today and in a.m. Began BB 05/13/2013 and now rate controlled ECHO with preserved LV fnx and no RWMA Recent GIB and also alcoholic so not appropriate candidate for anticoagulation  Dehydration with hypernatremia Presumed from diarrhea - still with persistent loose stools/diarrhea though volume much less (only 275 cc past 24 hour) Continue IVFs and encourage oral intake  Esophageal / gastric varices /S/p BRTO Patient appears to be stabilizing post hemostatic procedures per IR  Upper GI hemorrhage Patient appears to be stabilizing post hemostatic procedures - hgb stable  Acute blood loss anemia Hb stable - follow trend  Cirrhosis/Coagulopathy/ Thrombocytopenia/transaminitis PT/INR mildly  elevated Due to alcohol/liver disease - platelet count climbing Cirrhotic changes on CT but no splenomegaly Child Pugh score 10 c/w Class C Given diarrhea Lactulose was discontinued 05/13/2048 but cont Rifaximin  Repeat ammonia level 05/14/2013 was 37 and patient remains alert No evidence SBP and had been on Rocephin x 8 days - discontinued 05/13/2013  Diarrhea Likely from Lactulose and decreasing in volume C. difficile PCR negative   Acute encephalopathy  Improving - stopped Lactulose but continued Rifaximin  Dysphagia/protein calorie malnutrition SLP repeat evaluation recommended dysphagia 1 diet with nectar thick liquids - tolerating thus far  Alcohol abuse Clinically any withdrawal symptoms have resolved  Acute respiratory failure in setting of acute encephalopathy resolved  Hemorrhagic shock resolved  Hyperchloremic metabolic acidosis Continue IV fluid hydration with 1/2 NS and follow trend   Hypokalemia Improving with replacement   Code Status: FULL Family Communication: Wife at bedside Disposition Plan: Transfer to telemetry floor - will need supportive environment for d/c due to cognitive deficits which may well be permanent (likely due to EtOH) - consider CIR v/s SNF   Consultants: PCCM > TRH IR  Antibiotics: Ceftriaxone 5/6 >5/13  DVT prophylaxis: SCDs  Objective: Blood pressure 133/81, pulse 69, temperature 98.8 F (37.1 C), temperature source Oral, resp. rate 18, height 6\' 1"  (1.854 m), weight 205 lb 14.6 oz (93.4 kg), SpO2 98.00%.  Intake/Output Summary (Last 24 hours) at 05/15/13 1252 Last data filed at 05/15/13 0827  Gross per 24 hour  Intake    480 ml  Output    150 ml  Net    330 ml   Exam: General: No acute respiratory distress  Lungs: Clear to auscultation bilaterally without wheezes or crackles, RA Cardiovascular: Irregular rate which is controlled without tachycardia, atrial fib rhythm  without murmur gallop or rub  Abdomen: Nontender,  nondistended, soft, bowel sounds positive, no rebound, no ascites, no appreciable mass-flexiseal with loose liquid BMs Extremities: No significant cyanosis, clubbing, or edema bilateral lower extremities  Data Reviewed: Basic Metabolic Panel:  Recent Labs Lab 05/10/13 0315 05/11/13 0330 05/12/13 0515 05/13/13 0556 05/14/13 0441  NA 139 142 144 148* 149*  K 3.4* 3.7 3.5* 3.7 3.9  CL 111 114* 117* 119* 120*  CO2 18* 16* 17* 18* 17*  GLUCOSE 120* 125* 127* 156* 128*  BUN 17 17 19 22 23   CREATININE 0.95 0.94 1.03 1.04 1.03  CALCIUM 7.8* 8.4 8.2* 8.9 9.1   Liver Function Tests:  Recent Labs Lab 05/09/13 0500 05/10/13 0315 05/12/13 0515 05/14/13 0441  AST 56* 35 33 59*  ALT 20 15 12 22   ALKPHOS 173* 161* 199* 337*  BILITOT 2.4* 3.2* 2.7* 2.3*  PROT 5.6* 5.6* 5.7* 6.3  ALBUMIN 2.0* 1.9* 1.8* 2.0*    Recent Labs Lab 05/12/13 0500 05/14/13 0430  AMMONIA 33 37   CBC:  Recent Labs Lab 05/10/13 0830 05/11/13 0330 05/12/13 0515 05/13/13 0556 05/14/13 0441  WBC 9.2 10.6* 11.4* 12.0* 13.3*  HGB 9.1* 9.3* 9.0* 9.2* 9.3*  HCT 27.7* 28.5* 28.1* 28.6* 29.4*  MCV 89.6 90.2 89.2 90.5 91.0  PLT 76* 102* 128* 133* 165   CBG:  Recent Labs Lab 05/14/13 2112 05/15/13 0010 05/15/13 0438 05/15/13 0801 05/15/13 1212  GLUCAP 138* 124* 86 81 133*    Recent Results (from the past 240 hour(s))  MRSA PCR SCREENING     Status: None   Collection Time    05/06/13  1:04 AM      Result Value Ref Range Status   MRSA by PCR NEGATIVE  NEGATIVE Final   Comment:            The GeneXpert MRSA Assay (FDA     approved for NASAL specimens     only), is one component of a     comprehensive MRSA colonization     surveillance program. It is not     intended to diagnose MRSA     infection nor to guide or     monitor treatment for     MRSA infections.  CLOSTRIDIUM DIFFICILE BY PCR     Status: None   Collection Time    05/13/13  1:18 PM      Result Value Ref Range Status   C  difficile by pcr NEGATIVE  NEGATIVE Final     Studies:  Recent x-ray studies have been reviewed in detail by the Attending Physician  Scheduled Meds:  Scheduled Meds: . antiseptic oral rinse  15 mL Mouth Rinse QID  . folic acid  1 mg Intravenous Daily  . metoprolol tartrate  25 mg Per Tube BID  . pantoprazole (PROTONIX) IV  40 mg Intravenous Q24H  . rifaximin  550 mg Per Tube BID  . thiamine  100 mg Intravenous Daily    Time spent on care of this patient: 35 mins   Samella Parr , ANP   [facility] Office  [phone] Pager - Text Page per Shea Evans as per below:  On-Call/Text Page:      [email or web address]      password [email or web address]  If 7PM-7AM, please contact night-coverage [email or web address] Password [email or web address] 05/15/2013, 12:52 PM   LOS: 9 days   I have personally examined this patient and reviewed the entire database. I have reviewed the above note, made any necessary  editorial changes, and agree with its content.  Cherene Altes, MD [facility]

## 2013-05-15 NOTE — Progress Notes (Signed)
Report called to unit 2W. Patient is currently eating lunch will transfer to 2W19 after pt is done eating.

## 2013-05-16 ENCOUNTER — Inpatient Hospital Stay (HOSPITAL_COMMUNITY): Payer: Medicare Other

## 2013-05-16 DIAGNOSIS — I4891 Unspecified atrial fibrillation: Secondary | ICD-10-CM

## 2013-05-16 DIAGNOSIS — J9600 Acute respiratory failure, unspecified whether with hypoxia or hypercapnia: Secondary | ICD-10-CM

## 2013-05-16 DIAGNOSIS — K7460 Unspecified cirrhosis of liver: Secondary | ICD-10-CM

## 2013-05-16 DIAGNOSIS — I8510 Secondary esophageal varices without bleeding: Secondary | ICD-10-CM

## 2013-05-16 DIAGNOSIS — K7030 Alcoholic cirrhosis of liver without ascites: Secondary | ICD-10-CM

## 2013-05-16 LAB — CBC
HCT: 28.4 % — ABNORMAL LOW (ref 39.0–52.0)
HEMOGLOBIN: 8.9 g/dL — AB (ref 13.0–17.0)
MCH: 29.1 pg (ref 26.0–34.0)
MCHC: 31.3 g/dL (ref 30.0–36.0)
MCV: 92.8 fL (ref 78.0–100.0)
Platelets: 155 10*3/uL (ref 150–400)
RBC: 3.06 MIL/uL — ABNORMAL LOW (ref 4.22–5.81)
RDW: 21.1 % — AB (ref 11.5–15.5)
WBC: 12.5 10*3/uL — ABNORMAL HIGH (ref 4.0–10.5)

## 2013-05-16 LAB — BASIC METABOLIC PANEL
BUN: 23 mg/dL (ref 6–23)
CALCIUM: 8.6 mg/dL (ref 8.4–10.5)
CO2: 16 mEq/L — ABNORMAL LOW (ref 19–32)
CREATININE: 1.02 mg/dL (ref 0.50–1.35)
Chloride: 117 mEq/L — ABNORMAL HIGH (ref 96–112)
GFR, EST AFRICAN AMERICAN: 80 mL/min — AB (ref >90)
GFR, EST NON AFRICAN AMERICAN: 69 mL/min — AB (ref >90)
GLUCOSE: 78 mg/dL (ref 70–99)
Potassium: 4.8 mEq/L (ref 3.7–5.3)
Sodium: 145 mEq/L (ref 137–147)

## 2013-05-16 LAB — GLUCOSE, CAPILLARY
Glucose-Capillary: 75 mg/dL (ref 70–99)
Glucose-Capillary: 104 mg/dL — ABNORMAL HIGH (ref 70–99)
Glucose-Capillary: 108 mg/dL — ABNORMAL HIGH (ref 70–99)

## 2013-05-16 MED ORDER — IPRATROPIUM-ALBUTEROL 0.5-2.5 (3) MG/3ML IN SOLN
3.0000 mL | Freq: Four times a day (QID) | RESPIRATORY_TRACT | Status: DC
  Administered 2013-05-16 – 2013-05-17 (×7): 3 mL via RESPIRATORY_TRACT
  Filled 2013-05-16 (×7): qty 3

## 2013-05-16 NOTE — Progress Notes (Signed)
Pt in/out cath'ed per orders for bladder scan of 599,  150 mL was removed.  Post cath bladder scan indicated 496, MD made aware.  Pt seems to be putting out small amounts with minimal complaints of discomfort.  Condom cath placed to monitor output, will continue to monitor.  Call light in reach, safety sitter at bedside.   [name], RN

## 2013-05-16 NOTE — Progress Notes (Signed)
[facility] PROGRESS NOTE  Joseph Hawkins AVW:[id number] DOB: Feb 14, 1934 DOA: 05/06/2013 PCP: Lottie Mussel, MD  Assessment/Plan: 1. Atrial Fibrillation with RVR -currently rate controlled -echo results reviewed -felt to be due to dehydration and so was hydrated  2. Dehydration/Hypernatremia -labs reviewed and will repeat -will back off on fluids as he appears more dyspneic  3. Esophageal Varices/GI Bleed/BRTO -appears to be stable no further bleeding  4. Acute blood loss Anemia -Monitor h/h  5. Cirrhosis/Ascites -increased abdominal girth noted -will get a ultrasound to assess ascites -?need for paracentesis  6. Acute respiratory failure -will get a CXR now -back off on fluids  Code Status: Full Code Family Communication: Wife in room (indicate person spoken with, relationship, and if by phone, the number) Disposition Plan: Home   Consultants:  Interventional Radiology  Procedures: Balloon-occluded retrograde transvenous obliteration (BRTO) of gastric varices   Antibiotics:  Rifaximin 5/15  HPI/Subjective: 79 yo with alcoholic cirrhosis and varices who presented to [facility] with hematemesis due to variceal bleed. Underwent EGD and banding of esophageal varices but had further hematemesis presumed to be due to gastric varices. He is awake today but appears to be visibly more short of breath. Also had more wheeze noted.   Objective: Filed Vitals:   05/16/13 0429  BP: 148/87  Pulse: 65  Temp: 98.6 F (37 C)  Resp: 18    Intake/Output Summary (Last 24 hours) at 05/16/13 1256 Last data filed at 05/16/13 0700  Gross per 24 hour  Intake    790 ml  Output   1400 ml  Net   -610 ml   Filed Weights   05/14/13 1100 05/15/13 0400 05/15/13 1511  Weight: 93.7 kg (206 lb 9.1 oz) 93.4 kg (205 lb 14.6 oz) 96.253 kg (212 lb 3.2 oz)    Exam:   General:  Awake oriented  Cardiovascular: IRR no gallop noted  Respiratory: wheeze bilaterally no rales  noted  Abdomen: Distended +fluid  Musculoskeletal: no synovitis no tenderness  Data Reviewed: Basic Metabolic Panel:  Recent Labs Lab 05/13/13 0556 05/14/13 0441 05/15/13 1230 05/15/13 1710 05/16/13 0608  NA 148* 149* 148* 149* 145  K 3.7 3.9 4.2 4.7 4.8  CL 119* 120* 121* 119* 117*  CO2 18* 17* 16* 17* 16*  GLUCOSE 156* 128* 130* 146* 78  BUN 22 23 25* 25* 23  CREATININE 1.04 1.03 1.05 1.06 1.02  CALCIUM 8.9 9.1 8.6 8.8 8.6   Liver Function Tests:  Recent Labs Lab 05/10/13 0315 05/12/13 0515 05/14/13 0441  AST 35 33 59*  ALT 15 12 22   ALKPHOS 161* 199* 337*  BILITOT 3.2* 2.7* 2.3*  PROT 5.6* 5.7* 6.3  ALBUMIN 1.9* 1.8* 2.0*   No results found for this basename: LIPASE, AMYLASE,  in the last 168 hours  Recent Labs Lab 05/12/13 0500 05/14/13 0430  AMMONIA 33 37   CBC:  Recent Labs Lab 05/11/13 0330 05/12/13 0515 05/13/13 0556 05/14/13 0441 05/16/13 0608  WBC 10.6* 11.4* 12.0* 13.3* 12.5*  HGB 9.3* 9.0* 9.2* 9.3* 8.9*  HCT 28.5* 28.1* 28.6* 29.4* 28.4*  MCV 90.2 89.2 90.5 91.0 92.8  PLT 102* 128* 133* 165 155   Cardiac Enzymes: No results found for this basename: CKTOTAL, CKMB, CKMBINDEX, TROPONINI,  in the last 168 hours BNP (last 3 results) No results found for this basename: PROBNP,  in the last 8760 hours CBG:  Recent Labs Lab 05/15/13 0801 05/15/13 1212 05/15/13 2056 05/16/13 0625 05/16/13 1148  GLUCAP 81 133* 142* 75  104*    Recent Results (from the past 240 hour(s))  CLOSTRIDIUM DIFFICILE BY PCR     Status: None   Collection Time    05/13/13  1:18 PM      Result Value Ref Range Status   C difficile by pcr NEGATIVE  NEGATIVE Final     Studies: No results found.  Scheduled Meds: . antiseptic oral rinse  15 mL Mouth Rinse QID  . folic acid  1 mg Oral Daily  . ipratropium-albuterol  3 mL Nebulization QID  . metoprolol tartrate  25 mg Oral BID  . pantoprazole  40 mg Oral Q1200  . rifaximin  550 mg Oral BID  . thiamine   100 mg Oral Daily   Continuous Infusions:   Active Problems:   Esophageal varices in cirrhosis   Anemia   UGIB (upper gastrointestinal bleed)   Hemorrhagic shock   Alcohol withdrawal   Acute renal failure   Alcoholic cirrhosis   Coagulopathy   Acute respiratory failure   Atrial fibrillation with RVR   Diarrhea    Time spent: 73min    Saadat A Khan  [facility] Pager [phone]. If 7PM-7AM, please contact night-coverage at [email or web address], password [facility] 05/16/2013, 12:56 PM  LOS: 10 days

## 2013-05-17 ENCOUNTER — Inpatient Hospital Stay (HOSPITAL_COMMUNITY): Payer: Medicare Other

## 2013-05-17 DIAGNOSIS — K7460 Unspecified cirrhosis of liver: Secondary | ICD-10-CM

## 2013-05-17 DIAGNOSIS — J9600 Acute respiratory failure, unspecified whether with hypoxia or hypercapnia: Secondary | ICD-10-CM

## 2013-05-17 DIAGNOSIS — K7030 Alcoholic cirrhosis of liver without ascites: Secondary | ICD-10-CM

## 2013-05-17 DIAGNOSIS — I4891 Unspecified atrial fibrillation: Secondary | ICD-10-CM

## 2013-05-17 DIAGNOSIS — I8510 Secondary esophageal varices without bleeding: Secondary | ICD-10-CM

## 2013-05-17 LAB — BASIC METABOLIC PANEL
BUN: 21 mg/dL (ref 6–23)
CHLORIDE: 114 meq/L — AB (ref 96–112)
CO2: 19 meq/L (ref 19–32)
Calcium: 8.2 mg/dL — ABNORMAL LOW (ref 8.4–10.5)
Creatinine, Ser: 1.07 mg/dL (ref 0.50–1.35)
GFR calc Af Amer: 75 mL/min — ABNORMAL LOW (ref >90)
GFR calc non Af Amer: 65 mL/min — ABNORMAL LOW (ref >90)
GLUCOSE: 85 mg/dL (ref 70–99)
Potassium: 4.4 mEq/L (ref 3.7–5.3)
Sodium: 143 mEq/L (ref 137–147)

## 2013-05-17 LAB — CBC
HEMATOCRIT: 26.2 % — AB (ref 39.0–52.0)
Hemoglobin: 8.2 g/dL — ABNORMAL LOW (ref 13.0–17.0)
MCH: 28.7 pg (ref 26.0–34.0)
MCHC: 31.3 g/dL (ref 30.0–36.0)
MCV: 91.6 fL (ref 78.0–100.0)
Platelets: 141 10*3/uL — ABNORMAL LOW (ref 150–400)
RBC: 2.86 MIL/uL — ABNORMAL LOW (ref 4.22–5.81)
RDW: 20.5 % — ABNORMAL HIGH (ref 11.5–15.5)
WBC: 9.7 10*3/uL (ref 4.0–10.5)

## 2013-05-17 LAB — AMMONIA: Ammonia: 25 umol/L (ref 11–60)

## 2013-05-17 MED ORDER — MOMETASONE FURO-FORMOTEROL FUM 100-5 MCG/ACT IN AERO
2.0000 | INHALATION_SPRAY | Freq: Two times a day (BID) | RESPIRATORY_TRACT | Status: DC
  Administered 2013-05-17 – 2013-05-20 (×7): 2 via RESPIRATORY_TRACT
  Filled 2013-05-17: qty 8.8

## 2013-05-17 MED ORDER — DIPHENHYDRAMINE HCL 12.5 MG/5ML PO ELIX
25.0000 mg | ORAL_SOLUTION | Freq: Once | ORAL | Status: AC
  Administered 2013-05-17: 25 mg via ORAL
  Filled 2013-05-17: qty 10

## 2013-05-17 MED ORDER — ALBUTEROL SULFATE (2.5 MG/3ML) 0.083% IN NEBU
2.5000 mg | INHALATION_SOLUTION | Freq: Four times a day (QID) | RESPIRATORY_TRACT | Status: DC | PRN
  Administered 2013-05-17: 2.5 mg via RESPIRATORY_TRACT
  Filled 2013-05-17: qty 3

## 2013-05-17 MED ORDER — PHENAZOPYRIDINE HCL 200 MG PO TABS
200.0000 mg | ORAL_TABLET | Freq: Once | ORAL | Status: AC
  Administered 2013-05-17: 200 mg via ORAL
  Filled 2013-05-17: qty 1

## 2013-05-17 NOTE — Progress Notes (Signed)
[facility] PROGRESS NOTE  Joseph Hawkins DTO:[id number] DOB: July 15, 1934 DOA: 05/06/2013 PCP: Lottie Mussel, MD  Assessment/Plan: 1. Atrial Fibrillation with RVR -currently rate controlled on present management  2. Dehydration/Hypernatremia -labs reviewed improving -repeat labs in am  3. Esophageal Varices/GI Bleed/BRTO -appears to be stable no further bleeding -Hgb appears stable at this time  4. Acute blood loss Anemia -Monitor h/h  5. Cirrhosis/Ascites -increased abdominal girth noted -ultrasound shows ascites present but was not quantified - May benefit from paracentesis -would discuss with IR in the morning -will also get a ammonia level today  6. Acute respiratory failure -fluids decreased -CXR looks good -continue with inhalers and nebs -added dulera to his regimen  Code Status: Full Code Family Communication: Wife in room (indicate person spoken with, relationship, and if by phone, the number) Disposition Plan: Home   Consultants:  Interventional Radiology  Procedures: Balloon-occluded retrograde transvenous obliteration (BRTO) of gastric varices   Antibiotics:  Rifaximin 5/15  HPI/Subjective: 79 yo with alcoholic cirrhosis and varices who presented to [facility] with hematemesis due to variceal bleed. Underwent EGD and banding of esophageal varices but had further hematemesis presumed to be due to gastric varices. Seems a little more improved from his respiratory status. Add dulera today   Objective: Filed Vitals:   05/17/13 0502  BP: 127/68  Pulse: 64  Temp: 97.3 F (36.3 C)  Resp: 20    Intake/Output Summary (Last 24 hours) at 05/17/13 1003 Last data filed at 05/17/13 0500  Gross per 24 hour  Intake    360 ml  Output   1296 ml  Net   -936 ml   Filed Weights   05/15/13 1511 05/16/13 0500 05/17/13 0502  Weight: 96.253 kg (212 lb 3.2 oz) 94.7 kg (208 lb 12.4 oz) 95.6 kg (210 lb 12.2 oz)    Exam:   General:  Awake  oriented  Cardiovascular: IRR no gallop noted  Respiratory: wheeze bilaterally no rales noted  Abdomen: Distended +fluid  Musculoskeletal: no synovitis no tenderness  Data Reviewed: Basic Metabolic Panel:  Recent Labs Lab 05/14/13 0441 05/15/13 1230 05/15/13 1710 05/16/13 0608 05/17/13 0450  NA 149* 148* 149* 145 143  K 3.9 4.2 4.7 4.8 4.4  CL 120* 121* 119* 117* 114*  CO2 17* 16* 17* 16* 19  GLUCOSE 128* 130* 146* 78 85  BUN 23 25* 25* 23 21  CREATININE 1.03 1.05 1.06 1.02 1.07  CALCIUM 9.1 8.6 8.8 8.6 8.2*   Liver Function Tests:  Recent Labs Lab 05/12/13 0515 05/14/13 0441  AST 33 59*  ALT 12 22  ALKPHOS 199* 337*  BILITOT 2.7* 2.3*  PROT 5.7* 6.3  ALBUMIN 1.8* 2.0*   No results found for this basename: LIPASE, AMYLASE,  in the last 168 hours  Recent Labs Lab 05/12/13 0500 05/14/13 0430  AMMONIA 33 37   CBC:  Recent Labs Lab 05/12/13 0515 05/13/13 0556 05/14/13 0441 05/16/13 0608 05/17/13 0450  WBC 11.4* 12.0* 13.3* 12.5* 9.7  HGB 9.0* 9.2* 9.3* 8.9* 8.2*  HCT 28.1* 28.6* 29.4* 28.4* 26.2*  MCV 89.2 90.5 91.0 92.8 91.6  PLT 128* 133* 165 155 141*   Cardiac Enzymes: No results found for this basename: CKTOTAL, CKMB, CKMBINDEX, TROPONINI,  in the last 168 hours BNP (last 3 results) No results found for this basename: PROBNP,  in the last 8760 hours CBG:  Recent Labs Lab 05/15/13 1212 05/15/13 2056 05/16/13 0625 05/16/13 1148 05/16/13 1624  GLUCAP 133* 142* 75 104* 108*  Recent Results (from the past 240 hour(s))  CLOSTRIDIUM DIFFICILE BY PCR     Status: None   Collection Time    05/13/13  1:18 PM      Result Value Ref Range Status   C difficile by pcr NEGATIVE  NEGATIVE Final     Studies: US Abdomen Complete  05/17/2013   CLINICAL DATA:  Abdominal distension.  History of cirrhosis.  EXAM: ULTRASOUND ABDOMEN COMPLETE  COMPARISON:  DG ABD PORTABLE 1V dated 05/09/2013; CT ABD WO/W CM dated 05/09/2013  FINDINGS: Gallbladder:   Dependent sludge suspect sludge and stones are noted within the gallbladder, stones not individually measurable. No gallbladder wall thickening. No sonographic Murphy sign. Ascites is present.  Common bile duct:  Diameter: 4 mm  Liver:  Nodular contour with increased echogenicity compatible with cirrhosis.  IVC:  No abnormality visualized.  Pancreas:  Visualized portion unremarkable.  Spleen:  Size and appearance within normal limits.  Right Kidney:  Length: 10.4 cm. Echogenicity within normal limits. No mass or hydronephrosis visualized.  Left Kidney:  Length: 10.3 cm. Echogenicity within normal limits. No mass or hydronephrosis visualized.  Abdominal aorta:  Proximal aorta normal in caliber. Mid and distal aorta obscured by bowel gas.  Other findings:  Ascites  IMPRESSION: Nodular hepatic contour compatible with cirrhosis with ascites present.   Electronically Signed   By: Conchita Paris M.D.   On: 05/17/2013 09:43   Dg Chest Port 1 View  05/16/2013   CLINICAL DATA:  Shortness of breath  EXAM: PORTABLE CHEST - 1 VIEW  COMPARISON:  DG CHEST 1V PORT dated 05/09/2013  FINDINGS: Retained oral contrast noted over the abdomen, obscured by soft tissue artifact. Left subclavian approach central line remains in place with tip at the brachiocephalic/ SVC junction. Moderate enlargement of the cardiac silhouette is reidentified without evidence for edema. Lung volumes are extremely low with crowding of the bronchovascular markings and suboptimal visualization at the lung bases. No pleural effusion or focal pulmonary opacity.  IMPRESSION: Allowing for extreme hypoaeration, no focal acute findings. If symptoms persist, consider PA and lateral chest radiographs obtained at full inspiration when the patient is clinically able.  Cardiomegaly without evidence for edema.   Electronically Signed   By: Conchita Paris M.D.   On: 05/16/2013 17:39    Scheduled Meds: . antiseptic oral rinse  15 mL Mouth Rinse QID  . folic acid  1  mg Oral Daily  . ipratropium-albuterol  3 mL Nebulization QID  . metoprolol tartrate  25 mg Oral BID  . pantoprazole  40 mg Oral Q1200  . rifaximin  550 mg Oral BID  . thiamine  100 mg Oral Daily   Continuous Infusions:   Active Problems:   Esophageal varices in cirrhosis   Anemia   UGIB (upper gastrointestinal bleed)   Hemorrhagic shock   Alcohol withdrawal   Acute renal failure   Alcoholic cirrhosis   Coagulopathy   Acute respiratory failure   Atrial fibrillation with RVR   Diarrhea    Time spent: 79min    Zissy Hamlett A Kaidyn Hernandes  [facility] Pager [phone]. If 7PM-7AM, please contact night-coverage at [email or web address], password [facility] 05/17/2013, 10:03 AM  LOS: 11 days

## 2013-05-17 NOTE — Progress Notes (Signed)
Pt output in past 8 hours 252mL, bladder scanner indicated 633 mL.  MD notified, added orders to place foley catheter.  Foley placed per orders.  Pt tolerated well with 200 ml in drainage bag after insertion.  Sitter at bedside, call light in reach, RN will continue to monitor.   [name], RN

## 2013-05-18 ENCOUNTER — Inpatient Hospital Stay (HOSPITAL_COMMUNITY): Payer: Medicare Other

## 2013-05-18 LAB — CBC
HCT: 29.7 % — ABNORMAL LOW (ref 39.0–52.0)
HEMOGLOBIN: 9.5 g/dL — AB (ref 13.0–17.0)
MCH: 29.1 pg (ref 26.0–34.0)
MCHC: 32 g/dL (ref 30.0–36.0)
MCV: 91.1 fL (ref 78.0–100.0)
Platelets: 133 10*3/uL — ABNORMAL LOW (ref 150–400)
RBC: 3.26 MIL/uL — ABNORMAL LOW (ref 4.22–5.81)
RDW: 20.3 % — ABNORMAL HIGH (ref 11.5–15.5)
WBC: 12.8 10*3/uL — ABNORMAL HIGH (ref 4.0–10.5)

## 2013-05-18 LAB — BASIC METABOLIC PANEL
BUN: 22 mg/dL (ref 6–23)
CALCIUM: 8.5 mg/dL (ref 8.4–10.5)
CO2: 18 mEq/L — ABNORMAL LOW (ref 19–32)
Chloride: 109 mEq/L (ref 96–112)
Creatinine, Ser: 1.03 mg/dL (ref 0.50–1.35)
GFR calc Af Amer: 79 mL/min — ABNORMAL LOW (ref >90)
GFR, EST NON AFRICAN AMERICAN: 68 mL/min — AB (ref >90)
Glucose, Bld: 92 mg/dL (ref 70–99)
Potassium: 4.2 mEq/L (ref 3.7–5.3)
Sodium: 138 mEq/L (ref 137–147)

## 2013-05-18 LAB — GLUCOSE, SEROUS FLUID: Glucose, Fluid: 113 mg/dL

## 2013-05-18 LAB — LACTATE DEHYDROGENASE, PLEURAL OR PERITONEAL FLUID: LD, Fluid: 52 U/L — ABNORMAL HIGH (ref 3–23)

## 2013-05-18 LAB — AMYLASE, PERITONEAL FLUID: Amylase, peritoneal fluid: 21 U/L

## 2013-05-18 LAB — PROTEIN, BODY FLUID: Total protein, fluid: 0.9 g/dL

## 2013-05-18 MED ORDER — TAMSULOSIN HCL 0.4 MG PO CAPS
0.4000 mg | ORAL_CAPSULE | Freq: Every day | ORAL | Status: DC
  Administered 2013-05-18 – 2013-05-20 (×3): 0.4 mg via ORAL
  Filled 2013-05-18 (×3): qty 1

## 2013-05-18 MED ORDER — SPIRONOLACTONE 100 MG PO TABS
100.0000 mg | ORAL_TABLET | Freq: Every day | ORAL | Status: DC
  Administered 2013-05-18 – 2013-05-20 (×3): 100 mg via ORAL
  Filled 2013-05-18 (×3): qty 1

## 2013-05-18 MED ORDER — IPRATROPIUM-ALBUTEROL 0.5-2.5 (3) MG/3ML IN SOLN
3.0000 mL | Freq: Three times a day (TID) | RESPIRATORY_TRACT | Status: DC
  Administered 2013-05-18 – 2013-05-19 (×4): 3 mL via RESPIRATORY_TRACT
  Filled 2013-05-18 (×5): qty 3

## 2013-05-18 MED ORDER — FUROSEMIDE 40 MG PO TABS
40.0000 mg | ORAL_TABLET | Freq: Every day | ORAL | Status: DC
  Administered 2013-05-18 – 2013-05-20 (×3): 40 mg via ORAL
  Filled 2013-05-18 (×3): qty 1

## 2013-05-18 MED ORDER — ENSURE PUDDING PO PUDG
1.0000 | Freq: Three times a day (TID) | ORAL | Status: DC
  Administered 2013-05-18 – 2013-05-19 (×5): 1 via ORAL

## 2013-05-18 NOTE — Progress Notes (Signed)
PT Cancellation Note  Patient Details Name: Joseph Hawkins MRN: [id number] DOB: [birth year]   Cancelled Treatment:    Reason Eval/Treat Not Completed: Patient at procedure or test/unavailable   Shary Decamp [facility] 05/18/2013, 10:11 AM

## 2013-05-18 NOTE — Progress Notes (Signed)
SLP Cancellation Note  Patient Details Name: Durward Matranga MRN: [id number] DOB: [birth year]   Cancelled treatment:       Reason Eval/Treat Not Completed: Patient at procedure or test/unavailable  Gabriel [place], CCC-SLP [phone]  Ramata Strothman Meryl Anitra Doxtater 05/18/2013, 10:51 AM

## 2013-05-18 NOTE — Progress Notes (Signed)
NUTRITION FOLLOW UP  INTERVENTION: Ensure Pudding po TID, each supplement provides 170 kcal and 4 grams of protein RD to follow for nutrition care plan  NUTRITION DIAGNOSIS: Inadequate oral intake now related to limited appetite as evidenced by PO intake 50%, ongoing  Goal: Pt to meet >/= 90% of their estimated nutrition needs, progressing  Monitor:  PO & supplemental intake, weight, labs, I/O's  ASSESSMENT: 79 yo with alcoholic cirrhosis and varices presented to [facility] with hematemesis due to variceal bleed. Underwent EGD and banding of esophageal varices but had further hematemesis presumed to be due to gastric varices. Transferred to [facility] in consideration of IR procedure. CT abd on 5/6 showed no significant esophageal varices. S/P BRTO on 5/7.  Patient required intubation on 5/6.  Extubated on 5/8.   Patient transferred to 2W-Cardiac from 2C-Stepdown 5/15.  Patient s/p MBSS 5/14.  Patient diagnosed with moderate oral phase dysphagia. NGT removed and TF (Vital AF 1.2 formula) discontinued.  PO intake 50% per flowsheet records.  Would benefit from addition of oral nutrition supplement given alcoholic cirrhosis.  RD to order.  Patient s/p procedure 5/18: PARACENTESIS  Height: Ht Readings from Last 1 Encounters:  05/17/13 6\' 1"  (1.854 m)    Weight: Wt Readings from Last 1 Encounters:  05/18/13 210 lb 8.6 oz (95.5 kg)  05/06/13 190 lb (ADMISSION WEIGHT)  BMI:  Body mass index is 27.78 kg/(m^2).  Estimated Nutritional Needs: Kcal: 1900-2100 Protein: 100-115 gm Fluid: 1.9-2.1 L  Skin: Intact  Diet Order: Dysphagia 1, nectar thick liquids   Intake/Output Summary (Last 24 hours) at 05/18/13 1203 Last data filed at 05/18/13 0900  Gross per 24 hour  Intake    710 ml  Output    425 ml  Net    285 ml    Labs:   Recent Labs Lab 05/16/13 0608 05/17/13 0450 05/18/13 0500  NA 145 143 138  K 4.8 4.4 4.2  CL 117* 114* 109  CO2 16* 19 18*  BUN 23 21 22   CREATININE  1.02 1.07 1.03  CALCIUM 8.6 8.2* 8.5  GLUCOSE 78 85 92    CBG (last 3)   Recent Labs  05/16/13 0625 05/16/13 1148 05/16/13 1624  GLUCAP 75 104* 108*    Scheduled Meds: . antiseptic oral rinse  15 mL Mouth Rinse QID  . folic acid  1 mg Oral Daily  . furosemide  40 mg Oral Daily  . ipratropium-albuterol  3 mL Nebulization TID  . metoprolol tartrate  25 mg Oral BID  . mometasone-formoterol  2 puff Inhalation BID  . pantoprazole  40 mg Oral Q1200  . rifaximin  550 mg Oral BID  . spironolactone  100 mg Oral Daily  . tamsulosin  0.4 mg Oral Daily  . thiamine  100 mg Oral Daily    Continuous Infusions:    Past Surgical History  Procedure Laterality Date  . Radiology with anesthesia N/A 05/07/2013    Procedure: RADIOLOGY WITH ANESTHESIA;  Surgeon: Jacqulynn Cadet, MD;  Location: [place];  Service: Radiology;  Laterality: N/A;    Arthur Holms, RD, LDN Pager #: [phone] After-Hours Pager #: [phone]

## 2013-05-18 NOTE — Procedures (Signed)
   US guided LLQ para  2.2 liters yellow fluid obtained Fluid was sent for labs per MD  Pt tolerated well.

## 2013-05-18 NOTE — Progress Notes (Signed)
Physical Therapy Treatment Patient Details Name: Joseph Hawkins MRN: [id number] DOB: 10/23/1934 Today's Date: May 21, 2013    History of Present Illness Patient is a 79 y/o male with h/o ETOH cirrhosis and esophogeal varices s/p banding, admitted with hematemesis with esophogeal bleeding,  underwent BRTO procedure 05/07/13. intubated 5/6-5/8.    PT Comments    Pt limited today  By leaking flexiseal.  Follow Up Recommendations  SNF     Equipment Recommendations  Rolling walker with 5" wheels    Recommendations for Other Services       Precautions / Restrictions Precautions Precautions: Fall Precaution Comments: rectal tube, condom cath    Mobility  Bed Mobility Overal bed mobility: Needs Assistance Bed Mobility: Supine to Sit;Sit to Supine     Supine to sit: Min guard Sit to supine: Min guard   General bed mobility comments: Use of rails and incr time  Transfers Overall transfer level: Needs assistance Equipment used: Rolling walker (2 wheeled) Transfers: Sit to/from Stand Sit to Stand: Min assist;+2 safety/equipment         General transfer comment: verbal cues for hand placement  Ambulation/Gait                 Stairs            Wheelchair Mobility    Modified Rankin (Stroke Patients Only)       Balance Overall balance assessment: Needs assistance Sitting-balance support: No upper extremity supported;Feet supported Sitting balance-Leahy Scale: Good     Standing balance support: Bilateral upper extremity supported Standing balance-Leahy Scale: Poor                      Cognition Arousal/Alertness: Awake/alert Behavior During Therapy: WFL for tasks assessed/performed Overall Cognitive Status: Within Functional Limits for tasks assessed                      Exercises      General Comments        Pertinent Vitals/Pain NAD    Home Living                      Prior Function            PT Goals  (current goals can now be found in the care plan section) Progress towards PT goals: Not progressing toward goals - comment (Leaking flexiseal)    Frequency  Min 2X/week    PT Plan Discharge plan needs to be updated;Frequency needs to be updated    Co-evaluation             End of Session Equipment Utilized During Treatment: Gait belt Activity Tolerance: Other (comment) (Limited by leaking flexiseal) Patient left: in bed;with call bell/phone within reach;with bed alarm set;with family/visitor present     Time: 0086-7619 PT Time Calculation (min): 17 min  Charges:  $Gait Training: 8-22 mins                    G CodesShary Decamp Blaze Nylund 2013-05-21, 3:59 PM  [employer] PT [phone]

## 2013-05-18 NOTE — Progress Notes (Signed)
[facility] PROGRESS NOTE  Arad Burston IOE:[id number] DOB: October 22, 1934 DOA: 05/06/2013 PCP: Lottie Mussel, MD  Assessment/Plan: Atrial Fibrillation with RVR -currently rate controlled on present management  Esophageal Varices/GI Bleed/BRTO -appears to be stable no further bleeding -Hgb appears stable at this time  Acute blood loss Anemia due to above -Monitor h/h  Cirrhosis/Ascites -increased abdominal girth noted -ultrasound shows ascites present but was not quantified - May benefit from paracentesis -ammonia ok -start lasix/aldactone  Acute respiratory failure -d/c IVf -CXR looks good -continue with inhalers and nebs -added dulera to his regimen   Code Status: Full Code Family Communication: no family at bedside Disposition Plan: SNF?   Consultants:  Interventional Radiology  Procedures: Balloon-occluded retrograde transvenous obliteration (BRTO) of gastric varices      HPI/Subjective: 79 yo with alcoholic cirrhosis and varices who presented to [facility] with hematemesis due to variceal bleed. Underwent EGD and banding of esophageal varices but had further hematemesis presumed to be due to gastric varices.   Trying to get out of bed C/o difficulty breathing   Objective: Filed Vitals:   05/18/13 0513  BP: 123/76  Pulse: 86  Temp: 98 F (36.7 C)  Resp: 24    Intake/Output Summary (Last 24 hours) at 05/18/13 0907 Last data filed at 05/18/13 0546  Gross per 24 hour  Intake    590 ml  Output    425 ml  Net    165 ml   Filed Weights   05/16/13 0500 05/17/13 0502 05/18/13 0547  Weight: 94.7 kg (208 lb 12.4 oz) 95.6 kg (210 lb 12.2 oz) 95.5 kg (210 lb 8.6 oz)    Exam:   General:  Awake oriented to person/place/time  Cardiovascular: IRR no gallop noted  Respiratory: wheeze bilaterally no rales noted  Abdomen: Distended +fluid,   Musculoskeletal: no synovitis no tenderness  Data Reviewed: Basic Metabolic Panel:  Recent Labs Lab  05/15/13 1230 05/15/13 1710 05/16/13 0608 05/17/13 0450 05/18/13 0500  NA 148* 149* 145 143 138  K 4.2 4.7 4.8 4.4 4.2  CL 121* 119* 117* 114* 109  CO2 16* 17* 16* 19 18*  GLUCOSE 130* 146* 78 85 92  BUN 25* 25* 23 21 22   CREATININE 1.05 1.06 1.02 1.07 1.03  CALCIUM 8.6 8.8 8.6 8.2* 8.5   Liver Function Tests:  Recent Labs Lab 05/12/13 0515 05/14/13 0441  AST 33 59*  ALT 12 22  ALKPHOS 199* 337*  BILITOT 2.7* 2.3*  PROT 5.7* 6.3  ALBUMIN 1.8* 2.0*   No results found for this basename: LIPASE, AMYLASE,  in the last 168 hours  Recent Labs Lab 05/12/13 0500 05/14/13 0430 05/17/13 1343  AMMONIA 33 37 25   CBC:  Recent Labs Lab 05/13/13 0556 05/14/13 0441 05/16/13 0608 05/17/13 0450 05/18/13 0500  WBC 12.0* 13.3* 12.5* 9.7 12.8*  HGB 9.2* 9.3* 8.9* 8.2* 9.5*  HCT 28.6* 29.4* 28.4* 26.2* 29.7*  MCV 90.5 91.0 92.8 91.6 91.1  PLT 133* 165 155 141* 133*   Cardiac Enzymes: No results found for this basename: CKTOTAL, CKMB, CKMBINDEX, TROPONINI,  in the last 168 hours BNP (last 3 results) No results found for this basename: PROBNP,  in the last 8760 hours CBG:  Recent Labs Lab 05/15/13 1212 05/15/13 2056 05/16/13 0625 05/16/13 1148 05/16/13 1624  GLUCAP 133* 142* 75 104* 108*    Recent Results (from the past 240 hour(s))  CLOSTRIDIUM DIFFICILE BY PCR     Status: None   Collection Time  05/13/13  1:18 PM      Result Value Ref Range Status   C difficile by pcr NEGATIVE  NEGATIVE Final     Studies: US Abdomen Complete  05/17/2013   CLINICAL DATA:  Abdominal distension.  History of cirrhosis.  EXAM: ULTRASOUND ABDOMEN COMPLETE  COMPARISON:  DG ABD PORTABLE 1V dated 05/09/2013; CT ABD WO/W CM dated 05/09/2013  FINDINGS: Gallbladder:  Dependent sludge suspect sludge and stones are noted within the gallbladder, stones not individually measurable. No gallbladder wall thickening. No sonographic Murphy sign. Ascites is present.  Common bile duct:  Diameter: 4 mm   Liver:  Nodular contour with increased echogenicity compatible with cirrhosis.  IVC:  No abnormality visualized.  Pancreas:  Visualized portion unremarkable.  Spleen:  Size and appearance within normal limits.  Right Kidney:  Length: 10.4 cm. Echogenicity within normal limits. No mass or hydronephrosis visualized.  Left Kidney:  Length: 10.3 cm. Echogenicity within normal limits. No mass or hydronephrosis visualized.  Abdominal aorta:  Proximal aorta normal in caliber. Mid and distal aorta obscured by bowel gas.  Other findings:  Ascites  IMPRESSION: Nodular hepatic contour compatible with cirrhosis with ascites present.   Electronically Signed   By: Conchita Paris M.D.   On: 05/17/2013 09:43   Dg Chest Port 1 View  05/16/2013   CLINICAL DATA:  Shortness of breath  EXAM: PORTABLE CHEST - 1 VIEW  COMPARISON:  DG CHEST 1V PORT dated 05/09/2013  FINDINGS: Retained oral contrast noted over the abdomen, obscured by soft tissue artifact. Left subclavian approach central line remains in place with tip at the brachiocephalic/ SVC junction. Moderate enlargement of the cardiac silhouette is reidentified without evidence for edema. Lung volumes are extremely low with crowding of the bronchovascular markings and suboptimal visualization at the lung bases. No pleural effusion or focal pulmonary opacity.  IMPRESSION: Allowing for extreme hypoaeration, no focal acute findings. If symptoms persist, consider PA and lateral chest radiographs obtained at full inspiration when the patient is clinically able.  Cardiomegaly without evidence for edema.   Electronically Signed   By: Conchita Paris M.D.   On: 05/16/2013 17:39    Scheduled Meds: . antiseptic oral rinse  15 mL Mouth Rinse QID  . folic acid  1 mg Oral Daily  . furosemide  40 mg Oral Daily  . ipratropium-albuterol  3 mL Nebulization TID  . metoprolol tartrate  25 mg Oral BID  . mometasone-formoterol  2 puff Inhalation BID  . pantoprazole  40 mg Oral Q1200  .  rifaximin  550 mg Oral BID  . spironolactone  100 mg Oral Daily  . tamsulosin  0.4 mg Oral Daily  . thiamine  100 mg Oral Daily   Continuous Infusions:   Active Problems:   Esophageal varices in cirrhosis   Anemia   UGIB (upper gastrointestinal bleed)   Hemorrhagic shock   Alcohol withdrawal   Acute renal failure   Alcoholic cirrhosis   Coagulopathy   Acute respiratory failure   Atrial fibrillation with RVR   Diarrhea    Time spent: 63min    Carleen Rhue U Latrise Bowland  [facility] Pager [phone] If 7PM-7AM, please contact night-coverage at [email or web address], password [facility] 05/18/2013, 9:07 AM  LOS: 12 days

## 2013-05-19 DIAGNOSIS — R338 Other retention of urine: Secondary | ICD-10-CM

## 2013-05-19 LAB — CBC
HEMATOCRIT: 32.6 % — AB (ref 39.0–52.0)
Hemoglobin: 10.2 g/dL — ABNORMAL LOW (ref 13.0–17.0)
MCH: 28.7 pg (ref 26.0–34.0)
MCHC: 31.3 g/dL (ref 30.0–36.0)
MCV: 91.8 fL (ref 78.0–100.0)
Platelets: 158 10*3/uL (ref 150–400)
RBC: 3.55 MIL/uL — ABNORMAL LOW (ref 4.22–5.81)
RDW: 20.4 % — ABNORMAL HIGH (ref 11.5–15.5)
WBC: 10 10*3/uL (ref 4.0–10.5)

## 2013-05-19 LAB — BASIC METABOLIC PANEL
BUN: 23 mg/dL (ref 6–23)
CALCIUM: 8.4 mg/dL (ref 8.4–10.5)
CHLORIDE: 109 meq/L (ref 96–112)
CO2: 20 meq/L (ref 19–32)
CREATININE: 1.17 mg/dL (ref 0.50–1.35)
GFR calc Af Amer: 68 mL/min — ABNORMAL LOW (ref >90)
GFR calc non Af Amer: 58 mL/min — ABNORMAL LOW (ref >90)
GLUCOSE: 134 mg/dL — AB (ref 70–99)
Potassium: 4.2 mEq/L (ref 3.7–5.3)
Sodium: 139 mEq/L (ref 137–147)

## 2013-05-19 MED ORDER — IPRATROPIUM-ALBUTEROL 0.5-2.5 (3) MG/3ML IN SOLN: 3.0000 mL | Freq: Four times a day (QID) | RESPIRATORY_TRACT | Status: DC | PRN

## 2013-05-19 NOTE — Progress Notes (Signed)
CSW spoke to patient's wife over the phone for an update that at this time there are no bed offers. CSW explained that social worker will follow up with patient's wife tomorrow morning.  Jeanette Caprice, MSW, [place]

## 2013-05-19 NOTE — Progress Notes (Signed)
Clinical Social Work Department BRIEF PSYCHOSOCIAL ASSESSMENT 05/19/2013  Patient:  Joseph Hawkins, Joseph Hawkins     Account Number:  [id number]     Admit date:  05/06/2013  Clinical Social Worker:  Megan Salon  Date/Time:  05/19/2013 12:28 PM  Referred by:  Physician  Date Referred:  05/18/2013 Referred for  SNF Placement   Other Referral:   Interview type:  Other - See comment Other interview type:   CSW spoke to patient and patient's wife by bedside    PSYCHOSOCIAL DATA Living Status:  WIFE Admitted from facility:   Level of care:   Primary support name:  Erik Burkett Primary support relationship to patient:  SPOUSE Degree of support available:   Good    CURRENT CONCERNS Current Concerns  Post-Acute Placement   Other Concerns:    SOCIAL WORK ASSESSMENT / PLAN Clinical Social Worker received referral for SNF placement at d/c. CSW introduced self and explained reason for visit. Patient had visitor by bedside, patient's wife.  CSW explained SNF process to patient and family.  Patient's wife reported they are agreeable for SNF placement.  CSW will complete FL2 for MD's signature and will update patient and family when bed offers are received.   Assessment/plan status:  Psychosocial Support/Ongoing Assessment of Needs Other assessment/ plan:   Information/referral to community resources:   SNF information    PATIENT'S/FAMILY'S RESPONSE TO PLAN OF CARE: Patient's wife reports that their preferences are [place], either [facility], [place] or [facility], MSW, [place]

## 2013-05-19 NOTE — Progress Notes (Signed)
[facility] PROGRESS NOTE  Firas Guardado RAQ:[id number] DOB: December 21, 1934 DOA: 05/06/2013 PCP: Lottie Mussel, MD  Assessment/Plan: Atrial Fibrillation with RVR -currently rate controlled on present management  Esophageal Varices/GI Bleed/BRTO -appears to be stable no further bleeding -Hgb appears stable at this time  Acute blood loss Anemia due to above -Monitor h/h  Cirrhosis/Ascites -increased abdominal girth noted -ultrasound shows ascites  - s/p paracentesis 2.2 L -ammonia ok -start lasix/aldactone- monitor labs  Acute respiratory failure -d/c IVf -CXR looks good -continue with inhalers and nebs -added dulera to his regimen  Acute urinary retention -added flomax -voiding trial in AM or at SNF   Code Status: Full Code Family Communication: wife yesterday afternoon Disposition Plan: SNF 1- 2days   Consultants:  Interventional Radiology  Procedures: Balloon-occluded retrograde transvenous obliteration (BRTO) of gastric varices      HPI/Subjective: 79 yo with alcoholic cirrhosis and varices who presented to [facility] with hematemesis due to variceal bleed. Underwent EGD and banding of esophageal varices but had further hematemesis presumed to be due to gastric varices.   Feeling much better after paracentesis   Objective: Filed Vitals:   05/19/13 0408  BP: 117/59  Pulse: 88  Temp: 98.2 F (36.8 C)  Resp: 18    Intake/Output Summary (Last 24 hours) at 05/19/13 1008 Last data filed at 05/19/13 0800  Gross per 24 hour  Intake    120 ml  Output   2100 ml  Net  -1980 ml   Filed Weights   05/17/13 0502 05/18/13 0547 05/19/13 0408  Weight: 95.6 kg (210 lb 12.2 oz) 95.5 kg (210 lb 8.6 oz) 93.2 kg (205 lb 7.5 oz)    Exam:   General:  Awake oriented to person/place/time  Cardiovascular: IRR no gallop noted  Respiratory: wheeze bilaterally no rales noted  Abdomen: Distended +fluid,   Musculoskeletal: no synovitis no tenderness  Data  Reviewed: Basic Metabolic Panel:  Recent Labs Lab 05/15/13 1710 05/16/13 0608 05/17/13 0450 05/18/13 0500 05/19/13 0900  NA 149* 145 143 138 139  K 4.7 4.8 4.4 4.2 4.2  CL 119* 117* 114* 109 109  CO2 17* 16* 19 18* 20  GLUCOSE 146* 78 85 92 134*  BUN 25* 23 21 22 23   CREATININE 1.06 1.02 1.07 1.03 1.17  CALCIUM 8.8 8.6 8.2* 8.5 8.4   Liver Function Tests:  Recent Labs Lab 05/14/13 0441  AST 59*  ALT 22  ALKPHOS 337*  BILITOT 2.3*  PROT 6.3  ALBUMIN 2.0*   No results found for this basename: LIPASE, AMYLASE,  in the last 168 hours  Recent Labs Lab 05/14/13 0430 05/17/13 1343  AMMONIA 37 25   CBC:  Recent Labs Lab 05/14/13 0441 05/16/13 0608 05/17/13 0450 05/18/13 0500 05/19/13 0900  WBC 13.3* 12.5* 9.7 12.8* 10.0  HGB 9.3* 8.9* 8.2* 9.5* 10.2*  HCT 29.4* 28.4* 26.2* 29.7* 32.6*  MCV 91.0 92.8 91.6 91.1 91.8  PLT 165 155 141* 133* 158   Cardiac Enzymes: No results found for this basename: CKTOTAL, CKMB, CKMBINDEX, TROPONINI,  in the last 168 hours BNP (last 3 results) No results found for this basename: PROBNP,  in the last 8760 hours CBG:  Recent Labs Lab 05/15/13 1212 05/15/13 2056 05/16/13 0625 05/16/13 1148 05/16/13 1624  GLUCAP 133* 142* 75 104* 108*    Recent Results (from the past 240 hour(s))  CLOSTRIDIUM DIFFICILE BY PCR     Status: None   Collection Time    05/13/13  1:18 PM  Result Value Ref Range Status   C difficile by pcr NEGATIVE  NEGATIVE Final  BODY FLUID CULTURE     Status: None   Collection Time    05/18/13 10:42 AM      Result Value Ref Range Status   Specimen Description FLUID ABDOMEN ASCITIC   Final   Special Requests NONE   Final   Gram Stain     Final   Value: NO WBC SEEN     NO ORGANISMS SEEN     Performed at [facility]   Culture PENDING   Incomplete   Report Status PENDING   Incomplete     Studies: US Paracentesis  05/18/2013   CLINICAL DATA:  Abdominal ascites  EXAM: ULTRASOUND GUIDED  LEFT LOWER QUADRANT PARACENTESIS  COMPARISON:  None.  PROCEDURE: An ultrasound guided paracentesis was thoroughly discussed with the patient and questions answered. The benefits, risks, alternatives and complications were also discussed. The patient understands and wishes to proceed with the procedure. Written consent was obtained.  Ultrasound was performed to localize and mark an adequate pocket of fluid in the left lower quadrant of the abdomen. The area was then prepped and draped in the normal sterile fashion. 1% Lidocaine was used for local anesthesia. Under ultrasound guidance a 19 gauge Yueh catheter was introduced. Paracentesis was performed. The catheter was removed and a dressing applied.  Complications: None.  FINDINGS: A total of approximately 2.2 L of yellow fluid was removed. A fluid sample was sent for laboratory analysis.  IMPRESSION: Successful ultrasound guided paracentesis yielding 2.2 L of ascites.  Read by: Jannifer Franklin [facility]   Electronically Signed   By: Arne Cleveland M.D.   On: 05/18/2013 11:08    Scheduled Meds: . antiseptic oral rinse  15 mL Mouth Rinse QID  . feeding supplement (ENSURE)  1 Container Oral TID BM  . folic acid  1 mg Oral Daily  . furosemide  40 mg Oral Daily  . metoprolol tartrate  25 mg Oral BID  . mometasone-formoterol  2 puff Inhalation BID  . pantoprazole  40 mg Oral Q1200  . rifaximin  550 mg Oral BID  . spironolactone  100 mg Oral Daily  . tamsulosin  0.4 mg Oral Daily  . thiamine  100 mg Oral Daily   Continuous Infusions:   Active Problems:   Esophageal varices in cirrhosis   Anemia   UGIB (upper gastrointestinal bleed)   Hemorrhagic shock   Alcohol withdrawal   Acute renal failure   Alcoholic cirrhosis   Coagulopathy   Acute respiratory failure   Atrial fibrillation with RVR   Diarrhea    Time spent: 60min    Rilee Wendling U Ralyn Stlaurent  [facility] Pager [phone] If 7PM-7AM, please contact night-coverage at [email or web address], password  [facility] 05/19/2013, 10:08 AM  LOS: 13 days

## 2013-05-19 NOTE — Progress Notes (Addendum)
Clinical Social Work Department CLINICAL SOCIAL WORK PLACEMENT NOTE 05/19/2013  Patient:  Joseph Hawkins, Joseph Hawkins  Account Number:  [id number] Admit date:  05/06/2013  Clinical Social Worker:  Megan Salon  Date/time:  05/19/2013 12:35 PM  Clinical Social Work is seeking post-discharge placement for this patient at the following level of care:   [place]   (*CSW will update this form in Epic as items are completed)   05/19/2013  Patient/family provided with [place] Department of Clinical Social Work's list of facilities offering this level of care within the geographic area requested by the patient (or if unable, by the patient's family).  05/19/2013  Patient/family informed of their freedom to choose among providers that offer the needed level of care, that participate in Medicare, Medicaid or managed care program needed by the patient, have an available bed and are willing to accept the patient.  05/19/2013  Patient/family informed of [facility]' ownership interest in [facility], as well as of the fact that they are under no obligation to receive care at this facility.  PASARR submitted to EDS on 05/19/2013 PASARR number received from EDS on 05/19/2013  FL2 transmitted to all facilities in geographic area requested by pt/family on  05/19/2013 FL2 transmitted to all facilities within larger geographic area on   Patient informed that his/her managed care company has contracts with or will negotiate with  certain facilities, including the following:     Patient/family informed of bed offers received:  05/19/2013 Patient chooses bed at [place] recommends and patient chooses bed at    Patient to be transferred to  on  05/20/2013 Patient to be transferred to facility by EMS  The following physician request were entered in Epic:   Additional Comments:  Jeanette Caprice, MSW, [place]

## 2013-05-20 MED ORDER — FOLIC ACID 1 MG PO TABS: 1.0000 mg | ORAL_TABLET | Freq: Every day | ORAL | Status: AC

## 2013-05-20 MED ORDER — THIAMINE HCL 100 MG PO TABS: 100.0000 mg | ORAL_TABLET | Freq: Every day | ORAL | Status: AC

## 2013-05-20 MED ORDER — TAMSULOSIN HCL 0.4 MG PO CAPS: 0.4000 mg | ORAL_CAPSULE | Freq: Every day | ORAL | Status: AC

## 2013-05-20 MED ORDER — ENSURE PUDDING PO PUDG: 1.0000 | Freq: Three times a day (TID) | ORAL | Status: DC

## 2013-05-20 MED ORDER — PANTOPRAZOLE SODIUM 40 MG PO TBEC: 40.0000 mg | DELAYED_RELEASE_TABLET | Freq: Every day | ORAL | Status: DC

## 2013-05-20 MED ORDER — MOMETASONE FURO-FORMOTEROL FUM 100-5 MCG/ACT IN AERO: 2.0000 | INHALATION_SPRAY | Freq: Two times a day (BID) | RESPIRATORY_TRACT | Status: AC

## 2013-05-20 MED ORDER — FUROSEMIDE 40 MG PO TABS
40.0000 mg | ORAL_TABLET | Freq: Every day | ORAL | Status: DC
Start: 2013-05-20 — End: 2017-05-07

## 2013-05-20 MED ORDER — ALBUTEROL SULFATE (2.5 MG/3ML) 0.083% IN NEBU: 2.5000 mg | INHALATION_SOLUTION | Freq: Four times a day (QID) | RESPIRATORY_TRACT | Status: AC | PRN

## 2013-05-20 MED ORDER — RIFAXIMIN 550 MG PO TABS: 550.0000 mg | ORAL_TABLET | Freq: Two times a day (BID) | ORAL | Status: DC

## 2013-05-20 NOTE — Progress Notes (Addendum)
CSW arranged EMS transportation for 1:30pm. CSW signing off at this time. Please re consult if social work needs arises.   CSW spoke to patient's wife over the phone to inform of discharge today to [facility]. Wife was pleased with this information and requested EMS ride for patient. CSW will arrange for transportation once MD clears patient for discharge. CSW notified SNF of discharge today.  Jeanette Caprice, MSW, [place]

## 2013-05-20 NOTE — Progress Notes (Signed)
Speech Language Pathology Treatment: Dysphagia  Patient Details Name: Joseph Hawkins MRN: [id number] DOB: Sep 20, 1934 Today's Date: 05/20/2013 Time: 1010-1040 SLP Time Calculation (min): 30 min  Assessment / Plan / Recommendation Clinical Impression  Pt observed with dys 2 consistency.  No overt s/s aspiration observed or reported. Will recommend advancing diet to dys 2, but continue nectar thick liquids for now. Safe swallow precautions reviewed with pt and posted in room. Discussed MBS results/recommendations with pt, specifically that pt exhibited SILENT ASPIRATION of thin liquids on MBS. Note plan for DC to SNF tomorrow.  Recommend continued ST intervention for diet tolerance and readiness to advance liquids.  Given silent aspiration, objective study may be beneficial, as silent aspiration cannot be detected at bedside.  At this time, however, recommend continuing with nectar thick liquids.   HPI HPI: 79 yo with alcoholic cirrhosis and varices who presented to [facility] with hematemesis due to variceal bleed. Underwent EGD and banding of esophageal varices x4 but had further hematemesis presumed to be due to gastric varices. Intubated 5/6 to 5/8. Transferred to [facility], underwent Balloon-occluded retrograde transvenous obliteration of gastric varices performed by IR.  MBS completed 05/14/13, with rec for Dys 1/ NTL. ST to follow for diet tolerance.   Pertinent Vitals VSS, no pain reported  SLP Plan  Continue with current plan of care    Recommendations Diet recommendations: Dysphagia 2 (fine chop);Nectar-thick liquid Liquids provided via: Cup;Straw Medication Administration: Whole meds with liquid Supervision: Patient able to self feed;Full supervision/cueing for compensatory strategies Compensations: Slow rate;Small sips/bites;Follow solids with liquid;Multiple dry swallows after each bite/sip Postural Changes and/or Swallow Maneuvers: Out of bed for meals;Seated upright 90 degrees              Oral  Care Recommendations: Oral care BID Follow up Recommendations: Skilled Nursing facility Plan: Continue with current plan of care    Bradford B. Quentin Ore [facility], CCC-SLP [phone] [place] 05/20/2013, 10:46 AM

## 2013-05-20 NOTE — Discharge Summary (Signed)
Physician Discharge Summary  Joseph Hawkins [id number] DOB: 08/18/1934 DOA: 05/06/2013  PCP: Lottie Mussel, MD  Admit date: 05/06/2013 Discharge date: 05/20/2013  Time spent: 60 minutes  Recommendations for Outpatient Follow-up:  1. 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   Anemia, acute blood loss   UGIB (upper gastrointestinal bleed)   Hemorrhagic shock   Alcohol withdrawal   Acute renal failure   Alcoholic cirrhosis   Coagulopathy   Acute respiratory failure   Atrial fibrillation with RVR, not a coumadin candidate   Diarrhea, C diff ruled out ascites Deconditioning   Discharge Condition: stable  Filed Weights   05/18/13 0547 05/19/13 0408 05/20/13 0509  Weight: 95.5 kg (210 lb 8.6 oz) 93.2 kg (205 lb 7.5 oz) 93.6 kg (206 lb 5.6 oz)    History of present illness:  79 yo with alcoholic cirrhosis and varices who presented to [facility] with hematemesis due to variceal bleed. Underwent EGD and banding of esophageal varices but had further hematemesis presumed to be due to gastric varices.  Initially admitted to pulmonary critical care. Transferred to [facility] on 5/11  Hospital Course:  Atrial Fibrillation with RVR  -currently rate controlled on present management  Esophageal Varices/GI Bleed/BRTO  -appears to be stable no further bleeding  -Hgb appears stable at this time  Acute blood loss Anemia due to above  -Monitor h/h  Cirrhosis/Ascites  -increased abdominal girth noted  -ultrasound shows ascites  - s/p paracentesis 2.2 L  -ammonia ok  -start lasix/aldactone- monitor labs  Acute respiratory failure  -d/c IVf  -CXR looks good  -continue with inhalers and nebs  -added dulera to his regimen  Acute urinary retention  -added flomax  -voiding trial in AM or at SNF    Esophageal and gastric varices in cirrhosis with bleed:  Patient required transfusion of one unit of packed red blood  cells while in the ICU. Interventional radiology was consulted and performed Balloon-occluded retrograde transvenous obliteration (BRTO) of gastric varices. Patient had no ongoing evidence of bleeding. Will need followup with interventional radiology in 4 weeks for blood work and repeat CAT scan.    Anemia, acute blood loss, transfusion required in the ICU. Stable since.  Please monitor CBC.    Hemorrhagic shock due to acute upper GI bleed, stabilized    Alcohol withdrawal: Patient did have delirium in the ICU and required intubation for procedures and stabilization. Ativan protocol. Wife reported ongoing alcohol use prior to admission. Currently alert oriented and appropriate.   Acute renal failure: Secondary to shock, resolved.     Alcoholic cirrhosis    Coagulopathy secondary to above     Acute respiratory failure, required intubation mechanical ventilation, currently with normal oxygen saturations and no dyspnea. Clear lung sounds.     Atrial fibrillation with RVR develop during hospitalization. No known history of same. Likely due to acute illness. Started on metoprolol. not a coumadin candidate due to bleeding varices and alcohol abuse.     Diarrhea, C diff ruled out  ascites, status post paracentesis yielding over 2 L of clear yellow fluid. Culture today is negative. Patient was started on Lasix and spironolactone. Please monitor her weights and basic metabolic panel  Deconditioning patient will go to skilled nursing facility for rehabilitation   Dysphagia: After procedure and extubation, speech therapy recommended pured diet with nectar thickened liquids. This has been advanced to dysphagia 2/chopped with nectar thickened liquids. Speech therapy  will need to continue to follow at facility for her diet advancement.  Patient reportedly had urinary retention during hospitalization. Flomax was started. Foley has been removed today. Please monitor for urinary retention and if present  replace Foley catheter.  Procedures: Balloon-occluded retrograde transvenous obliteration (BRTO) of gastric varices  Intubation mechanical ventilation Right femoral Cordis was placed at outside facility has since been removed.   left subclavian central line, removed Left radial A-line, removed Paracentesis  Consultations:  Pulmonary critical care  interventional radiology  Discharge Exam: Filed Vitals:   05/20/13 0509  BP: 133/53  Pulse: 81  Temp: 97.7 F (36.5 C)  Resp: 18    General: alert, oriented. Cardiovascular: regular rate rhythm without murmurs gallops rubs  Respiratory: Clear to auscultation bilaterally without wheeze rhonchi or rales abdomen distended soft nontender normal bowel sounds Extremities: 1+ edema in  Discharge Instructions   Discharge instructions    Complete by:  As directed   Chopped diet with nectar thickened liquids and aspiration precautions.  Speech therapy, Physical therapy, Occupational therapy to continue to  Follow at SNF. Monitor urine output. Replace foley if unable to void.  Monitor BMET and CBC within one week. Monitor weights.     Walk with assistance    Complete by:  As directed             Medication List    STOP taking these medications       aspirin EC 81 MG tablet     lansoprazole 15 MG capsule  Commonly known as:  PREVACID     sucralfate 1 G tablet  Commonly known as:  CARAFATE      TAKE these medications       albuterol (2.5 MG/3ML) 0.083% nebulizer solution  Commonly known as:  PROVENTIL  Take 3 mLs (2.5 mg total) by nebulization every 6 (six) hours as needed for wheezing or shortness of breath.     allopurinol 300 MG tablet  Commonly known as:  ZYLOPRIM  Take 300 mg by mouth daily.     cholecalciferol 1000 UNITS tablet  Commonly known as:  VITAMIN D  Take 1,000 Units by mouth daily.     feeding supplement (ENSURE) Pudg  Take 1 Container by mouth 3 (three) times daily between meals.     FERROUSUL 325  (65 FE) MG tablet  Generic drug:  ferrous sulfate  Take 325 mg by mouth daily with breakfast.     folic acid 1 MG tablet  Commonly known as:  FOLVITE  Take 1 tablet (1 mg total) by mouth daily.     furosemide 40 MG tablet  Commonly known as:  LASIX  Take 1 tablet (40 mg total) by mouth daily.     levothyroxine 75 MCG tablet  Commonly known as:  SYNTHROID, LEVOTHROID  Take 75 mcg by mouth daily before breakfast.     mometasone-formoterol 100-5 MCG/ACT Aero  Commonly known as:  DULERA  Inhale 2 puffs into the lungs 2 (two) times daily.     nadolol 40 MG tablet  Commonly known as:  CORGARD  Take 40 mg by mouth daily.     pantoprazole 40 MG tablet  Commonly known as:  PROTONIX  Take 1 tablet (40 mg total) by mouth daily.     rifaximin 550 MG Tabs tablet  Commonly known as:  XIFAXAN  Take 1 tablet (550 mg total) by mouth 2 (two) times daily.     spironolactone 25 MG tablet  Commonly known as:  ALDACTONE  Take 25 mg by mouth daily.     tamsulosin 0.4 MG Caps capsule  Commonly known as:  FLOMAX  Take 1 capsule (0.4 mg total) by mouth daily.     thiamine 100 MG tablet  Take 1 tablet (100 mg total) by mouth daily.       No Known Allergies     Follow-up Information   Follow up with SNF provider. (within one week)       Follow up with [facility], HEATH K, MD In 4 weeks. (IR clinic [phone]). )    Specialty:  Interventional Radiology   Contact information:   [place] [id number]        The results of significant diagnostics from this hospitalization (including imaging, microbiology, ancillary and laboratory) are listed below for reference.    Significant Diagnostic Studies: Ct Angio Abdomen W/cm &/or Wo Contrast  05/06/2013   CLINICAL DATA:  Cirrhosis and bleeding varices.  EXAM: CT ANGIOGRAPHY ABDOMEN AND PELVIS  TECHNIQUE: Multidetector CT imaging of the abdomen and pelvis was performed using the standard protocol during bolus administration  of intravenous contrast. Multiplanar reconstructed images including MIPs were obtained and reviewed to evaluate the vascular anatomy.  CONTRAST:  68mL OMNIPAQUE IOHEXOL 350 MG/ML SOLN  FINDINGS: ARTERIAL FINDINGS:  Aorta:                Mild atherosclerotic plaque without aneurysm.  Celiac axis: Celiac trunk is patent. Main branches of the celiac trunk are the left gastric artery and the splenic artery.  Superior mesenteric: Variant anatomy. The common hepatic artery originates from the proximal SMA. SMA is patent.  Left renal:           Patent.  Right renal:          Patent.  Inferior mesenteric:  Patent.  Left iliac:           Left common iliac artery is patent.  Right iliac:          Right common iliac artery is patent.  Venous findings: The portal veins, SMV and splenic vein are patent. IVC is patent. Renal veins are patent. There are large varices around the gastric cardia and fundus that drain into the left renal vein and consistent with a gastrorenal shunt. No significant esophageal varices.  Review of the MIP images confirms the above findings.  Nonvascular findings: There is volume loss and atelectasis at both lung bases. No evidence for free air. The liver is very nodular and consistent with cirrhosis. Spleen size is normal. No gross abnormality to the adrenal glands, kidneys or pancreas. No evidence for abdominal ascites. The bone structures are heterogeneous but no focal abnormality. Gallstone without evidence of gallbladder wall dilatation or inflammatory changes.  IMPRESSION: Gastric varices involving the gastric fundus and cardia. These varices are draining into the left renal vein and consistent with a gastrorenal shunt. No significant esophageal varices.  Cirrhosis.  Portal venous system is patent.  Variant arterial anatomy. The common hepatic artery originates from the superior mesenteric artery.   Electronically Signed   By: Markus Daft M.D.   On: 05/06/2013 19:05   US Abdomen  Complete  05/17/2013   CLINICAL DATA:  Abdominal distension.  History of cirrhosis.  EXAM: ULTRASOUND ABDOMEN COMPLETE  COMPARISON:  DG ABD PORTABLE 1V dated 05/09/2013; CT ABD WO/W CM dated 05/09/2013  FINDINGS: Gallbladder:  Dependent sludge suspect sludge and stones are noted within the gallbladder, stones not individually measurable.  No gallbladder wall thickening. No sonographic Murphy sign. Ascites is present.  Common bile duct:  Diameter: 4 mm  Liver:  Nodular contour with increased echogenicity compatible with cirrhosis.  IVC:  No abnormality visualized.  Pancreas:  Visualized portion unremarkable.  Spleen:  Size and appearance within normal limits.  Right Kidney:  Length: 10.4 cm. Echogenicity within normal limits. No mass or hydronephrosis visualized.  Left Kidney:  Length: 10.3 cm. Echogenicity within normal limits. No mass or hydronephrosis visualized.  Abdominal aorta:  Proximal aorta normal in caliber. Mid and distal aorta obscured by bowel gas.  Other findings:  Ascites  IMPRESSION: Nodular hepatic contour compatible with cirrhosis with ascites present.   Electronically Signed   By: Conchita Paris M.D.   On: 05/17/2013 09:43   Ir Angiogram Selective Each Additional Vessel  05/08/2013   CLINICAL DATA:  79 year old male with cirrhosis complicated by bleeding esophageal and gastric varices. Patient underwent recent EGD and banding of varices x4 but developed recurrent large volume spontaneous hemoptysis requiring transfer to [facility]. Ultimately, the patient was intubated for altered mental status likely due to hepatic encephalopathy. Patient is a candidate for balloon-occluded retrograde trans venous obliteration (BRTO) of his large gastric varices.  EXAM: ADDITIONAL ARTERIOGRAPHY; IR EMBO VENOUS NOT HEMORR HEMANG INC GUIDE ROADMAPPING; ARTERIOGRAPHY; IR ULTRASOUND GUIDANCE VASC ACCESS RIGHT; LEFT RENAL VENOGRAPHY  Date: 05/08/2013  PROCEDURE: 1. Ultrasound-guided puncture of the right common  femoral vein 2. Catheterization of the left renal vein with left renal venogram 3. Catheterization of the common trunk of the left adrenal pain and gastric renal shunt with venogram 4. Catheterization of the left inferior phrenic pain with venogram 5. Catheterization of the gastric renal shunt 6. Balloon occluded retrograde venogram of the-year renal shunt an gastric varix 7. Balloon occluded retrograde transvenous obliteration of gastric varix 8. Coil embolization of the proximal gastric renal shunt Interventional Radiologist:  Criselda Peaches, MD  ANESTHESIA/SEDATION: General anesthesia was provided by the anesthesiology service.  FLUOROSCOPY TIME:  15 min 42 seconds  CONTRAST:  80mL OMNIPAQUE IOHEXOL 300 MG/ML  SOLN  TECHNIQUE: Informed consent was obtained from the patient following explanation of the procedure, risks, benefits and alternatives. The patient understands, agrees and consents for the procedure. All questions were addressed. A time out was performed.  Maximal barrier sterile technique utilized including caps, mask, sterile gowns, sterile gloves, large sterile drape, hand hygiene, and Betadine skin prep.  The right groin was interrogated with ultrasound. The common femoral vein was found to be widely patent. There is an existing right common femoral central venous catheter. Local anesthesia was attained by infiltration with 1% lidocaine. A small dermatotomy was made. Under real-time sonographic guidance, the right common femoral vein was punctured with a 21 gauge micropuncture needle. An image was obtained and stored for the medical record. With the AV transitional 4 French micro sheath, an Amplatz wire was advanced into the IVC. A 10 French TIPS sheath was then advanced over the wire and into the IVC. A C2 Cobra catheter and Bentson wire were used to catheterize the left renal vein. A left renal venogram was performed. 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 No significant reflux  down the left gonadal vein.  A Rosen wire was advanced into the inferior pole renal vein. A cobra catheter was then removed. Its sheath was advanced into the renal vein. A DSA run was performed identifying the origin of the gastric renal shunt. An angled catheter was advanced coaxially  through the TIPS sheath a glidewire used to select the left inferior phrenic vein. An inferior phrenic venogram was performed. No significant communication with the gastric varices.  The catheter was then brought back into the main body of the gastric renal shunt. At and Amplatz wire was advanced to the gastric renal shunt negative sheath advanced over the wire into the proximal shunt. An 11 mm fossa scientific standard balloon occluded catheter was advanced over the wire. The balloon was inflated and core against a web in the proximal gastric renal shunt. A balloon occluded retrograde venogram was performed of the gastric renal shunts. The gastric varix was successfully identified. The eat a fair its posterior gastric vein was also successfully identified. The balloon was deflated and contrast allow the washout. A high-flow Renegade micro catheter was then advanced over a fat 60 wire deep into the gastric renal shunt. A final test inflation of the occlusion balloon was performed. Contrast is nicely static within the gastric varix and CT renal shunt. No significant gait vessels requiring embolization.  Foam sclerosus was then mixed in the standard 3:2:1 ratio of air to sodium tetradecyl sulfate to lipiodol. Approximately 15 mm of foam sclerosant was injected completely filling the gastric varix,the gastro renal shunt and extending into the proximal aspect of the posterior gastric vein. This represents approximately 5.5 mL of STS.  The proximal aspect of the gastro renal shunt just distal to the occlusion balloon was then coil embolized with 0.035 detachable coils. Two 20 x 400 mm interlock coils followed by a 15 x 150 and a 12 x 150  coil. Due to an emergent case the follow, the occlusion balloon was left inflated and she lesion balloon were stored right groin. The patient was returned to the ICU in stable condition. He will be brought back to Interventional Radiology in the morning for balloon deflation under fluoroscopic visualization.  IMPRESSION: 1. Technically successful balloon occluded retrograde trans venous obliteration of gastric varices. 2. Coil embolization of the proximal aspect of the efferent gastrorenal shunt. 3. Occlusion balloon left in place to secure the foam sclerosant overnight. The patient will return to interventional radiology in the morning or balloon takedown under fluoroscopic visualization. Patient will require continued surveillance for recurrence of high risk esophageal varices. I will communicate with the patient's primary Gastroenterologist to ensure follow-up.  Signed,  Criselda Peaches, MD  [facility]  [facility]   Electronically Signed   By: Jacqulynn Cadet M.D.   On: 05/08/2013 17:28   Ir Angiogram Selective Each Additional Vessel  05/08/2013   CLINICAL DATA:  79 year old male with cirrhosis complicated by bleeding esophageal and gastric varices. Patient underwent recent EGD and banding of varices x4 but developed recurrent large volume spontaneous hemoptysis requiring transfer to [facility]. Ultimately, the patient was intubated for altered mental status likely due to hepatic encephalopathy. Patient is a candidate for balloon-occluded retrograde trans venous obliteration (BRTO) of his large gastric varices.  EXAM: ADDITIONAL ARTERIOGRAPHY; IR EMBO VENOUS NOT HEMORR HEMANG INC GUIDE ROADMAPPING; ARTERIOGRAPHY; IR ULTRASOUND GUIDANCE VASC ACCESS RIGHT; LEFT RENAL VENOGRAPHY  Date: 05/08/2013  PROCEDURE: 1. Ultrasound-guided puncture of the right common femoral vein 2. Catheterization of the left renal vein with left renal venogram 3.  Catheterization of the common trunk of the left adrenal pain and gastric renal shunt with venogram 4. Catheterization of the left inferior phrenic pain with venogram 5. Catheterization of the gastric renal shunt 6. Balloon occluded retrograde venogram of the-year  renal shunt an gastric varix 7. Balloon occluded retrograde transvenous obliteration of gastric varix 8. Coil embolization of the proximal gastric renal shunt Interventional Radiologist:  Criselda Peaches, MD  ANESTHESIA/SEDATION: General anesthesia was provided by the anesthesiology service.  FLUOROSCOPY TIME:  15 min 42 seconds  CONTRAST:  39mL OMNIPAQUE IOHEXOL 300 MG/ML  SOLN  TECHNIQUE: Informed consent was obtained from the patient following explanation of the procedure, risks, benefits and alternatives. The patient understands, agrees and consents for the procedure. All questions were addressed. A time out was performed.  Maximal barrier sterile technique utilized including caps, mask, sterile gowns, sterile gloves, large sterile drape, hand hygiene, and Betadine skin prep.  The right groin was interrogated with ultrasound. The common femoral vein was found to be widely patent. There is an existing right common femoral central venous catheter. Local anesthesia was attained by infiltration with 1% lidocaine. A small dermatotomy was made. Under real-time sonographic guidance, the right common femoral vein was punctured with a 21 gauge micropuncture needle. An image was obtained and stored for the medical record. With the AV transitional 4 French micro sheath, an Amplatz wire was advanced into the IVC. A 10 French TIPS sheath was then advanced over the wire and into the IVC. A C2 Cobra catheter and Bentson wire were used to catheterize the left renal vein. A left renal venogram was performed. 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 No significant reflux down the left gonadal vein.  A Rosen wire was advanced into the inferior pole renal  vein. A cobra catheter was then removed. Its sheath was advanced into the renal vein. A DSA run was performed identifying the origin of the gastric renal shunt. An angled catheter was advanced coaxially through the TIPS sheath a glidewire used to select the left inferior phrenic vein. An inferior phrenic venogram was performed. No significant communication with the gastric varices.  The catheter was then brought back into the main body of the gastric renal shunt. At and Amplatz wire was advanced to the gastric renal shunt negative sheath advanced over the wire into the proximal shunt. An 11 mm fossa scientific standard balloon occluded catheter was advanced over the wire. The balloon was inflated and core against a web in the proximal gastric renal shunt. A balloon occluded retrograde venogram was performed of the gastric renal shunts. The gastric varix was successfully identified. The eat a fair its posterior gastric vein was also successfully identified. The balloon was deflated and contrast allow the washout. A high-flow Renegade micro catheter was then advanced over a fat 60 wire deep into the gastric renal shunt. A final test inflation of the occlusion balloon was performed. Contrast is nicely static within the gastric varix and CT renal shunt. No significant gait vessels requiring embolization.  Foam sclerosus was then mixed in the standard 3:2:1 ratio of air to sodium tetradecyl sulfate to lipiodol. Approximately 15 mm of foam sclerosant was injected completely filling the gastric varix,the gastro renal shunt and extending into the proximal aspect of the posterior gastric vein. This represents approximately 5.5 mL of STS.  The proximal aspect of the gastro renal shunt just distal to the occlusion balloon was then coil embolized with 0.035 detachable coils. Two 20 x 400 mm interlock coils followed by a 15 x 150 and a 12 x 150 coil. Due to an emergent case the follow, the occlusion balloon was left inflated and  she lesion balloon were stored right groin. The patient was returned  to the ICU in stable condition. He will be brought back to Interventional Radiology in the morning for balloon deflation under fluoroscopic visualization.  IMPRESSION: 1. Technically successful balloon occluded retrograde trans venous obliteration of gastric varices. 2. Coil embolization of the proximal aspect of the efferent gastrorenal shunt. 3. Occlusion balloon left in place to secure the foam sclerosant overnight. The patient will return to interventional radiology in the morning or balloon takedown under fluoroscopic visualization. Patient will require continued surveillance for recurrence of high risk esophageal varices. I will communicate with the patient's primary Gastroenterologist to ensure follow-up.  Signed,  Criselda Peaches, MD  [facility]  [facility]   Electronically Signed   By: Jacqulynn Cadet M.D.   On: 05/08/2013 17:28   Ir Venogram Renal Uni Left  05/08/2013   CLINICAL DATA:  79 year old male with cirrhosis complicated by bleeding esophageal and gastric varices. Patient underwent recent EGD and banding of varices x4 but developed recurrent large volume spontaneous hemoptysis requiring transfer to [facility]. Ultimately, the patient was intubated for altered mental status likely due to hepatic encephalopathy. Patient is a candidate for balloon-occluded retrograde trans venous obliteration (BRTO) of his large gastric varices.  EXAM: ADDITIONAL ARTERIOGRAPHY; IR EMBO VENOUS NOT HEMORR HEMANG INC GUIDE ROADMAPPING; ARTERIOGRAPHY; IR ULTRASOUND GUIDANCE VASC ACCESS RIGHT; LEFT RENAL VENOGRAPHY  Date: 05/08/2013  PROCEDURE: 1. Ultrasound-guided puncture of the right common femoral vein 2. Catheterization of the left renal vein with left renal venogram 3. Catheterization of the common trunk of the left adrenal pain and gastric renal shunt with venogram 4. Catheterization  of the left inferior phrenic pain with venogram 5. Catheterization of the gastric renal shunt 6. Balloon occluded retrograde venogram of the-year renal shunt an gastric varix 7. Balloon occluded retrograde transvenous obliteration of gastric varix 8. Coil embolization of the proximal gastric renal shunt Interventional Radiologist:  Criselda Peaches, MD  ANESTHESIA/SEDATION: General anesthesia was provided by the anesthesiology service.  FLUOROSCOPY TIME:  15 min 42 seconds  CONTRAST:  62mL OMNIPAQUE IOHEXOL 300 MG/ML  SOLN  TECHNIQUE: Informed consent was obtained from the patient following explanation of the procedure, risks, benefits and alternatives. The patient understands, agrees and consents for the procedure. All questions were addressed. A time out was performed.  Maximal barrier sterile technique utilized including caps, mask, sterile gowns, sterile gloves, large sterile drape, hand hygiene, and Betadine skin prep.  The right groin was interrogated with ultrasound. The common femoral vein was found to be widely patent. There is an existing right common femoral central venous catheter. Local anesthesia was attained by infiltration with 1% lidocaine. A small dermatotomy was made. Under real-time sonographic guidance, the right common femoral vein was punctured with a 21 gauge micropuncture needle. An image was obtained and stored for the medical record. With the AV transitional 4 French micro sheath, an Amplatz wire was advanced into the IVC. A 10 French TIPS sheath was then advanced over the wire and into the IVC. A C2 Cobra catheter and Bentson wire were used to catheterize the left renal vein. A left renal venogram was performed. 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 No significant reflux down the left gonadal vein.  A Rosen wire was advanced into the inferior pole renal vein. A cobra catheter was then removed. Its sheath was advanced into the renal vein. A DSA run was performed  identifying the origin of the gastric renal shunt. An angled catheter was advanced coaxially through  the TIPS sheath a glidewire used to select the left inferior phrenic vein. An inferior phrenic venogram was performed. No significant communication with the gastric varices.  The catheter was then brought back into the main body of the gastric renal shunt. At and Amplatz wire was advanced to the gastric renal shunt negative sheath advanced over the wire into the proximal shunt. An 11 mm fossa scientific standard balloon occluded catheter was advanced over the wire. The balloon was inflated and core against a web in the proximal gastric renal shunt. A balloon occluded retrograde venogram was performed of the gastric renal shunts. The gastric varix was successfully identified. The eat a fair its posterior gastric vein was also successfully identified. The balloon was deflated and contrast allow the washout. A high-flow Renegade micro catheter was then advanced over a fat 60 wire deep into the gastric renal shunt. A final test inflation of the occlusion balloon was performed. Contrast is nicely static within the gastric varix and CT renal shunt. No significant gait vessels requiring embolization.  Foam sclerosus was then mixed in the standard 3:2:1 ratio of air to sodium tetradecyl sulfate to lipiodol. Approximately 15 mm of foam sclerosant was injected completely filling the gastric varix,the gastro renal shunt and extending into the proximal aspect of the posterior gastric vein. This represents approximately 5.5 mL of STS.  The proximal aspect of the gastro renal shunt just distal to the occlusion balloon was then coil embolized with 0.035 detachable coils. Two 20 x 400 mm interlock coils followed by a 15 x 150 and a 12 x 150 coil. Due to an emergent case the follow, the occlusion balloon was left inflated and she lesion balloon were stored right groin. The patient was returned to the ICU in stable condition. He will  be brought back to Interventional Radiology in the morning for balloon deflation under fluoroscopic visualization.  IMPRESSION: 1. Technically successful balloon occluded retrograde trans venous obliteration of gastric varices. 2. Coil embolization of the proximal aspect of the efferent gastrorenal shunt. 3. Occlusion balloon left in place to secure the foam sclerosant overnight. The patient will return to interventional radiology in the morning or balloon takedown under fluoroscopic visualization. Patient will require continued surveillance for recurrence of high risk esophageal varices. I will communicate with the patient's primary Gastroenterologist to ensure follow-up.  Signed,  Criselda Peaches, MD  [facility]  [facility]   Electronically Signed   By: Jacqulynn Cadet M.D.   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 Patient underwent recent EGD and banding of varices x4 but developed recurrent large volume spontaneous hemoptysis requiring transfer to [facility]. Ultimately, the patient was intubated for altered mental status likely due to hepatic encephalopathy. Patient is a candidate for balloon-occluded retrograde trans venous obliteration (BRTO) of his large gastric varices.  EXAM: ADDITIONAL ARTERIOGRAPHY; IR EMBO VENOUS NOT HEMORR HEMANG INC GUIDE ROADMAPPING; ARTERIOGRAPHY; IR ULTRASOUND GUIDANCE VASC ACCESS RIGHT; LEFT RENAL VENOGRAPHY  Date: 05/08/2013  PROCEDURE: 1. Ultrasound-guided puncture of the right common femoral vein 2. Catheterization of the left renal vein with left renal venogram 3. Catheterization of the common trunk of the left adrenal pain and gastric renal shunt with venogram 4. Catheterization of the left inferior phrenic pain with venogram 5. Catheterization of the gastric renal shunt 6. Balloon  occluded retrograde venogram of the-year renal shunt  an gastric varix 7. Balloon occluded retrograde transvenous obliteration of gastric varix 8. Coil embolization of the proximal gastric renal shunt Interventional Radiologist:  Criselda Peaches, MD  ANESTHESIA/SEDATION: General anesthesia was provided by the anesthesiology service.  FLUOROSCOPY TIME:  15 min 42 seconds  CONTRAST:  71mL OMNIPAQUE IOHEXOL 300 MG/ML  SOLN  TECHNIQUE: Informed consent was obtained from the patient following explanation of the procedure, risks, benefits and alternatives. The patient understands, agrees and consents for the procedure. All questions were addressed. A time out was performed.  Maximal barrier sterile technique utilized including caps, mask, sterile gowns, sterile gloves, large sterile drape, hand hygiene, and Betadine skin prep.  The right groin was interrogated with ultrasound. The common femoral vein was found to be widely patent. There is an existing right common femoral central venous catheter. Local anesthesia was attained by infiltration with 1% lidocaine. A small dermatotomy was made. Under real-time sonographic guidance, the right common femoral vein was punctured with a 21 gauge micropuncture needle. An image was obtained and stored for the medical record. With the AV transitional 4 French micro sheath, an Amplatz wire was advanced into the IVC. A 10 French TIPS sheath was then advanced over the wire and into the IVC. A C2 Cobra catheter and Bentson wire were used to catheterize the left renal vein. A left renal venogram was performed. 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 No significant reflux down the left gonadal vein.  A Rosen wire was advanced into the inferior pole renal vein. A cobra catheter was then removed. Its sheath was advanced into the renal vein. A DSA run was performed identifying the origin of the gastric renal shunt. An angled catheter was advanced coaxially through the TIPS  sheath a glidewire used to select the left inferior phrenic vein. An inferior phrenic venogram was performed. No significant communication with the gastric varices.  The catheter was then brought back into the main body of the gastric renal shunt. At and Amplatz wire was advanced to the gastric renal shunt negative sheath advanced over the wire into the proximal shunt. An 11 mm fossa scientific standard balloon occluded catheter was advanced over the wire. The balloon was inflated and core against a web in the proximal gastric renal shunt. A balloon occluded retrograde venogram was performed of the gastric renal shunts. The gastric varix was successfully identified. The eat a fair its posterior gastric vein was also successfully identified. The balloon was deflated and contrast allow the washout. A high-flow Renegade micro catheter was then advanced over a fat 60 wire deep into the gastric renal shunt. A final test inflation of the occlusion balloon was performed. Contrast is nicely static within the gastric varix and CT renal shunt. No significant gait vessels requiring embolization.  Foam sclerosus was then mixed in the standard 3:2:1 ratio of air to sodium tetradecyl sulfate to lipiodol. Approximately 15 mm of foam sclerosant was injected completely filling the gastric varix,the gastro renal shunt and extending into the proximal aspect of the posterior gastric vein. This represents approximately 5.5 mL of STS.  The proximal aspect of the gastro renal shunt just distal to the occlusion balloon was then coil embolized with 0.035 detachable coils. Two 20 x 400 mm interlock coils followed by a 15 x 150 and a 12 x 150 coil. Due to an emergent case the follow, the occlusion balloon was left inflated and she lesion balloon were stored right groin. The patient was returned to the ICU  in stable condition. He will be brought back to Interventional Radiology in the morning for balloon deflation under fluoroscopic  visualization.  IMPRESSION: 1. Technically successful balloon occluded retrograde trans venous obliteration of gastric varices. 2. Coil embolization of the proximal aspect of the efferent gastrorenal shunt. 3. Occlusion balloon left in place to secure the foam sclerosant overnight. The patient will return to interventional radiology in the morning or balloon takedown under fluoroscopic visualization. Patient will require continued surveillance for recurrence of high risk esophageal varices. I will communicate with the patient's primary Gastroenterologist to ensure follow-up.  Signed,  Criselda Peaches, MD  [facility]  [facility]   Electronically Signed   By: Jacqulynn Cadet M.D.   On: 05/08/2013 17:28   US Paracentesis  05/18/2013   CLINICAL DATA:  Abdominal ascites  EXAM: ULTRASOUND GUIDED LEFT LOWER QUADRANT PARACENTESIS  COMPARISON:  None.  PROCEDURE: An ultrasound guided paracentesis was thoroughly discussed with the patient and questions answered. The benefits, risks, alternatives and complications were also discussed. The patient understands and wishes to proceed with the procedure. Written consent was obtained.  Ultrasound was performed to localize and mark an adequate pocket of fluid in the left lower quadrant of the abdomen. The area was then prepped and draped in the normal sterile fashion. 1% Lidocaine was used for local anesthesia. Under ultrasound guidance a 19 gauge Yueh catheter was introduced. Paracentesis was performed. The catheter was removed and a dressing applied.  Complications: None.  FINDINGS: A total of approximately 2.2 L of yellow fluid was removed. A fluid sample was sent for laboratory analysis.  IMPRESSION: Successful ultrasound guided paracentesis yielding 2.2 L of ascites.  Read by: Jannifer [facility]   Electronically Signed   By: Arne Cleveland M.D.   On: 05/18/2013 11:08   Ir Fluoro Rm 30-60 Min  05/08/2013   CLINICAL DATA:   Bleeding gastric varices 456.8 , post Balloon-occluded retrograde transvenous obliteration (BRTO) of gastric varices. Right common femoral vein 79F sheath and balloon occlusion catheter still in place  EXAM: IR FLOURO RM 0-60 MIN  MEDICATIONS: None  CONTRAST:  None  FLUOROSCOPY TIME:  48 sec  PROCEDURE: The previously placed right femoral sheath and catheter were prepped and draped in usual sterile fashion. A Bentson guidewire was advanced through the catheter and the catheter was gently removed. The long venous sheath was then also with drawn, with no change in morphology of the embolization material. The catheter was removed and hemostasis achieved at the site with manual compression. The patient tolerated the procedure well.  Complications:  No immediate complication  FINDINGS: Fluoroscopic inspection demonstrated stable appearance of the embolization material and coils in the left upper abdomen. The balloon of the occlusion balloon catheter was noted to no longer be inflated although the catheter remained in stable in position. A spot radiograph of the chest shows no evidence of migration of embolic material. After removal of the occlusion catheter, Embolization material and coils remain stable in position.  IMPRESSION: 1. Stable left upper abdominal coils and embolization material. The occlusion catheter and venous sheath were removed.   Electronically Signed   By: Arne Cleveland M.D.   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 Patient underwent recent EGD and banding of varices x4 but developed recurrent large volume spontaneous hemoptysis requiring transfer to [facility]. Ultimately,  the patient was intubated for altered mental status likely due to hepatic encephalopathy. Patient is a candidate for balloon-occluded retrograde trans venous obliteration (BRTO) of his large  gastric varices.  EXAM: ADDITIONAL ARTERIOGRAPHY; IR EMBO VENOUS NOT HEMORR HEMANG INC GUIDE ROADMAPPING; ARTERIOGRAPHY; IR ULTRASOUND GUIDANCE VASC ACCESS RIGHT; LEFT RENAL VENOGRAPHY  Date: 05/08/2013  PROCEDURE: 1. Ultrasound-guided puncture of the right common femoral vein 2. Catheterization of the left renal vein with left renal venogram 3. Catheterization of the common trunk of the left adrenal pain and gastric renal shunt with venogram 4. Catheterization of the left inferior phrenic pain with venogram 5. Catheterization of the gastric renal shunt 6. Balloon occluded retrograde venogram of the-year renal shunt an gastric varix 7. Balloon occluded retrograde transvenous obliteration of gastric varix 8. Coil embolization of the proximal gastric renal shunt Interventional Radiologist:  Criselda Peaches, MD  ANESTHESIA/SEDATION: General anesthesia was provided by the anesthesiology service.  FLUOROSCOPY TIME:  15 min 42 seconds  CONTRAST:  69mL OMNIPAQUE IOHEXOL 300 MG/ML  SOLN  TECHNIQUE: Informed consent was obtained from the patient following explanation of the procedure, risks, benefits and alternatives. The patient understands, agrees and consents for the procedure. All questions were addressed. A time out was performed.  Maximal barrier sterile technique utilized including caps, mask, sterile gowns, sterile gloves, large sterile drape, hand hygiene, and Betadine skin prep.  The right groin was interrogated with ultrasound. The common femoral vein was found to be widely patent. There is an existing right common femoral central venous catheter. Local anesthesia was attained by infiltration with 1% lidocaine. A small dermatotomy was made. Under real-time sonographic guidance, the right common femoral vein was punctured with a 21 gauge micropuncture needle. An image was obtained and stored for the medical record. With the AV transitional 4 French micro sheath, an Amplatz wire was advanced into the IVC. A 10  French TIPS sheath was then advanced over the wire and into the IVC. A C2 Cobra catheter and Bentson wire were used to catheterize the left renal vein. A left renal venogram was performed. 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 No significant reflux down the left gonadal vein.  A Rosen wire was advanced into the inferior pole renal vein. A cobra catheter was then removed. Its sheath was advanced into the renal vein. A DSA run was performed identifying the origin of the gastric renal shunt. An angled catheter was advanced coaxially through the TIPS sheath a glidewire used to select the left inferior phrenic vein. An inferior phrenic venogram was performed. No significant communication with the gastric varices.  The catheter was then brought back into the main body of the gastric renal shunt. At and Amplatz wire was advanced to the gastric renal shunt negative sheath advanced over the wire into the proximal shunt. An 11 mm fossa scientific standard balloon occluded catheter was advanced over the wire. The balloon was inflated and core against a web in the proximal gastric renal shunt. A balloon occluded retrograde venogram was performed of the gastric renal shunts. The gastric varix was successfully identified. The eat a fair its posterior gastric vein was also successfully identified. The balloon was deflated and contrast allow the washout. A high-flow Renegade micro catheter was then advanced over a fat 60 wire deep into the gastric renal shunt. A final test inflation of the occlusion balloon was performed. Contrast is nicely static within the gastric varix and CT renal shunt. No significant gait vessels requiring embolization.  Foam  sclerosus was then mixed in the standard 3:2:1 ratio of air to sodium tetradecyl sulfate to lipiodol. Approximately 15 mm of foam sclerosant was injected completely filling the gastric varix,the gastro renal shunt and extending into the proximal aspect of the posterior  gastric vein. This represents approximately 5.5 mL of STS.  The proximal aspect of the gastro renal shunt just distal to the occlusion balloon was then coil embolized with 0.035 detachable coils. Two 20 x 400 mm interlock coils followed by a 15 x 150 and a 12 x 150 coil. Due to an emergent case the follow, the occlusion balloon was left inflated and she lesion balloon were stored right groin. The patient was returned to the ICU in stable condition. He will be brought back to Interventional Radiology in the morning for balloon deflation under fluoroscopic visualization.  IMPRESSION: 1. Technically successful balloon occluded retrograde trans venous obliteration of gastric varices. 2. Coil embolization of the proximal aspect of the efferent gastrorenal shunt. 3. Occlusion balloon left in place to secure the foam sclerosant overnight. The patient will return to interventional radiology in the morning or balloon takedown under fluoroscopic visualization. Patient will require continued surveillance for recurrence of high risk esophageal varices. I will communicate with the patient's primary Gastroenterologist to ensure follow-up.  Signed,  Criselda Peaches, MD  [facility]  [facility]   Electronically Signed   By: Jacqulynn Cadet M.D.   On: 05/08/2013 17:28   Ct Abd Wo & W Cm  05/09/2013   CLINICAL DATA:  Post BRTO: Evaluate gastric varices and portal vein  EXAM: CT ABDOMEN WITHOUT AND WITH CONTRAST  TECHNIQUE: Multidetector CT imaging of the abdomen was performed following the standard protocol before and following the bolus administration of intravenous contrast.  CONTRAST:  12mL OMNIPAQUE IOHEXOL 300 MG/ML  SOLN  COMPARISON:  Prior CT abdomen/pelvis 05/06/2013  FINDINGS: Lower Chest: Small bilateral layering pleural effusions with associated bibasilar atelectasis. Stable mild cardiomegaly. Atherosclerotic calcifications noted in the coronary arteries. No  pericardial effusion. Unremarkable visualized distal thoracic esophagus.  Abdomen: Interval development of small to moderate ascites. High attenuation sclerosing material noted throughout the gastric varices and gastro renal shunts. A small amount of contrast material extends from the gastro renal shunt into the left renal vein. On the coronal reformatted images the sclerosing layers along the superior aspect of the renal vein. This is nonocclusive. The renal vein remains patent. No sclerosing identified within the splenic or portal vein. Sclerosant enters the proximal aspect of the afferent posterior gastric vein.  No gastric wall thickening or acute abnormality. High attenuation material layering within the stomach likely represents residual blood clot. Similar appearance of hepatic cirrhosis without significant interval change. No new varices identified. Unremarkable appearance of the bilateral kidneys. No focal solid lesion, hydronephrosis or nephrolithiasis. Unremarkable appearance of the spleen, pancreas and adrenal glands. No evidence of a bowel obstruction or focal bowel wall thickening. Mild mesenteric edema. Cholelithiasis again noted.  Bones/Soft Tissues: No acute fracture or aggressive appearing lytic or blastic osseous lesion. Body wall edema.  IMPRESSION: 1. Successful obliteration of gastric varices and gastrorenal shunt. A small amount of non occlusive sclerosant material is present with in the left renal vein. Concurrently, the left renal vein remains patent. The splenic and portal veins also remain patent. 2. Small bilateral layering effusions, body wall edema, new abdominal ascites and mesenteric edema. Taken together, findings likely reflect third-spacing versus volume overload. 3. High attenuation material layering within the gastric fundus  likely represents residual blood clot from prior hematemesis. 4. Additional ancillary findings as above without significant interval change compared recent  prior imaging.  Signed,  Criselda Peaches, MD  [facility]  [facility]   Electronically Signed   By: Jacqulynn Cadet M.D.   On: 05/09/2013 12:04   Dg Chest Port 1 View  05/16/2013   CLINICAL DATA:  Shortness of breath  EXAM: PORTABLE CHEST - 1 VIEW  COMPARISON:  DG CHEST 1V PORT dated 05/09/2013  FINDINGS: Retained oral contrast noted over the abdomen, obscured by soft tissue artifact. Left subclavian approach central line remains in place with tip at the brachiocephalic/ SVC junction. Moderate enlargement of the cardiac silhouette is reidentified without evidence for edema. Lung volumes are extremely low with crowding of the bronchovascular markings and suboptimal visualization at the lung bases. No pleural effusion or focal pulmonary opacity.  IMPRESSION: Allowing for extreme hypoaeration, no focal acute findings. If symptoms persist, consider PA and lateral chest radiographs obtained at full inspiration when the patient is clinically able.  Cardiomegaly without evidence for edema.   Electronically Signed   By: Conchita Paris M.D.   On: 05/16/2013 17:39   Dg Chest Port 1 View  05/09/2013   CLINICAL DATA:  Extubated  EXAM: PORTABLE CHEST - 1 VIEW  COMPARISON:  Prior chest x-ray 05/08/2013  FINDINGS: Interval extubation. Left subclavian approach central venous catheter remains in unchanged position with the tip in the upper SVC. Inspiratory volumes are lower resulting in increased vascular crowding and bibasilar atelectasis. There may be slightly increased pulmonary vascular congestion on today's exam. Stable cardiac and mediastinal contours. Metallic coils in radiopaque sclerosed sent material in the left upper quadrant consistent with recent BRTO procedure. No acute osseous abnormality.  IMPRESSION: 1. Lower inspiratory volumes following extubation results in vascular crowding and increased bibasilar atelectasis. 2. Perhaps slightly increased pulmonary  vascular congestion. 3. Stable position of left subclavian central venous catheter.   Electronically Signed   By: Jacqulynn Cadet M.D.   On: 05/09/2013 08:18   Dg Chest Port 1 View  05/08/2013   CLINICAL DATA:  Intubation.  EXAM: PORTABLE CHEST - 1 VIEW  COMPARISON:  Chest x-ray 05/06/2013.  FINDINGS: Endotracheal tube left central line in stable position. Stable cardiomegaly and pulmonary venous congestion. Mild interstitial prominence and left-sided pleural effusion. Mild congestive heart failure cannot be excluded. Persistent mild bibasilar atelectasis. No pneumothorax.  IMPRESSION: 1. Line and tube positions stable. 2. Mild congestive heart failure with mild interstitial pulmonary edema and small left pleural effusion. 3. Persistent mild bibasilar atelectasis.   Electronically Signed   By: Marcello Moores  Register   On: 05/08/2013 07:38   Dg Chest Port 1 View  05/06/2013   CLINICAL DATA:  Intubation.  Central line placement  EXAM: PORTABLE CHEST - 1 VIEW  COMPARISON:  10/14/2009  FINDINGS: Endotracheal tube in satisfactory position. Left subclavian central venous catheter tip in the mid SVC. No pneumothorax.  Mild atelectasis in the left lung base. Negative for edema or effusion.  IMPRESSION: Endotracheal tube and central line in satisfactory position.  Mild left lower lobe atelectasis.   Electronically Signed   By: Franchot Gallo M.D.   On: 05/06/2013 17:16   Dg Abd Portable 1v  05/09/2013   CLINICAL DATA:  Enteric tube placement.  EXAM: PORTABLE ABDOMEN - 1 VIEW  COMPARISON:  CT ABD WO/W CM dated 05/09/2013  FINDINGS: Enteric tube is present with the tip in the gastric fundus. The  enteric tube is in good position. Cluster of gallstones noted in the right abdomen. Calcification is present at the gastroesophageal junction with embolization coils.  IMPRESSION: Nasogastric tube in good position.   Electronically Signed   By: Dereck Ligas M.D.   On: 05/09/2013 23:51   Dg Swallowing Func-speech  Pathology  05/14/2013   Katherene Ponto Deblois, CCC-SLP     05/14/2013 11:38 AM Objective Swallowing Evaluation: Modified Barium Swallowing Study   Patient Details  Name: Donavan Olm MRN: [id number] Date of Birth: 1934/04/13  Today's Date: 05/14/2013 Time: 1000-1030 SLP Time Calculation (min): 30 min  Past Medical History:  Past Medical History  Diagnosis Date  . Hypertension   . Chronic kidney disease   . Anemia    Past Surgical History:  Past Surgical History  Procedure Laterality Date  . Radiology with anesthesia N/A 05/07/2013    Procedure: RADIOLOGY WITH ANESTHESIA;  Surgeon: Jacqulynn Cadet, MD;  Location: [place];  Service: Radiology;   Laterality: N/A;   HPI:  79 yo with alcoholic cirrhosis and varices who presented to [facility]  with hematemesis due to variceal bleed. Underwent EGD and banding  of esophageal varices x4 but had further hematemesis presumed to  be due to gastric varices. Intubated 5/6 to 5/8. Transferred to  [facility], underwent Balloon-occluded retrograde transvenous  obliteration of gastric varices performed by IR.      Assessment / Plan / Recommendation Clinical Impression  Dysphagia Diagnosis: Moderate oral phase dysphagia;Mild oral  phase dysphagia;Moderate pharyngeal phase dysphagia Clinical impression: Pt presents with a mild to moderate oral and  oropharyngeal dysphagia, improving throughout test with reeapted  trials. Pt with dry mucosa, likely contributing to residuals and  decreased sensation. Oral phase characterized by pumping  mechanism with piecemeal transit of boluses to pharynx and mild  lingual residuals. Swallow response is delayed with resulting  silent aspiration before the swallow with thin liquids. Nectar  thick liquids did not penetrate the airway despite pooling in  pyriforms. Base of tongue and epiglottic delfection and laryngeal  elevation are all mildly weak, leaving moderate vallecular  residuals that are transited with a spontaneous second swallow,  except with mechanical soft  (severe residue). Recomend pt  initiate a dys 1 (puree ) diet and nectar thick liquids,  following solids with liquids. Pts solids can be upgraded at  bedside. May need repeat testing prior to upgrade to thin given  silent aspiration.     Treatment Recommendation  Therapy as outlined in treatment plan below    Diet Recommendation Dysphagia 1 (Puree);Nectar-thick liquid   Liquid Administration via: Cup;Straw Medication Administration: Whole meds with liquid Supervision: Patient able to self feed;Full supervision/cueing  for compensatory strategies Compensations: Slow rate;Small sips/bites;Follow solids with  liquid;Multiple dry swallows after each bite/sip Postural Changes and/or Swallow Maneuvers: Out of bed for  meals;Seated upright 90 degrees    Other  Recommendations Oral Care Recommendations: Oral care BID   Follow Up Recommendations  Inpatient Rehab    Frequency and Duration min 2x/week  1 week   Pertinent Vitals/Pain NA    SLP Swallow Goals     General HPI: 79 yo with alcoholic cirrhosis and varices who  presented to [facility] with hematemesis due to variceal bleed.  Underwent EGD and banding of esophageal varices x4 but had  further hematemesis presumed to be due to gastric varices.  Intubated 5/6 to 5/8. Transferred to [facility], underwent  Balloon-occluded retrograde transvenous obliteration of gastric  varices performed by IR.  Type of  Study: Modified Barium Swallowing Study Reason for Referral: Objectively evaluate swallowing function Previous Swallow Assessment: none Diet Prior to this Study: NPO (large NG) Temperature Spikes Noted: No Respiratory Status: Room air History of Recent Intubation: Yes Length of Intubations (days): 3 days Date extubated: 05/08/13 Behavior/Cognition: Alert;Cooperative;Requires cueing Oral Cavity - Dentition: Adequate natural dentition Oral Motor / Sensory Function: Within functional limits Self-Feeding Abilities: Able to feed self Patient Positioning: Upright in chair Baseline Vocal  Quality: Hoarse;Low vocal intensity Volitional Cough: Strong Volitional Swallow: Able to elicit Anatomy: Within functional limits Pharyngeal Secretions: Not observed secondary MBS (pt cleared  secretions well when NG pulled)    Reason for Referral Objectively evaluate swallowing function   Oral Phase Oral Preparation/Oral Phase Oral Phase: Impaired Oral - Nectar Oral - Nectar Cup: Lingual pumping;Reduced posterior  propulsion;Lingual/palatal residue;Delayed oral transit Oral - Nectar Straw: Lingual pumping;Reduced posterior  propulsion;Lingual/palatal residue;Delayed oral transit Oral - Thin Oral - Thin Cup: Lingual pumping;Reduced posterior  propulsion;Lingual/palatal residue;Delayed oral transit Oral - Solids Oral - Puree: Lingual pumping;Reduced posterior  propulsion;Lingual/palatal residue;Delayed oral transit Oral - Mechanical Soft: Lingual pumping;Reduced posterior  propulsion;Lingual/palatal residue;Delayed oral transit;Impaired  mastication   Pharyngeal Phase Pharyngeal Phase Pharyngeal Phase: Impaired Pharyngeal - Nectar Pharyngeal - Nectar Cup: Delayed swallow initiation;Premature  spillage to pyriform sinuses;Reduced epiglottic inversion;Reduced  tongue base retraction;Reduced laryngeal elevation;Pharyngeal  residue - valleculae;Pharyngeal residue - pyriform sinuses Pharyngeal - Nectar Straw: Delayed swallow initiation;Premature  spillage to pyriform sinuses;Reduced epiglottic inversion;Reduced  tongue base retraction;Reduced laryngeal elevation;Pharyngeal  residue - valleculae;Pharyngeal residue - pyriform sinuses Pharyngeal - Thin Pharyngeal - Thin Cup: Delayed swallow initiation;Premature  spillage to pyriform sinuses;Reduced epiglottic inversion;Reduced  tongue base retraction;Reduced laryngeal elevation;Pharyngeal  residue - valleculae;Pharyngeal residue - pyriform  sinuses;Penetration/Aspiration before  swallow;Penetration/Aspiration during swallow;Trace aspiration Penetration/Aspiration details  (thin cup): Material enters  airway, passes BELOW cords without attempt by patient to eject  out (silent aspiration);Material enters airway, CONTACTS cords  then ejected out;Material enters airway, CONTACTS cords and not  ejected out;Material does not enter airway Pharyngeal - Solids Pharyngeal - Puree: Delayed swallow initiation;Premature spillage  to pyriform sinuses;Reduced epiglottic inversion;Reduced tongue  base retraction;Reduced laryngeal elevation;Pharyngeal residue -  valleculae;Pharyngeal residue - pyriform sinuses Pharyngeal - Mechanical Soft: Delayed swallow  initiation;Premature spillage to pyriform sinuses;Reduced  epiglottic inversion;Reduced tongue base retraction;Reduced  laryngeal elevation;Pharyngeal residue - valleculae;Pharyngeal  residue - pyriform sinuses Pharyngeal - Pill: Delayed swallow initiation;Premature spillage  to pyriform sinuses;Reduced epiglottic inversion;Reduced tongue  base retraction;Reduced laryngeal elevation;Pharyngeal residue -  valleculae;Pharyngeal residue - pyriform sinuses  Cervical Esophageal Phase    GO             Herbie Baltimore, MA CCC-SLP [id number]  Katherene Ponto Deblois 05/14/2013, 11:37 AM    Ir Ileana Ladd Venous Not [place] [address]  05/08/2013   CLINICAL DATA:  79 year old male with cirrhosis complicated by bleeding esophageal and gastric varices. Patient underwent recent EGD and banding of varices x4 but developed recurrent large volume spontaneous hemoptysis requiring transfer to [facility]. Ultimately, the patient was intubated for altered mental status likely due to hepatic encephalopathy. Patient is a candidate for balloon-occluded retrograde trans venous obliteration (BRTO) of his large gastric varices.  EXAM: ADDITIONAL ARTERIOGRAPHY; IR EMBO VENOUS NOT HEMORR HEMANG INC GUIDE ROADMAPPING; ARTERIOGRAPHY; IR ULTRASOUND GUIDANCE VASC ACCESS RIGHT; LEFT RENAL VENOGRAPHY  Date: 05/08/2013  PROCEDURE: 1. Ultrasound-guided puncture of  the right common femoral vein 2. Catheterization of the left renal vein with left renal venogram 3. Catheterization of  the common trunk of the left adrenal pain and gastric renal shunt with venogram 4. Catheterization of the left inferior phrenic pain with venogram 5. Catheterization of the gastric renal shunt 6. Balloon occluded retrograde venogram of the-year renal shunt an gastric varix 7. Balloon occluded retrograde transvenous obliteration of gastric varix 8. Coil embolization of the proximal gastric renal shunt Interventional Radiologist:  Criselda Peaches, MD  ANESTHESIA/SEDATION: General anesthesia was provided by the anesthesiology service.  FLUOROSCOPY TIME:  15 min 42 seconds  CONTRAST:  61mL OMNIPAQUE IOHEXOL 300 MG/ML  SOLN  TECHNIQUE: Informed consent was obtained from the patient following explanation of the procedure, risks, benefits and alternatives. The patient understands, agrees and consents for the procedure. All questions were addressed. A time out was performed.  Maximal barrier sterile technique utilized including caps, mask, sterile gowns, sterile gloves, large sterile drape, hand hygiene, and Betadine skin prep.  The right groin was interrogated with ultrasound. The common femoral vein was found to be widely patent. There is an existing right common femoral central venous catheter. Local anesthesia was attained by infiltration with 1% lidocaine. A small dermatotomy was made. Under real-time sonographic guidance, the right common femoral vein was punctured with a 21 gauge micropuncture needle. An image was obtained and stored for the medical record. With the AV transitional 4 French micro sheath, an Amplatz wire was advanced into the IVC. A 10 French TIPS sheath was then advanced over the wire and into the IVC. A C2 Cobra catheter and Bentson wire were used to catheterize the left renal vein. A left renal venogram was performed. 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 No  significant reflux down the left gonadal vein.  A Rosen wire was advanced into the inferior pole renal vein. A cobra catheter was then removed. Its sheath was advanced into the renal vein. A DSA run was performed identifying the origin of the gastric renal shunt. An angled catheter was advanced coaxially through the TIPS sheath a glidewire used to select the left inferior phrenic vein. An inferior phrenic venogram was performed. No significant communication with the gastric varices.  The catheter was then brought back into the main body of the gastric renal shunt. At and Amplatz wire was advanced to the gastric renal shunt negative sheath advanced over the wire into the proximal shunt. An 11 mm fossa scientific standard balloon occluded catheter was advanced over the wire. The balloon was inflated and core against a web in the proximal gastric renal shunt. A balloon occluded retrograde venogram was performed of the gastric renal shunts. The gastric varix was successfully identified. The eat a fair its posterior gastric vein was also successfully identified. The balloon was deflated and contrast allow the washout. A high-flow Renegade micro catheter was then advanced over a fat 60 wire deep into the gastric renal shunt. A final test inflation of the occlusion balloon was performed. Contrast is nicely static within the gastric varix and CT renal shunt. No significant gait vessels requiring embolization.  Foam sclerosus was then mixed in the standard 3:2:1 ratio of air to sodium tetradecyl sulfate to lipiodol. Approximately 15 mm of foam sclerosant was injected completely filling the gastric varix,the gastro renal shunt and extending into the proximal aspect of the posterior gastric vein. This represents approximately 5.5 mL of STS.  The proximal aspect of the gastro renal shunt just distal to the occlusion balloon was then coil embolized with 0.035 detachable coils. Two 20 x 400 mm interlock coils  followed by a 15 x  150 and a 12 x 150 coil. Due to an emergent case the follow, the occlusion balloon was left inflated and she lesion balloon were stored right groin. The patient was returned to the ICU in stable condition. He will be brought back to Interventional Radiology in the morning for balloon deflation under fluoroscopic visualization.  IMPRESSION: 1. Technically successful balloon occluded retrograde trans venous obliteration of gastric varices. 2. Coil embolization of the proximal aspect of the efferent gastrorenal shunt. 3. Occlusion balloon left in place to secure the foam sclerosant overnight. The patient will return to interventional radiology in the morning or balloon takedown under fluoroscopic visualization. Patient will require continued surveillance for recurrence of high risk esophageal varices. I will communicate with the patient's primary Gastroenterologist to ensure follow-up.  Signed,  Criselda Peaches, MD  [facility]  [facility]   Electronically Signed   By: Jacqulynn Cadet M.D.   On: 05/08/2013 17:28    Microbiology: Recent Results (from the past 240 hour(s))  CLOSTRIDIUM DIFFICILE BY PCR     Status: None   Collection Time    05/13/13  1:18 PM      Result Value Ref Range Status   C difficile by pcr NEGATIVE  NEGATIVE Final  BODY FLUID CULTURE     Status: None   Collection Time    05/18/13 10:42 AM      Result Value Ref Range Status   Specimen Description FLUID ABDOMEN ASCITIC   Final   Special Requests NONE   Final   Gram Stain     Final   Value: NO WBC SEEN     NO ORGANISMS SEEN     Performed at [facility]   Culture     Final   Value: NO GROWTH 1 DAY     Performed at [facility]   Report Status PENDING   Incomplete     Labs:  Sample type   ARTERIAL DRAW                 Sample type   Delivery systems   VENTILATOR                 Delivery systems   FIO2   0.60                 FIO2   Mode   PRESSURE  REGULATED VOLUME CONTROL                 Mode   VT   600                 VT   Peep/cpap   5.0                 Peep/cpap   pH, Arterial   7.349                 pH, Arterial   pCO2 arterial   31.8                 pCO2 arterial   pO2, Arterial   238.0                 pO2, Arterial   Bicarbonate   17.1                 Bicarbonate   TCO2   18.0  TCO2   Acid-base deficit   7.5                 Acid-base deficit   O2 Saturation   99.8                 O2 Saturation   Patient temperature   98.6                 Patient temperature   Collection site   RIGHT RADIAL                 Collection site   Allens test (pass/fail)   PASS                 Allens test (pass/fail)   CHEM PROFILE    Sodium  143 142 141 141 139 142 144 148 149  145 143 138  139    Sodium   Potassium  4.9 4.4 4.3 4.3 3.4 3.7 3.5 3.7 3.9  4.8 4.4 4.2  4.2    Potassium   Chloride  111 114 114 114 111 114 117 119 120  117 114 109  109    Chloride   CO2  19 18 15 17 18 16 17 18 17  16 19 18  20     CO2   BUN  57 53 34 23 17 17 19 22 23  23 21 22  23     BUN   Creatinine, Ser  1.68 1.63 1.13 1.07 0.95 0.94 1.03 1.04 1.03  1.02 1.07 1.03  1.17    Creatinine, Ser   Calcium  8.2 7.5 7.3 7.7 7.8 8.4 8.2 8.9 9.1  8.6 8.2 8.5  8.4    Calcium   GFR calc non Af Amer  38 39 61 65 78 79 68 67 68  69 65 68  58    GFR calc non Af Amer   GFR calc Af Amer  44 45 70 75 >90 >90 79 78 79  80 75 79  68    GFR calc Af Amer   Glucose, Bld  144 163 161 122 120 125 127 156 128  78 85 92  134    Glucose, Bld   Phosphorus  4.2                  Phosphorus   Magnesium  1.5                  Magnesium   Alkaline Phosphatase  214 171 152 173 161  199  337          Alkaline Phosphatase   Albumin  2.1 2.0 1.9 2.0 1.9  1.8  2.0          Albumin   AST  52 39 58 56 35  33  59          AST   ALT  17 17 20 20 15  12  22           ALT   Total Protein  5.6 5.3 5.0 5.6 5.6  5.7  6.3          Total Protein   Ammonia        33  37    25      Ammonia   Total  Bilirubin  3.5 1.5 1.7 2.4 3.2  2.7  2.3          Total Bilirubin   LIPID PROFILE  Triglycerides     91               Triglycerides   CBC    WBC  12.1 10.9 8.0 10.4 10.1 10.6 11.4 12.0 13.3  12.5 9.7 12.8  10.0    WBC   RBC  2.48 2.73 2.98 3.19 3.14 3.16 3.15 3.16 3.23  3.06 2.86 3.26  3.55    RBC   Hemoglobin  6.9 7.9 8.8 9.3 9.2 9.3 9.0 9.2 9.3  8.9 8.2 9.5  10.2    Hemoglobin   HCT  21.1 23.5 26.1 28.4 28.1 28.5 28.1 28.6 29.4  28.4 26.2 29.7  32.6    HCT   MCV  85.1 86.1 87.6 89.0 89.5 90.2 89.2 90.5 91.0  92.8 91.6 91.1  91.8    MCV   MCH  27.8 28.9 29.5 29.2 29.3 29.4 28.6 29.1 28.8  29.1 28.7 29.1  28.7    MCH   MCHC  32.7 33.6 33.7 32.7 32.7 32.6 32.0 32.2 31.6  31.3 31.3 32.0  31.3    MCHC   RDW  18.0 17.5 17.0 19.3 19.9 19.9 20.0 20.5 21.0  21.1 20.5 20.3  20.4    RDW   Platelets  125 133 72 86 67 102 128 133 165  155 141 133  158    Platelets   PROTIME W/ INR    Prothrombin Time  22.1 18.4 17.7 16.9 18.2  18.4  18.3          Prothrombin Time   INR  2.00 1.58 1.50 1.41 1.55  1.58  1.57          INR   PTT    aPTT  32 34     36            aPTT   DIABETES    Glucose, Bld  144 163 161 122 120 125 127 156 128  78 85 92  134    Glucose, Bld   STOOL TESTS    C difficile by pcr          NEGATIVE          C difficile by pcr   CHEMISTRY, FLUID    Glucose, Fluid               113     Glucose, Fluid   Fluid Type-FGLU               FLUID     Fluid Type-FGLU   Fluid Type-FLDH               FLUID     Fluid Type-FLDH   LD, Fluid               52     LD, Fluid   Total protein, fluid               0.9     Total protein, fluid   Fluid Type-FTP               FLUID     Fluid Type-FTP   Amylase, peritoneal fluid               21    Signed:  Delfina Redwood, MD [facility] 05/20/2013, 11:09 AM

## 2013-05-21 LAB — BODY FLUID CULTURE
Culture: NO GROWTH
Gram Stain: NONE SEEN

## 2013-06-04 ENCOUNTER — Other Ambulatory Visit: Payer: Self-pay | Admitting: Emergency Medicine

## 2013-06-04 ENCOUNTER — Other Ambulatory Visit: Payer: Self-pay | Admitting: Interventional Radiology

## 2013-06-04 DIAGNOSIS — I864 Gastric varices: Secondary | ICD-10-CM

## 2013-06-17 ENCOUNTER — Encounter: Payer: Self-pay | Admitting: Emergency Medicine

## 2013-06-26 NOTE — OR Nursing (Signed)
Addendum to scope page and wound classification

## 2013-06-28 DIAGNOSIS — E039 Hypothyroidism, unspecified: Secondary | ICD-10-CM

## 2013-06-28 DIAGNOSIS — I8500 Esophageal varices without bleeding: Secondary | ICD-10-CM | POA: Insufficient documentation

## 2013-06-28 DIAGNOSIS — M109 Gout, unspecified: Secondary | ICD-10-CM | POA: Insufficient documentation

## 2013-06-28 DIAGNOSIS — I864 Gastric varices: Secondary | ICD-10-CM

## 2013-06-28 HISTORY — DX: Hypothyroidism, unspecified: E03.9

## 2013-08-03 ENCOUNTER — Ambulatory Visit: Payer: Self-pay | Admitting: Oncology

## 2013-08-03 LAB — IRON AND TIBC
IRON SATURATION: 35 %
Iron Bind.Cap.(Total): 286 ug/dL (ref 250–450)
Iron: 101 ug/dL (ref 65–175)
Unbound Iron-Bind.Cap.: 185 ug/dL

## 2013-08-03 LAB — CBC CANCER CENTER
BASOS ABS: 0.1 x10 3/mm (ref 0.0–0.1)
Basophil %: 1.4 %
EOS PCT: 5.9 %
Eosinophil #: 0.2 x10 3/mm (ref 0.0–0.7)
HCT: 37.3 % — ABNORMAL LOW (ref 40.0–52.0)
HGB: 12.2 g/dL — AB (ref 13.0–18.0)
Lymphocyte #: 1.1 x10 3/mm (ref 1.0–3.6)
Lymphocyte %: 29.8 %
MCH: 32.1 pg (ref 26.0–34.0)
MCHC: 32.8 g/dL (ref 32.0–36.0)
MCV: 98 fL (ref 80–100)
MONO ABS: 0.6 x10 3/mm (ref 0.2–1.0)
MONOS PCT: 14.7 %
Neutrophil #: 1.8 x10 3/mm (ref 1.4–6.5)
Neutrophil %: 48.2 %
Platelet: 107 x10 3/mm — ABNORMAL LOW (ref 150–440)
RBC: 3.8 10*6/uL — AB (ref 4.40–5.90)
RDW: 22.9 % — AB (ref 11.5–14.5)
WBC: 3.8 x10 3/mm (ref 3.8–10.6)

## 2013-08-03 LAB — FERRITIN: Ferritin (ARMC): 29 ng/mL (ref 8–388)

## 2013-08-25 ENCOUNTER — Encounter: Payer: Self-pay | Admitting: Radiology

## 2013-09-01 ENCOUNTER — Ambulatory Visit: Payer: Self-pay | Admitting: Oncology

## 2013-11-10 DIAGNOSIS — R002 Palpitations: Secondary | ICD-10-CM | POA: Insufficient documentation

## 2014-02-01 ENCOUNTER — Ambulatory Visit: Payer: Self-pay | Admitting: Oncology

## 2014-02-01 LAB — RETICULOCYTES
ABSOLUTE RETIC COUNT: 0.0652 10*6/uL (ref 0.019–0.186)
Reticulocyte: 1.52 % (ref 0.4–3.1)

## 2014-02-01 LAB — IRON AND TIBC
Iron Bind.Cap.(Total): 316 ug/dL (ref 250–450)
Iron Saturation: 56 %
Iron: 176 ug/dL — ABNORMAL HIGH (ref 65–175)
UNBOUND IRON-BIND. CAP.: 140 ug/dL

## 2014-02-01 LAB — FERRITIN: Ferritin (ARMC): 107 ng/mL (ref 8–388)

## 2014-03-02 ENCOUNTER — Ambulatory Visit
Admit: 2014-03-02 | Disposition: A | Discharge status: Home / Self Care | Payer: Self-pay | Attending: Oncology | Admitting: Oncology

## 2014-04-23 NOTE — Discharge Summary (Signed)
PATIENT NAME:  Joseph Hawkins, Joseph Hawkins MR#:  [id number] DATE OF BIRTH:  1934-12-31  DATE OF ADMISSION:  02/02/2012 DATE OF DISCHARGE:  02/05/2012  ADMITTING PHYSICIAN: Dr. Laurin Coder.    DISCHARGING PHYSICIAN: Gladstone Lighter, MD.   PRIMARY CARE PHYSICIAN: Dr. Grayland Ormond from hematology.    CONSULTATIONS IN THE HOSPITAL: GI consultation by Dr. Gustavo Lah.   DISCHARGE DIAGNOSES:  1. Upper gastrointestinal bleed secondary to esophageal and gastric varices.  2. Acute on chronic anemia, status post 1 unit of packed red blood cell transfusion this admission.  3. Alcoholic liver cirrhosis.  4. Portal hypertension.  5. Gout.  6. Myelodysplastic syndrome.  7. Hypothyroidism.  8. Acute renal failure on admission.   DISCHARGE HOME MEDICATIONS:  1. Sucralfate 1 g tablet 4 times a day.  2. Levothyroxine 75 mg p.o. daily.  3. Allopurinol 100 mg p.o. daily.  4. Nadolol 20 mg every day at 5 p.m. and hold if the heart rate is less than 50.  5. Protonix 40 mg p.o. b.i.d.   DISCHARGE DIET: Low-residue diet.   DISCHARGE ACTIVITY: As tolerated.    FOLLOWUP INSTRUCTIONS:  1. Follow up with Dr. Gustavo Lah in 1 week.  2. PCP followup in 2 to 3 weeks.  3. Avoid aspirin and nonsteroidal anti-inflammatory drugs.   ESOPHAGOGASTRODUODENOSCOPY: Done on 02/04/2012 by Dr. Gustavo Lah showing moderate portal hypertensive gastropathy in the gastric body and gastric antrum. Nonbleeding small erosion seen in gastric antrum. No active bleeding throughout, but there are type 2 gastroesophageal varices present. The largest were 7 mm in diameter.   LABS AND IMAGING STUDIES: Hemoglobin at the time of discharge was 8.4 after 1 unit packed RBC transfusion, WBC 5.8, platelet count 104.   Sodium 143, potassium 4.6, chloride 111, bicarb 23, BUN 46, creatinine 1.4, glucose 107 and calcium of 8.2.   ALT 24, AST 29, alk phos 97, bilirubin 0.8 and albumin 2.6.   INR 1.3.   BRIEF HOSPITAL COURSE: The patient is a 80 year old pleasant  gentleman with known history of prior alcohol abuse with liver cirrhosis, portal hypertension and esophageal varices. He presented to the hospital secondary to worsening melena at home and also symptomatic anemia.  1. Upper GI bleed secondary to gastroesophageal varices. He was started on IV Protonix and octreotide drip and seen by GI in consultation and underwent an upper GI endoscopy. Postendoscopy, he was started on liquid diet. His Protonix was changed to b.i.d. and was also placed on sucralfate. Because of his portal hypertensive gastropathy and variceal findings on upper GI endoscopy, Dr. Gustavo Lah has recommended that the patient would benefit from TIPS procedure as an outpatient and he said he would refer him to [facility] once he sees him in the clinic. The patient did get 1 unit of packed RBC transfusion for a drop in his hemoglobin to 6.4 and being symptomatic at that point. He did receive IV fluids, and his diet was advanced prior to discharge. He was also started on low-dose nadolol as he could not tolerate high dose due to being bradycardic. His other home medications were being continued. He was advised to stay away from aspirin and nonsteroidal anti-inflammatory drugs. His course has been otherwise uneventful in the hospital.   DISCHARGE CONDITION: Stable.   DISCHARGE DISPOSITION: Home.   TIME SPENT ON DISCHARGE: 40 minutes.     ____________________________ Gladstone Lighter, MD rk:gb D: 02/06/2012 15:59:55 ET T: 02/06/2012 23:42:35 ET JOB#: [id number]  cc: Gladstone Lighter, MD, <Dictator> Lollie Sails, MD Gladstone Lighter MD ELECTRONICALLY SIGNED 02/07/2012  19:48 

## 2014-04-23 NOTE — Consult Note (Signed)
PATIENT NAME:  Joseph Hawkins, Joseph Hawkins MR#:  [id number] DATE OF BIRTH:  02/05/1934  DATE OF CONSULTATION:  02/02/2012  REFERRING PHYSICIAN:   CONSULTING PHYSICIAN:  Lollie Sails, MD  PRIMARY CARE PHYSICIAN:  Dr. Danise Mina.   REASON FOR CONSULTATION:  Melena and anemia.   HISTORY OF PRESENT ILLNESS:  The patient is a very pleasant 80 year old [place] American male with whom I am familiar.  I have seen him on multiple occasions in the outpatient setting for follow-up on his liver disease.  He does have a history of esophageal as well as gastric varices and has had several episodes of upper GI bleeding.  I did his last EGD on 10/05/2011 for complaint of some black stools.  At that time he had grade 2 esophageal varices.  These were deep to the mucosa and showed no evidence of recent bleeding or red whale marks.  He also had type 2 gastric varices extending along the fundus.  These also showed no evidence of recent bleeding.  These were actually larger than the varices noted in the distal esophagus.  However, he did show multiple angiectasias with no bleeding in the gastric antrum.  There was no actively bleeding lesion noted at that time.  The antrum does show evidence of previous history of peptic ulcer disease.  He was continued on propranolol as a prophylaxis for variceal bleeding and Protonix daily.  He has undergone esophageal banding in the past once several years ago at [facility].  I have been hesitant to repeat banding as number 1, there is no evidence of bleeding esophageal varices or has not been previously and the second is that banding esophageal varices will often exacerbate problems with gastric varices of which he has multiple large gastric varices.  Gastric varices are not amenable to banding.  Today he presented to the Emergency Room after seeing a black stool this morning.  He has not had any black stools previously over the past several days or months since his last episode prior to October.   He has not seen a repeat black stool since this morning.  He denies any nausea, vomiting or abdominal pain.  There is no heartburn or dysphagia.  He has been getting some right upper quadrant discomfort when he bends over, but this does not occur otherwise.  He has a bowel movement every day.  He states that his appetite was decreased over the past couple of days.  He takes no NSAIDs.  In review, he had a colonoscopy done on 08/22/2009 for anemia at that time showing some small angiectasias in the cecum as well as some diverticulosis.  There was evidence of rectal varices.    PAST MEDICAL HISTORY:  The patient has a history of some amount of bone marrow suppression due to his liver disease, however has also been following with Dr. Grayland Ormond at [facility] for [place].   MEDICATIONS:  Allopurinol 300 mg a day, pantoprazole 40 mg one twice a day, propranolol 20 mg one twice a day, Carafate 1 gram by mouth 4 times daily and levothyroxine 75 mcg once a day.   PHYSICAL EXAMINATION: VITAL SIGNS:  Temperature is 98.2, pulse 79, respirations 18, blood pressure 114/63, pulse ox 98%.  GENERAL:  He is a well-appearing 80 year old African American male in no acute distress.  HEENT:  Normocephalic, atraumatic.  Eyes are anicteric.  Nose, septum midline.  No lesions.  Oropharynx, no lesions.  NECK:  Supple.  No JVD.  HEART:  Regular  rate and rhythm.  LUNGS:  Bilaterally clear to auscultation.  ABDOMEN:  Soft, nontender, nondistended.  Bowel sounds positive, normoactive.  ANORECTAL:  Deferred.  EXTREMITIES:  No clubbing, cyanosis or edema.  NEUROLOGICAL:  Cranial nerves II-XII grossly intact.  Muscle strength bilaterally equal and symmetric, V/V.  DTRs bilaterally equal and symmetric.   LABORATORY, DIAGNOSTIC, AND RADIOLOGICAL DATA:  Glucose of 121, BUN 49, creatinine 1.32, sodium 144, potassium 4.4, chloride 112, bicarb 25, calcium 9.2.  Hepatic profile showing a total protein of 7.2, albumin 3.2, total  bilirubin 0.8, alk phos 128, AST 43, ALT 32.  Cardiac enzymes x 1 negative/normal.  Hemogram showing a white count of 8.3, H and H  8.1/25.8, platelet count of 153.  MCV is 83.  His Pro Time was 16.2.  INR of 1.3.  He has been crossmatched.  There is no imaging studies.   ASSESSMENT AND PLAN:  Black stool, one episode this morning.  This is in the setting of known history of esophageal varices as well as several GI bleeds in the past.  The patient does have a history of esophageal and gastric varices, please note above.  The patient has been taking propranolol as an outpatient for variceal bleeding prophylaxis.  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  These can also at times bleed lending toward a black stool.  He has been hemodynamically stable.  He has currently been placed on IV PPI drip as well as octreotide drip.  Would continue these.  I do recommend an EGD.  I have discussed the risks, benefits, complications of EGD to include, but not limited to bleeding, infection, perforation and the risk of sedation and he wishes to proceed.  We will arrange this for Monday afternoon unless there is a clinical change in the interim.     ____________________________ Lollie Sails, MD mus:ea D: 02/02/2012 19:58:43 ET T: 02/02/2012 23:02:21 ET JOB#: [id number]  cc: Lollie Sails, MD, <Dictator> Lollie Sails MD ELECTRONICALLY SIGNED 02/08/2012 6:14

## 2014-04-23 NOTE — Consult Note (Signed)
Chief Complaint:   Subjective/Chief Complaint seen for melena.  One black bm early this am, not repeated.  HGB drop noted, patietn is being transfused.  Denies abdominal pain nausea or vomiting.   VITAL SIGNS/ANCILLARY NOTES: **Vital Signs.:   02-Feb-14 11:45   Vital Signs Type Q 4hr   Temperature Temperature (F) 98.1   Celsius 36.7   Temperature Source oral   Pulse Pulse 69   Respirations Respirations 16   Systolic BP Systolic BP 270   Diastolic BP (mmHg) Diastolic BP (mmHg) 68   Mean BP 86   Pulse Ox % Pulse Ox % 96   Pulse Ox Activity Level  At rest   Oxygen Delivery Room Air/ 21 %   Brief Assessment:   Cardiac Regular    Respiratory clear BS    Gastrointestinal details normal Soft  Nontender  Nondistended  No masses palpable  Bowel sounds normal   Lab Results: Hepatic:  02-Feb-14 01:18    Bilirubin, Total 0.8   Alkaline Phosphatase 97   SGPT (ALT) 24   SGOT (AST) 29   Total Protein, Serum  5.8   Albumin, Serum  2.6  Routine Chem:  02-Feb-14 01:18    Glucose, Serum  107   BUN  46   Creatinine (comp)  1.42   Sodium, Serum 143   Potassium, Serum 4.6   Chloride, Serum  111   CO2, Serum 23   Calcium (Total), Serum  8.2   Osmolality (calc) 297   eGFR (African American)  56   eGFR (Non-African American)  48 (eGFR values <47m/min/1.73 m2 may be an indication of chronic kidney disease (CKD). Calculated eGFR is useful in patients with stable renal function. The eGFR calculation will not be reliable in acutely ill patients when serum creatinine is changing rapidly. It is not useful in  patients on dialysis. The eGFR calculation may not be applicable to patients at the low and high extremes of body sizes, pregnant women, and vegetarians.)   Anion Gap 9   Magnesium, Serum  1.6 (1.8-2.4 THERAPEUTIC RANGE: 4-7 mg/dL TOXIC: > 10 mg/dL  -----------------------)  Routine Hem:  02-Feb-14 01:18    Hemoglobin (CBC)  6.4   WBC (CBC) 5.8   RBC (CBC)  2.48   Hematocrit  (CBC)  20.6   Platelet Count (CBC)  104   MCV 83   MCH 26.0   MCHC  31.2   RDW  20.9   Neutrophil % 55.2   Lymphocyte % 27.7   Monocyte % 13.9   Eosinophil % 2.7   Basophil % 0.5   Neutrophil # 3.2   Lymphocyte # 1.6   Monocyte # 0.8   Eosinophil # 0.2   Basophil # 0.0 (Result(s) reported on 03 Feb 2012 at 01:43AM.)    06:59    Hemoglobin (CBC)  7.6 (Result(s) reported on 03 Feb 2012 at 07:38AM.)    13:05    Hemoglobin (CBC)  7.3 (Result(s) reported on 03 Feb 2012 at 01:23PM.)   Assessment/Plan:  Assessment/Plan:   Assessment 1)  melena-hemodynalmically, on ppi drip and octreotide.  One bm since admission, now being transfused second unit of prbc.    Plan 1) EGD for tomorrow.  I have discussed the risks benefits and complications of egd to include not limited to bleeding infection perforation and sedation and he wishes to proceed.  Continue current.  Will recheck hgb after tfx complete.   Electronic Signatures: SLoistine Simas(MD)  (Signed 02-Feb-14 16:02)  Authored: Chief  Complaint, VITAL SIGNS/ANCILLARY NOTES, Brief Assessment, Lab Results, Assessment/Plan   Last Updated: 02-Feb-14 16:02 by Loistine Simas (MD)

## 2014-04-23 NOTE — Consult Note (Signed)
Chief Complaint:   Subjective/Chief Complaint seen for melena.  no bm since yesterday am.  no nausea or abdominal pain.   VITAL SIGNS/ANCILLARY NOTES: **Vital Signs.:   03-Feb-14 08:10   Vital Signs Type Routine   Temperature Temperature (F) 98.4   Celsius 36.8   Temperature Source oral   Pulse Pulse 67   Respirations Respirations 18   Systolic BP Systolic BP 347   Diastolic BP (mmHg) Diastolic BP (mmHg) 82   Mean BP 105   Pulse Ox % Pulse Ox % 97   Pulse Ox Activity Level  At rest   Oxygen Delivery Room Air/ 21 %   Brief Assessment:   Cardiac Regular    Respiratory clear BS    Gastrointestinal details normal Soft  Nontender  Nondistended  No masses palpable  Bowel sounds normal   Lab Results:  Routine Hem:  03-Feb-14 05:32    Hemoglobin (CBC)  7.7 (Result(s) reported on 04 Feb 2012 at 01:00AM.)    06:27    Hemoglobin (CBC)  8.4 (Result(s) reported on 04 Feb 2012 at 07:31AM.)   Assessment/Plan:  Assessment/Plan:   Assessment 1) melena-history of esophageal and gastric varices, portal htn.   2) cirrhosis-etoh related, no etoh for about 6 years.    Plan 1) egd today.  I have discussed the risks benefits and complications of egd to include not limited to bleeding infection perforation and sedation and he wishes to proceed. awaiting platelet count.   Electronic Signatures: Loistine Simas (MD)  (Signed 03-Feb-14 08:55)  Authored: Chief Complaint, VITAL SIGNS/ANCILLARY NOTES, Brief Assessment, Lab Results, Assessment/Plan   Last Updated: 03-Feb-14 08:55 by Loistine Simas (MD)

## 2014-04-23 NOTE — H&P (Signed)
PATIENT NAME:  Joseph Hawkins, Joseph Hawkins MR#:  [id number] DATE OF BIRTH:  May 04, 1934  DATE OF ADMISSION:  02/02/2012  PRIMARY GASTROENTEROLOGIST:  Dr. Gustavo Lah.   PRIMARY HEMATOLOGIST ONCOLOGIST:  Dr. Grayland Ormond.   HISTORY OF PRESENT ILLNESS:  The patient is a very nice 80 year old gentleman who has a significant history of previous alcohol abuse, now with cirrhosis of the liver.  He has history of esophageal varices and gastroesophageal gastropathy grade II.  On the past has had significant bleeding of these varices, but it has been years since that happened and at that moment he was seen at [facility].  He has history of hypothyroidism and gout.     The patient comes today with a history of melena that happened this morning, very large amount of stool, black, tarry.  The patient states that this is the first bowel movement with melena.  Yesterday he had a normal bowel movement and for the past week he has been having daily bowel movements without any melena on them.  Yesterday he did not feel very well.  He had very low appetite and he was feeling very weak.  He was very tachycardic and having some increased palpitations whenever he was walking, feeling really weak and unsteady.  He did not feel dizzy or lightheaded though.    The patient states that he has not had any abdominal pain and no other symptoms.  He was brought to the ER by his family and at this moment he is hemodynamically stable.  His blood pressure has been mildly elevated and his heart rate has been in the 70s.    Dr. Jasmine December, [facility] physician, was able to spoke with Dr. Gustavo Lah who is aware of the patient's situation.   REVIEW OF SYSTEMS:  CONSTITUTIONAL:  Denies any fever.  Positive fatigue and weakness for the past 24 hours.  No significant weight loss or weight gain.  EYES:  Denies any blurry vision, redness or inflammation.  EARS, NOSE, THROAT:  Denies any tinnitus, hearing loss or snoring, postnasal drip, sinus pain or difficulty swallowing.   RESPIRATORY:  No cough.  No wheezing.  No hemoptysis.  No dyspnea.  No painful respirations.  CARDIOVASCULAR:  No chest pain.  No orthopnea, no edema.  No arrhythmias.  Positive palpitations when he walks and this happened since yesterday.  No syncopal episodes.  No varicose veins on the lower extremities.  GASTROINTESTINAL:  No nausea, no vomiting.  No diarrhea.  No abdominal pain.  No hematemesis.  Positive melena.  No GERD.  No jaundice.  No significant red blood per rectum.  No constipation.  Normal bowel movements up until today.  No hemorrhoids that are active at this moment.  He does have history of hemorrhoids, but they are not tender.  GENITOURINARY:  No dysuria, hematuria or changes in frequency.  No prostatitis.  ENDOCRINOLOGY:  No polyuria, polydipsia, polyphagia.  Thyroid problems, the patient has hypothyroidism, but no cold or heat intolerance.  They have been well-controlled so far.  HEMATOLOGIC AND LYMPHATIC:  No easy bruising.  No swollen glands.  He has been diagnosed with MDS.  He has chronic anemia.  SKIN:  Without any rashes or petechiae.  No icterus.  MUSCULOSKELETAL:  No significant back pain, neck pain.  Positive gout, but no acute abnormalities at this moment.  NEUROLOGIC:  No numbness, tingling.  No CVA.  No TIA.  No ataxia.  PSYCHIATRIC:  No insomnia.  No depression, no anxiety.   PAST MEDICAL HISTORY: 1.  Cirrhosis of the liver.  2.  Previous alcohol abuse.  3.  Chronic anemia.  4.  Myelodysplastic syndrome.  5.  Hypothyroidism.  6.  Gout.  7.  Esophageal varices grade II and gastropathy level II.   ALLERGIES:  No known drug allergies.   PAST SURGICAL HISTORY:  Ganglion cyst removed from right wrist.   FAMILY HISTORY:  Positive for diabetes on his mom.  CVA in his father.  His mom actually died from an MI.  He denies any history of cancer in the family.   SOCIAL HISTORY:  He has never smoked.  He used to be a very heavy drinker.  He quit about 6 to 8 years ago  after a diagnosis of cirrhosis.  He is a retired Dealer.  He lives with his wife.  MEDICATIONS:  Allopurinol, levothyroxine and Propranolol.   PHYSICAL EXAMINATION: VITAL SIGNS:  Blood pressure 152/79, pulse 74, respiratory rate 18, temperature 97.6, oxygen saturation 100% on room air.  GENERAL:  The patient is alert, oriented x 3, no acute distress.  No respiratory distress.  Hemodynamically is stable at this moment.  He is comfortable on bed and he has a really good sense of humor.  HEENT:  His pupils are equal and reactive.  Extraocular movements are intact.  Mucosa are moist.  No icterus on his sclerae and his conjunctivae are actually pale.  No oropharyngeal exudates.  No oral lesions.  NECK:  Supple.  No JVD.  No thyromegaly.  No adenopathy.  No carotid bruits.  No rigidity.  No masses.  CARDIOVASCULAR:  Regular rate and rhythm.  No murmurs, rubs or gallops are appreciated at this moment.  No tenderness to palpation of anterior chest wall.  No displacement of PMI.  LUNGS:  Clear without any wheezing or crepitus.  No use of accessory muscles.  ABDOMEN:  Soft, nontender, nondistended.  No hepatosplenomegaly.  No masses.  Bowel sounds are positive.  No rebound.  No guarding.  RECTAL:  Deferred as the patient had one done by Dr. Jasmine December which is heme positive.  GENITAL:  Negative for external lesions.  EXTREMITIES:  No edema, no cyanosis, no clubbing.  Pulses +2.  Capillary refill about 3 seconds.  NEUROLOGIC:  Cranial nerves II-XII intact.  No focal abnormalities.  PSYCHIATRIC:  Mood is normal without any signs of agitation.  Alert, oriented x 3.  LYMPHATIC:  Negative for lymphadenopathy.  MUSCULOSKELETAL:  No significant joint abnormalities.  No joint effusions.  No signs of gout exacerbation.   LABORATORY RESULTS:  Glucose 121, BUN 49, creatinine 1.32.  His previous creatinine about three months ago was 1.23.  LFTs had a slight increase of AST at 43 and an albumin of 3.2.  Troponin is  negative.  Cardiac enzymes negative.  White count 8.3, hemoglobin 8.1.  Previous hemoglobin on December 27 was 11.6, platelets were 97 at that moment, today are 153.  INR 1.2 on September.  We are going to repeat one today.  EKG, normal sinus rhythm.  No ST depression or elevation.   ASSESSMENT AND PLAN:  The patient is a 80 year old gentleman with history of cirrhosis of the liver, myelodysplastic syndrome, hypothyroidism, gout, esophageal varices grade II, comes with melena this morning.   1.  Acute gastrointestinal bleeding.  The patient has significant melena, a large bowel movement.  He has a significant drop of his hemoglobin from 11.6 to 8.1.  He also has myelodysplastic syndrome which could account for this, but with a history of  melena this seems very likely that this is acute.  The patient was symptomatic yesterday, tachycardic when walking and very weak.  Today he is still having significant weakness.  He is hemodynamically stable.  His blood pressure is slightly elevated, but his heart rate is in the 70s.  We are going to monitor him on telemetry.  Hemoglobin is going to be checked every 6 hours.  If there is any tachycardia or hypotension he is going to be transferred to the CCU.  The patient is going to be on an octreotide drip and a Protonix drip.  I am going to just give him a clear liquid diet for now and when he is evaluated by Dr. Gustavo Lah he could put him on nothing by mouth or advance his diet if he feels like it is going to be okay.  He is hemodynamically stable, but at significant risk of bleeding with shock and cardiovascular collapse due to this.  The patient is going to be kept hospitalized and observed very closely.  His INR has not been back, but in the past it is 1.2.  Dr. Gustavo Lah aware of the patient.  2.  Cirrhosis of the liver.  At this moment his LFTs are stable.  He has not drank anything in the past 6 to 8 years.  3.  Anemia.  The patient has a drop of hemoglobin from 11.6 to  8.1.  As mentioned above, this is likely acute, but he does have myelodysplastic syndrome for what we are going to monitor that.  His platelets have been in the 90s, but today are 153.  He sees Dr. Grayland Ormond for his myelodysplastic syndrome.  He is not on any special therapy at this moment.  4.  Gout.  Continue allopurinol.  5.  Increased creatinine, increased BUN.  The BUN is likely to be elevated due to gastrointestinal bleeding.  His creatinine is elevated from 1.23 to 1.32, so just slightly elevated which could represent that the patient is dehydrated with a decreased intravascular volume due to his bleeding.  We are going to put him on IV fluids running at 125 an hour.  6.  Hypothyroidism.  Continue replacement with thyroid hormone.  7.  Other medical problems are stable.  Continue to monitor closely.   CODE STATUS:  THE PATIENT IS A FULL CODE.   TIME SPENT:  I spent about 45 minutes with this admission.    ____________________________ Manson Sink, MD rsg:ea D: 02/02/2012 14:57:55 ET T: 02/03/2012 05:34:51 ET JOB#: [id number]  cc: [place] Sink, MD, <Dictator> Nilesh Stegall America Brown MD ELECTRONICALLY SIGNED 02/12/2012 13:33

## 2014-04-23 NOTE — Consult Note (Signed)
Brief Consult Note: Diagnosis: melena.   Patient was seen by consultant.   Consult note dictated.   Recommend to proceed with surgery or procedure.   Comments: Please see full Gi consult [phone].  Patient well known to me.  Patient with h/o etoh related cirrhosis, but has been abstinent for about 6 years.  Several GIB in the past, probably related to portal gastropathy or gastric/colonic avms.  Patien does have a h/o both gastric and esophageal varices, had esophageal banding done once at [facility] about 1-2 years ago.  He takes beta blocker for variceal bleed prophylaxis.  Currently stable no repeat evidence of gi bleeding since single episode of black stool this am.  Will arrange for EGD monday.  I have discussed the risks benefits and complications of egd to include not limited to bleeding infection perforation and sedation and he wishes to proceed.  Continue iv ppi and octreotide as you are.  Following.  Electronic Signatures: Loistine Simas (MD)  (Signed 01-Feb-14 20:03)  Authored: Brief Consult Note   Last Updated: 01-Feb-14 20:03 by Loistine Simas (MD)

## 2014-04-24 NOTE — Discharge Summary (Signed)
PATIENT NAME:  Joseph Hawkins, Joseph Hawkins MR#:  [id number] DATE OF BIRTH:  [birth year]  DATE OF ADMISSION:  05/04/2013 DATE OF DISCHARGE:  05/05/2013  FINAL DIAGNOSES: 1.  Gastrointestinal bleeding secondary to esophageal and esophageal gastric varices.  2.  Acute blood loss anemia secondary to #1.  3.  Supraventricular tachycardia.  4.  Cirrhosis.  5.  History of remote alcohol abuse.  6.  Chronic anemia with myelodysplastic syndrome.  7.  Hypothyroidism.  8.  Gout.   HISTORY AND PHYSICAL:  Please see dictated admission history and physical.   PRINCIPAL PROCEDURE:  Upper GI endoscopy performed 05/05/2013.  This revealed evidence of grade III varices with stigmata of oozing blood in the lower third of the esophagus.  Four bands were successfully placed with complete eradication of these varices.  On further evaluation a type II gastroesophageal varices extending into the gastroesophageal fundus were noted, which also had stigmata of recent bleeding.   The patient was brought up to the floor after his endoscopy.  At that time his heart rate had been well controlled between 60 and 70.  He developed nausea with vomiting, with evidence of bright red blood, and drop in blood pressure, dropped to a low of 84/43.  Other than the nausea, however, the patient had no major symptoms including no chest pain, shortness of breath, or dizziness or palpitations.  He was moved to the Intensive Care Unit and put back on octreotide drip, pantoprazole drip.  GI was contacted and they were concerned that he was developing esophagogastric variceal bleeding, now with bleeding from the epigastric fundus, for which they recommended emergent TIPS.  His repeat hemoglobin was 6.4 and he was transfused 1 unit packed cells.  INR noted to be 1.9.   Gastroenterology contacted [facility], who agreed to accept the patient in transfer for emergent TIPS procedure, which is not performed at this facility.  The patient is to be transferred to that  facility by [facility] for evaluation and further treatment.   PHYSICAL EXAMINATION:  VITAL SIGNS:  At the time of this dictation, the patient's pulse is 64, blood pressure 90/50, saturations 96% on room air.  GENERAL:  On examination, the patient is in no distress.   CARDIAC:  Regular rate and rhythm.  LUNGS:  Clear.  ABDOMEN:  With quiet bowel sounds, without focal tenderness.   MEDICATIONS:  At the time of transfer include:  Octreotide drip, pantoprazole drip, normal saline at 60 mL/hour, Levothyroxine 0.75 mg by mouth daily, ondansetron 4 mg IV q. 4 hours as needed nausea, Allopurinol, amlodipine 10 mg daily, Nadolol 40 mg daily, spironolactone 25 mg daily, are all held at this time, due to hypotension and acute illness, and nothing by mouth status.    ____________________________ Adin Hector, MD bjk:ea D: 05/05/2013 62:37:62 ET T: 05/05/2013 23:19:44 ET JOB#: [id number]  cc: Tama High III, MD, <Dictator> Ramonita Lab MD ELECTRONICALLY SIGNED 05/06/2013 13:07

## 2014-04-24 NOTE — Consult Note (Signed)
PATIENT NAME:  Joseph Hawkins, Joseph Hawkins MR#:  [id number] DATE OF BIRTH:  1934-02-15  DATE OF CONSULTATION:  05/04/2013  REFERRING PHYSICIAN:  Ceasar Lund. Anselm Jungling, MD CONSULTING PHYSICIAN:  Joseph Meuse, NP PRIMARY GASTROENTEROLOGIST: Joseph Sails, MD PRIMARY CARE PHYSICIAN: Joseph Blade. Sarina Ser, MD  REASON FOR CONSULTATION: Melena.   HISTORY OF PRESENT ILLNESS: Joseph Hawkins is a 80 year old black male, who is a patient of Joseph Hawkins, with a history of alcoholic liver disease. He has a history of esophageal and gastric varices and multiple episodes of upper GI bleeding. His last EGD was by Joseph Hawkins 02/14/2012, where he was found to have grade 1 cardia and fundus varices and grade 2 esophageal varices in his lower esophagus, as well as portal hypertensive gastropathy, and he has a history of gastric AVMs and history of previous banding at [facility]. He has been on beta blocker at home. He also takes a PPI daily. He developed dark stools 2 days ago. He noticed 2 more dark stools this morning. He denies any abdominal pain. He did notice a little "soreness" in his epigastrium on palpation. He denies any nausea or vomiting. Denies any diarrhea or constipation. His weight has remained stable.   PAST MEDICAL AND SURGICAL HISTORY: Hypertension, hypothyroidism, alcoholic cirrhosis, chronic anemia, myelodysplastic syndrome for which he sees Joseph Hawkins, history of alcohol abuse (quit 6 years ago), gastric and esophageal varices, gastric AVMs.   MEDICATIONS: Allopurinol 300 mg daily, levothyroxine 75 mcg daily, nadolol 40 mg daily, pantoprazole 40 mg b.i.d., spironolactone 50 mg daily, sucralfate 1 gram b.i.d.   ALLERGIES: No known drug allergies.   FAMILY HISTORY: There is no known family history of colorectal carcinoma, liver or chronic GI problems.   SOCIAL HISTORY: He is married. He is a retired Dealer. He lost one son in an accident. He has 2 grown healthy children. He denies any illicit drug use  or tobacco use.   REVIEW OF SYSTEMS: See HPI.  CONSTITUTIONAL: He did have some weakness and fatigue.  CARDIOVASCULAR: He has had some hypertension and tachycardia.   Otherwise negative complete 12-point review of systems.   PHYSICAL EXAMINATION: VITAL SIGNS: Temperature 98.9, pulse 65, respirations 18, blood pressure 159/86, O2 sat 99% on room air.  GENERAL: He is a thin black male who is alert, oriented, pleasant, and cooperative. No acute distress.  HEENT: Sclerae clear, anicteric. Conjunctivae pink. Oropharynx pink and moist without any lesions.  NECK: Supple without any mass or thyromegaly.  CHEST: Heart regular rate and rhythm. Normal S1, S2. No murmurs, clicks, rubs, or gallops.  LUNGS: Clear to auscultation bilaterally.  ABDOMEN: Flat with positive bowel sounds x 4. No bruits auscultated. Abdomen is soft,  nontender, nondistended, without palpable hepatosplenomegaly or mass. No rebound tenderness or guarding.  EXTREMITIES: Without edema. No cyanosis.  SKIN: Warm and dry without any rash or jaundice.  MUSCULOSKELETAL: Good equal strength and movement bilaterally.  NEUROLOGIC: Grossly intact.  PSYCHIATRIC: He has normal mood and affect and is alert and cooperative.   LABORATORY STUDIES: Basic metabolic panel was normal, except for glucose of 104 and a BUN 25 and a CO2 of 20 yesterday. Alkaline phosphatase 347, AST 92, total bilirubin 2, serum albumin 2.8, otherwise normal LFTs. Troponin was negative. Hemoglobin was 8.8, down from 10.2 on admission, platelet count 113, white blood cell count 6.5.   IMPRESSION: Joseph Hawkins is a pleasant 80 year old black male with melena, history of gastroesophageal varices and gastric arteriovenous malformations. He has a history of  alcoholic cirrhosis. His hemoglobin was 10.2, down to 8.8. Will continue to monitor. No signs of acute variceal hemorrhage at this point, although he is at risk. He will need EGD with Joseph Hawkins as soon as possible for further  evaluation. I have discussed risks and benefits, which include but are not limited to bleeding, infection, perforation, drug reaction. He agrees with plan, and consent will be obtained.   PLAN: 1.  Agree with b.i.d. Protonix 40 mg IV.  2.  NPO after midnight.  3.  EGD tomorrow.  4.  Follow hemoglobin and hematocrit and transfuse if needed to keep hemoglobin above 7 grams.   Thank you for allowing [place] to participate in his care.   ____________________________ Joseph Meuse, NP klj:jcm D: 05/04/2013 21:47:47 ET T: 05/04/2013 22:04:38 ET JOB#: [id number]  cc: Joseph Meuse, NP, <Dictator> Joseph Sails, MD Joseph Meuse FNP ELECTRONICALLY SIGNED 05/12/2013 11:02

## 2014-04-24 NOTE — Consult Note (Signed)
Brief Consult Note: Diagnosis: Melena.   Patient was seen by consultant.   Comments: Mr. Joseph Hawkins is a pleasant 80 y/o black male with melena & hx of gastroesophageal varices & gastric AVMs.  Hx ETOH cirrhosis.  Hgb 10.2, will monitor.  No signs of acute variceal hemorrhage at this point.  EGD with Dr Allen Norris for further evaluation.  Discussed risks/benefits of procedure which include but are not limited to bleeding, infection, perforation & drug reaction.  Patient agrees with this plan & consent will be obtained.  Plan: 1) Agree with BID Protonix 40mg  IV 2)NPO after MN 3) EGD tomorrow 4) Follow H/H and transfuse if needed to keep Hgb above 7 grams Thanks for allowing [place] to participate in his care.  Please see full dictated note. #[id number].  Electronic Signatures: Andria Meuse (NP)  (Signed [phone] 21:47)  Authored: Brief Consult Note   Last Updated: 04-May-15 21:47 by Andria Meuse (NP)

## 2014-04-24 NOTE — Discharge Summary (Signed)
Dates of Admission and Diagnosis:  Date of Admission 04-May-2013   Admitting Diagnosis GI bleed   Final Diagnosis Upper GI bleed, esophageal varices, alcoholic cirrhosis, Hypertension, Hypothyroidism, Gout   Discharge Diagnosis 1 Variceal bleeding   2 Alcoholic cirrhosis   3 Hypertension   4 Hypothyroidism   5 Gout    Chief Complaint/History of Present Illness 80 year old male with alcoholic cirrhosis presented with epigastric pain, guiac + dark stools, anemia and tachycardia 120-130 bpm.   Allergies:  No Known Allergies:   Routine Chem:  05-May-15 06:51   Glucose, Serum 86  BUN  30  Creatinine (comp) 1.18  Sodium, Serum 141  Potassium, Serum 4.2  Chloride, Serum  109  CO2, Serum 23  Calcium (Total), Serum 9.0  Anion Gap 9  Osmolality (calc) 287  eGFR (African American) >60  eGFR (Non-African American)  59 (eGFR values <10m/min/1.73 m2 may be an indication of chronic kidney disease (CKD). Calculated eGFR is useful in patients with stable renal function. The eGFR calculation will not be reliable in acutely ill patients when serum creatinine is changing rapidly. It is not useful in  patients on dialysis. The eGFR calculation may not be applicable to patients at the low and high extremes of body sizes, pregnant women, and vegetarians.)  Routine Hem:  04-May-15 07:55   Hemoglobin (CBC)  10.2  Hematocrit (CBC)  32.5    20:24   Hemoglobin (CBC)  8.8 (Result(s) reported on 04 May 2013 at 09:15PM.)  05-May-15 06:51   WBC (CBC) 5.5  RBC (CBC)  3.20  Hemoglobin (CBC)  8.5  Hematocrit (CBC)  27.6  Platelet Count (CBC)  96  MCV 86  MCH 26.5  MCHC  30.7  RDW  20.5  Neutrophil % 70.0  Lymphocyte % 15.2  Monocyte % 12.9  Eosinophil % 0.9  Basophil % 1.0  Neutrophil # 3.9  Lymphocyte #  0.8  Monocyte # 0.7  Eosinophil # 0.0  Basophil # 0.1 (Result(s) reported on 05 May 2013 at 07:28AM.)   PERTINENT RADIOLOGY STUDIES: [place]    02-Apr-15 10:06, [place]  General [place]  [place] General [place]   REASON FOR EXAM:    complete  elevated transaminase  COMMENTS:       PROCEDURE: KUS - KUS ABDOMEN UPPER GENERAL  - Apr 02 2013 10:06AM     CLINICAL DATA:  Elevated transaminases.    EXAM:  ULTRASOUND ABDOMEN COMPLETE    COMPARISON:  [place] dated 02/07/2009; CT ABD-PEL WO/W CM  dated 01/31/2005    FINDINGS:  Gallbladder:  Multiple gallstones. Gallbladder wall thickness is 3.6 mm. Similar  finding noted on prior study. Negative Murphy sign. No  pericholecystic fluid.    Common bile duct:    Diameter: 4.5 mm    Liver:    Echo dense and heterogeneous liver. Fatty infiltration and/or  hepatocellular disease could present this fashion.    IVC:  No abnormality visualized.    Pancreas:    No focal abnormality identified.    Spleen:    Size and appearance within normal limits.    Right Kidney:    Length: 10.0 cm. Echogenicity within normal limits. No mass or  hydronephrosis visualized.  Left Kidney:    Length: 11.3 cm. Echogenicity within normal limits. No mass or  hydronephrosis visualized.    Abdominal aorta:    No aneurysm visualized.    Other findings:    None.     IMPRESSION:  1. Multiple  gallstones . Gallbladder wall is slightly thickened,  this is unchanged from prior exam of 02/07/2009. No pericholecystic  fluid collection, negative ultrasound Murphy's sign .  2. Heterogeneous echodense liver. Fatty infiltration and/or  hepatocellular disease could present in this fashion.      Electronically Signed    By: Marcello Moores  Register    On: 04/02/2013 10:26         Verified By: Osa Craver, M.D., MD  LabUnknown:  PACS Image    Pertinent Past History:  Pertinent Past History Alcoholic cirrhosis HTN Hypothyroidism Gout   Hospital Course:  Hospital Course Patient was admitted for GI bleed. He received i.v fluids and Pantoprazole. EGD on 05/05/13 revealed bleeding grade III esophageal  varices which were banded/completely eradicated. Patient improved clinically. No c/o abdominal pain. He tolerated liquid diet. H/H remained stable. Norvasc, Nadolol and Spironolactone were continued for HTN and ALD w/portal hypertension. Synthroid was continued for hypothyroidism. Allopurinol was continued for gout.   Condition on Discharge Stable   DISCHARGE INSTRUCTIONS HOME MEDS:  Medication Reconciliation: Patient's Home Medications at Discharge:    PRESCRIPTIONS: ELECTRONICALLY SUBMITTED   Physician's Instructions:  Diet Regular  Eat light for the first meal   Activity Limitations As tolerated   Return to Work Not Applicable   Time frame for Follow Up Appointment 1-2 weeks  [facility]   Time frame for Follow Up Appointment 1-2 weeks  Dr. Gilford Rile   Electronic Signatures: Glendon Axe (MD)  (Signed [phone] 18:42)  Authored: ADMISSION DATE AND DIAGNOSIS, CHIEF COMPLAINT/HPI, Allergies, PERTINENT LABS, PERTINENT RADIOLOGY STUDIES, [place], PATIENT INSTRUCTIONS   Last Updated: 05-May-15 18:42 by Glendon Axe (MD)

## 2014-04-24 NOTE — H&P (Signed)
PATIENT NAME:  Joseph Hawkins, Joseph Hawkins MR#:  [id number] DATE OF BIRTH:  [birth year]  DATE OF ADMISSION:  05/04/2013  PRIMARY CARE PHYSICIAN: Dr. Lisette Grinder.   PRIMARY GASTROENTEROLOGIST: Dr.  Gustavo Lah  REFERRING PHYSICIAN: Dr. Esperanza Heir.   CHIEF COMPLAINT: Dark stool.   HISTORY OF PRESENT ILLNESS: The patient is a 80 year old male with a past medical history of cirrhosis of liver, previous alcohol abuse, stopped since many years, chronic anemia, had grade 2 esophageal varices,  myelodysplastic syndrome, hypothyroidism, hypertension, who is in his regular state of health, but last night started feeling some soreness in his epigastric region. There was no constipation or diarrhea. He does not feel any nausea but felt a little dizzy with that and today morning when he had a bowel movement he noticed its dark color than his usual and so decided to come to the Emergency Room as he has a history of GI bleeds and varices in the past. In the ER, he was found having his tachycardia rate up to 140, and given injection of Cardizem 10 mg IV push by ER physician, which slowed down his heart rate of 200 , admission to hospitalist team because of his significant past history for further management.   REVIEW OF SYSTEMS:  CONSTITUTIONAL: Negative for fever, fatigue, weakness, pain or weight loss.  EYES: No blurring, double vision, discharge or redness.  EARS, NOSE, THROAT: No tinnitus, ear pain or hearing loss.  RESPIRATORY: No cough, wheezing, hemoptysis, or shortness of breath.  CARDIOVASCULAR: No chest pain, orthopnea, edema, arrhythmia or palpitations.  GASTROINTESTINAL: No nausea, vomiting, diarrhea, but has some mild soreness in epigastric area.  GENITOURINARY: No dysuria, hematuria, or increased frequency.  ENDOCRINE: No heat or cold intolerance. No excessive sweating or cold  MUSCULOSKELETAL: No pain or swelling in the joints.  NEUROLOGICAL: No numbness, weakness, tremor or vertigo.   PSYCHIATRIC: No  anxiety, insomnia, bipolar disorder.   PAST MEDICAL HISTORY: 1. Cirrhosis of liver. 2. Previous alcohol abuse.  3. Chronic anemia.  4. Myelodysplastic syndrome.  5. Hypothyroidism.  6. Gout.  7. Esophageal varices grade 2.  PAST SURGICAL HISTORY: Ganglion cyst removal  surgery from his right wrist.   FAMILY HISTORY: Positive for diabetes in his mother, cerebrovascular accident in father, mother actually died of myocardial infarction and his daughter recently diagnosed with breast cancer.   SOCIAL HISTORY: He never smoked. He used to be a heavy drinker, but quit about like 8 to 9 years ago after the diagnosis of liver cirrhosis. 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   HOME MEDICATIONS: Still need to be confirmed by pharmacy technician but as per the records. He was taking sucralfate 1 gram oral 4 times a day, spironolactone 25 mg once a day, pantoprazole 40 mg oral 2 times a day and levothyroxine 75 mcg oral once a day and nadolol 40 mg oral once a day, allopurinol 300 mg once a day.   PHYSICAL EXAMINATION: VITAL SIGNS: In ER, temperature 98.2 mg, pulse currently 102 but as mentioned above, his pulse rate was up to 140 initially. Blood pressure is 149/96 and pulse oximetry is 97% on room air.  GENERAL: The patient is fully alert and oriented to time, place, and person. Does not appear in any acute distress.  HEENT: Head and neck atraumatic. Conjunctivae pink. Oral mucosa moist.  NECK: Supple. No JVD.  RESPIRATORY: Bilateral equal and clear air entry.  CARDIOVASCULAR: S1, S2 present, regular. No murmur.  ABDOMEN: Soft, nontender. Bowel sounds  present. No organomegaly.  SKIN: No rashes.  LEGS: No edema.   NEUROLOGICAL: Power 5/5. Follows commands. Moves all four limbs. No gross abnormality.  PSYCHIATRIC: Not any gross abnormality at this time.  JOINTS: No swelling or tenderness.   IMPORTANT LABORATORY RESULTS: Glucose 104, BUN 25, creatinine 1.09, sodium 138, potassium is  3.6, chloride is 104, CO2 is 20. Calcium is 9.6, total protein 8.1, bilirubin is 2,  alkaline phosphatase is 347, SGOT 92,  Troponin 0.03.   WBC 6.5, hemoglobin 10.2, platelet count 113 and MCV is 86. Urinalysis is negative with 5 WBCs and trace leukocyte esterase, but no nitrate.   ASSESSMENT AND PLAN: A 80 year old male with past medical history of liver cirrhosis and grade 2 esophageal varices came to Emergency Room with noticing dark, black-colored stool today morning and having some epigastric  soreness,  1. Gastrointestinal bleed, likely it is esophageal as the patient has a history of esophageal varices. We will keep him n.p.o. for now, call gastroenterology consult with Dr. Nehemiah Massed who is the patient's primary gastrologist and check his hemoglobin later on for correction to check for any further drop. Meanwhile, we will keep him n.p.o. and give Protonix IV b.i.d.  2. Tachycardia. This was likely supraventricular tachycardia, may be due to anxiety and due to bleeding. Responded to Cardizem injection. Currently, heart rate is stable. We will give him  some IV fluids and keep him monitoring on telemetry.  3. Hypertension. He did not take any medication this morning because of these this issue. Currently blood pressure is high. I will give him amlodipine and nadolol for now and we will keep monitoring.  4. Hepatic cirrhosis. This is a chronic issue and appears to be stable. Currently, the patient is fully alert and oriented and no signs of encephalopathy. We will continue monitoring.  3. Hypothyroidism. We will continue giving levothyroxine as home dose.  4. Chronic anemia. Hemoglobin is stable currently. Because of gastrointestinal bleed issue we will continue monitoring. Currently does not need any blood transfusion.  5. Gout. We will continue allopurinol, right now there is no pain.   CODE STATUS: Full code.   TOTAL TIME SPENT ON THIS ADMISSION: 50 minutes.    ____________________________ Ceasar Lund Anselm Jungling, MD vgv:sg D: 05/04/2013 10:25:15 ET T: 05/04/2013 10:45:18 ET JOB#: [id number]  cc: Ceasar Lund. Anselm Jungling, MD, <Dictator> John B. Sarina Ser, MD Lollie Sails, MD  Rosalio Macadamia [facility] MD ELECTRONICALLY SIGNED 05/11/2013 22:20

## 2014-04-28 ENCOUNTER — Other Ambulatory Visit: Payer: Self-pay | Admitting: Gastroenterology

## 2014-04-28 DIAGNOSIS — K7030 Alcoholic cirrhosis of liver without ascites: Secondary | ICD-10-CM

## 2014-05-04 ENCOUNTER — Other Ambulatory Visit: Payer: Self-pay | Admitting: Gastroenterology

## 2014-05-04 DIAGNOSIS — K7030 Alcoholic cirrhosis of liver without ascites: Secondary | ICD-10-CM

## 2014-05-06 ENCOUNTER — Ambulatory Visit: Admission: RE | Admit: 2014-05-06 | Payer: Medicare Other | Source: Ambulatory Visit

## 2014-05-06 ENCOUNTER — Ambulatory Visit
Admission: RE | Admit: 2014-05-06 | Discharge: 2014-05-06 | Disposition: A | Discharge status: Home / Self Care | Payer: Medicare Other | Source: Ambulatory Visit | Attending: Gastroenterology | Admitting: Gastroenterology

## 2014-05-06 DIAGNOSIS — K7030 Alcoholic cirrhosis of liver without ascites: Secondary | ICD-10-CM | POA: Insufficient documentation

## 2014-05-06 DIAGNOSIS — K8020 Calculus of gallbladder without cholecystitis without obstruction: Secondary | ICD-10-CM | POA: Insufficient documentation

## 2014-08-02 ENCOUNTER — Other Ambulatory Visit: Payer: Self-pay | Admitting: *Deleted

## 2014-08-02 ENCOUNTER — Inpatient Hospital Stay: Payer: Medicare Other | Attending: Oncology

## 2014-08-02 ENCOUNTER — Inpatient Hospital Stay (HOSPITAL_BASED_OUTPATIENT_CLINIC_OR_DEPARTMENT_OTHER): Payer: Medicare Other | Admitting: Oncology

## 2014-08-02 VITALS — BP 142/76 | HR 59 | Temp 98.8°F | Ht 73.0 in | Wt 205.0 lb

## 2014-08-02 DIAGNOSIS — D649 Anemia, unspecified: Secondary | ICD-10-CM

## 2014-08-02 DIAGNOSIS — D461 Refractory anemia with ring sideroblasts: Secondary | ICD-10-CM | POA: Diagnosis not present

## 2014-08-02 DIAGNOSIS — Z79899 Other long term (current) drug therapy: Secondary | ICD-10-CM | POA: Insufficient documentation

## 2014-08-02 DIAGNOSIS — N189 Chronic kidney disease, unspecified: Secondary | ICD-10-CM | POA: Insufficient documentation

## 2014-08-02 DIAGNOSIS — D469 Myelodysplastic syndrome, unspecified: Secondary | ICD-10-CM | POA: Diagnosis not present

## 2014-08-02 DIAGNOSIS — I129 Hypertensive chronic kidney disease with stage 1 through stage 4 chronic kidney disease, or unspecified chronic kidney disease: Secondary | ICD-10-CM | POA: Insufficient documentation

## 2014-08-02 LAB — CBC WITH DIFFERENTIAL/PLATELET
Basophils Absolute: 0 10*3/uL (ref 0–0.1)
Basophils Relative: 1 %
EOS PCT: 5 %
Eosinophils Absolute: 0.2 10*3/uL (ref 0–0.7)
HCT: 44.9 % (ref 40.0–52.0)
HEMOGLOBIN: 15 g/dL (ref 13.0–18.0)
LYMPHS ABS: 1.1 10*3/uL (ref 1.0–3.6)
Lymphocytes Relative: 23 %
MCH: 38.2 pg — ABNORMAL HIGH (ref 26.0–34.0)
MCHC: 33.4 g/dL (ref 32.0–36.0)
MCV: 114.5 fL — ABNORMAL HIGH (ref 80.0–100.0)
Monocytes Absolute: 0.7 10*3/uL (ref 0.2–1.0)
Monocytes Relative: 15 %
NEUTROS ABS: 2.7 10*3/uL (ref 1.4–6.5)
NEUTROS PCT: 56 %
PLATELETS: 105 10*3/uL — AB (ref 150–440)
RBC: 3.92 MIL/uL — AB (ref 4.40–5.90)
RDW: 14.8 % — ABNORMAL HIGH (ref 11.5–14.5)
WBC: 4.8 10*3/uL (ref 3.8–10.6)

## 2014-08-02 NOTE — Progress Notes (Signed)
Pt here today for follow up regarding cirrhosis; offers no complaints today

## 2014-08-06 NOTE — Progress Notes (Signed)
[place]  Telephone:(336) [phone] Fax:(336) [phone]  ID: [name] OB: [birth year]  MR#: [id number]  YHC#:[id number]  Patient Care Team: Madelyn Brunner, MD as PCP - General (Internal Medicine)  CHIEF COMPLAINT: MDS, specifically refractory anemia with ringed sideroblasts   Chief Complaint  Patient presents with  . Follow-up    INTERVAL HISTORY: Patient returns to clinic for laboratory work and routine evaluation. He currently feels well and remains asymptomatic.  He has a good appetite and denies weight loss.  He denies any fevers or night sweats.  He denies any easy bleeding or bruising.  He denies any weakness or fatigue.  He has no neurologic complaints.  He denies any chest pain or shortness of breath.  He has no nausea, vomiting, constipation, or diarrhea.  He has no urinary complaints.  Patient offers no specific complaints today.   REVIEW OF SYSTEMS:   Review of Systems  Constitutional: Negative.   Respiratory: Negative.   Cardiovascular: Negative.   Gastrointestinal: Negative.   Neurological: Negative.   Endo/Heme/Allergies: Does not bruise/bleed easily.    As per HPI. Otherwise, a complete review of systems is negatve.  PAST MEDICAL HISTORY: Past Medical History  Diagnosis Date  . Hypertension   . Chronic kidney disease   . Anemia     PAST SURGICAL HISTORY: Past Surgical History  Procedure Laterality Date  . Radiology with anesthesia N/A 05/07/2013    Procedure: RADIOLOGY WITH ANESTHESIA;  Surgeon: Jacqulynn Cadet, MD;  Location: [place];  Service: Radiology;  Laterality: N/A;    FAMILY HISTORY Family History  Problem Relation Age of Onset  . Family history unknown: Yes       ADVANCED DIRECTIVES:    HEALTH MAINTENANCE: History  Substance Use Topics  . Smoking status: Never Smoker   . Smokeless tobacco: Never Used  . Alcohol Use: Yes     Colonoscopy:  PAP:  Bone density:  Lipid panel:  No Known Allergies  Current  Outpatient Prescriptions  Medication Sig Dispense Refill  . albuterol (PROVENTIL) (2.5 MG/3ML) 0.083% nebulizer solution Take 3 mLs (2.5 mg total) by nebulization every 6 (six) hours as needed for wheezing or shortness of breath. 75 mL 12  . allopurinol (ZYLOPRIM) 300 MG tablet Take 300 mg by mouth daily.    . cholecalciferol (VITAMIN D) 1000 UNITS tablet Take 1,000 Units by mouth daily.    . ferrous sulfate (FERROUSUL) 325 (65 FE) MG tablet Take 325 mg by mouth daily with breakfast.    . folic acid (FOLVITE) 1 MG tablet Take 1 tablet (1 mg total) by mouth daily.    . furosemide (LASIX) 40 MG tablet Take 1 tablet (40 mg total) by mouth daily. 30 tablet   . levothyroxine (SYNTHROID, LEVOTHROID) 75 MCG tablet Take 75 mcg by mouth daily before breakfast.    . mometasone-formoterol (DULERA) 100-5 MCG/ACT AERO Inhale 2 puffs into the lungs 2 (two) times daily.    . nadolol (CORGARD) 40 MG tablet Take 40 mg by mouth daily.    . pantoprazole (PROTONIX) 40 MG tablet Take 1 tablet (40 mg total) by mouth daily.    Marland Kitchen spironolactone (ALDACTONE) 25 MG tablet Take 25 mg by mouth daily.    Marland Kitchen thiamine (VITAMIN B-1) 100 MG tablet TAKE 1 TABLET BY MOUTH EVERY DAY    . thiamine 100 MG tablet Take 1 tablet (100 mg total) by mouth daily.    . feeding supplement, ENSURE, (ENSURE) PUDG Take 1 Container by mouth  3 (three) times daily between meals. (Patient not taking: Reported on 08/02/2014)  0  . tamsulosin (FLOMAX) 0.4 MG CAPS capsule Take 1 capsule (0.4 mg total) by mouth daily. (Patient not taking: Reported on 08/02/2014) 30 capsule    No current facility-administered medications for this visit.    OBJECTIVE: Filed Vitals:   08/02/14 1014  BP: 142/76  Pulse: 59  Temp: 98.8 F (37.1 C)     Body mass index is 27.06 kg/(m^2).    ECOG FS:0 - Asymptomatic  General: Well-developed, well-nourished, no acute distress. Eyes: Pink conjunctiva, anicteric sclera. Lungs: Clear to auscultation bilaterally. Heart:  Regular rate and rhythm. No rubs, murmurs, or gallops. Abdomen: Soft, nontender, nondistended. No organomegaly noted, normoactive bowel sounds. Musculoskeletal: No edema, cyanosis, or clubbing. Neuro: Alert, answering all questions appropriately. Cranial nerves grossly intact. Skin: No rashes or petechiae noted. Psych: Normal affect.   LAB RESULTS:  Lab Results  Component Value Date   NA 139 05/19/2013   K 4.2 05/19/2013   CL 109 05/19/2013   CO2 20 05/19/2013   GLUCOSE 134* 05/19/2013   BUN 23 05/19/2013   CREATININE 1.17 05/19/2013   CALCIUM 8.4 05/19/2013   PROT 6.3 05/14/2013   ALBUMIN 2.0* 05/14/2013   AST 59* 05/14/2013   ALT 22 05/14/2013   ALKPHOS 337* 05/14/2013   BILITOT 2.3* 05/14/2013   GFRNONAA 58* 05/19/2013   GFRAA 68* 05/19/2013    Lab Results  Component Value Date   WBC 4.8 08/02/2014   NEUTROABS 2.7 08/02/2014   HGB 15.0 08/02/2014   HCT 44.9 08/02/2014   MCV 114.5* 08/02/2014   PLT 105* 08/02/2014     STUDIES: No results found.  ASSESSMENT: MDS, specifically refractory anemia with ringed sideroblasts  PLAN:    1.  MDS: Other than a mild thrombocytopenia, patient's CBC remains within normal limits. Since December of 2009 his platelet count has ranged between 71 and 107.  No intervention is needed at this time. Patient does not require repeat bone marrow biopsy. Return to clinic in 6 months for repeat laboratory work and further evaluation.  Patient expressed understanding and was in agreement with this plan. He also understands that He can call clinic at any time with any questions, concerns, or complaints.    Lloyd Huger, MD   08/06/2014 12:49 PM

## 2015-02-02 ENCOUNTER — Inpatient Hospital Stay: Payer: Medicare Other | Attending: Oncology

## 2015-02-02 ENCOUNTER — Inpatient Hospital Stay (HOSPITAL_BASED_OUTPATIENT_CLINIC_OR_DEPARTMENT_OTHER): Payer: Medicare Other | Admitting: Oncology

## 2015-02-02 VITALS — BP 125/75 | HR 59 | Temp 98.1°F | Resp 16 | Wt 205.9 lb

## 2015-02-02 DIAGNOSIS — D469 Myelodysplastic syndrome, unspecified: Secondary | ICD-10-CM

## 2015-02-02 DIAGNOSIS — Z79899 Other long term (current) drug therapy: Secondary | ICD-10-CM | POA: Diagnosis not present

## 2015-02-02 DIAGNOSIS — I129 Hypertensive chronic kidney disease with stage 1 through stage 4 chronic kidney disease, or unspecified chronic kidney disease: Secondary | ICD-10-CM | POA: Diagnosis not present

## 2015-02-02 DIAGNOSIS — D461 Refractory anemia with ring sideroblasts: Secondary | ICD-10-CM | POA: Insufficient documentation

## 2015-02-02 DIAGNOSIS — N189 Chronic kidney disease, unspecified: Secondary | ICD-10-CM | POA: Diagnosis not present

## 2015-02-02 LAB — CBC WITH DIFFERENTIAL/PLATELET
BASOS ABS: 0 10*3/uL (ref 0–0.1)
BASOS PCT: 1 %
Eosinophils Absolute: 0.2 10*3/uL (ref 0–0.7)
Eosinophils Relative: 5 %
HCT: 46 % (ref 40.0–52.0)
HEMOGLOBIN: 16 g/dL (ref 13.0–18.0)
Lymphocytes Relative: 22 %
Lymphs Abs: 0.9 10*3/uL — ABNORMAL LOW (ref 1.0–3.6)
MCH: 38.2 pg — ABNORMAL HIGH (ref 26.0–34.0)
MCHC: 34.8 g/dL (ref 32.0–36.0)
MCV: 109.8 fL — ABNORMAL HIGH (ref 80.0–100.0)
MONO ABS: 0.6 10*3/uL (ref 0.2–1.0)
Monocytes Relative: 15 %
NEUTROS ABS: 2.4 10*3/uL (ref 1.4–6.5)
NEUTROS PCT: 57 %
Platelets: 90 10*3/uL — ABNORMAL LOW (ref 150–440)
RBC: 4.19 MIL/uL — ABNORMAL LOW (ref 4.40–5.90)
RDW: 14.6 % — AB (ref 11.5–14.5)
WBC: 4.2 10*3/uL (ref 3.8–10.6)

## 2015-02-02 NOTE — Progress Notes (Signed)
[place]  Telephone:(336) [phone] Fax:(336) [phone]  ID: Joseph Hawkins OB: [birth year]  MR#: [id number]  DDU#:[id number]  Patient Care Team: Madelyn Brunner, MD as PCP - General (Internal Medicine)  CHIEF COMPLAINT: MDS, specifically refractory anemia with ringed sideroblasts   Chief Complaint  Patient presents with  . MDS    INTERVAL HISTORY: Patient returns to clinic for laboratory work and routine evaluation. He currently feels well and remains asymptomatic.  He has a good appetite and denies weight loss.  He denies any fevers or night sweats.  He denies any easy bleeding or bruising.  He denies any weakness or fatigue.  He has no neurologic complaints.  He denies any chest pain or shortness of breath.  He has no nausea, vomiting, constipation, or diarrhea.  He has no urinary complaints.  Patient offers no specific complaints today.   REVIEW OF SYSTEMS:   Review of Systems  Constitutional: Negative.   Respiratory: Negative.   Cardiovascular: Negative.   Gastrointestinal: Negative.   Neurological: Negative.   Endo/Heme/Allergies: Does not bruise/bleed easily.    As per HPI. Otherwise, a complete review of systems is negatve.  PAST MEDICAL HISTORY: Past Medical History  Diagnosis Date  . Hypertension   . Chronic kidney disease   . Anemia     PAST SURGICAL HISTORY: Past Surgical History  Procedure Laterality Date  . Radiology with anesthesia N/A 05/07/2013    Procedure: RADIOLOGY WITH ANESTHESIA;  Surgeon: Jacqulynn Cadet, MD;  Location: [facility];  Service: Radiology;  Laterality: N/A;    FAMILY HISTORY Family History  Problem Relation Age of Onset  . Family history unknown: Yes       ADVANCED DIRECTIVES:    HEALTH MAINTENANCE: Social History  Substance Use Topics  . Smoking status: Never Smoker   . Smokeless tobacco: Never Used  . Alcohol Use: Yes     Colonoscopy:  PAP:  Bone density:  Lipid panel:  No Known Allergies  Current  Outpatient Prescriptions  Medication Sig Dispense Refill  . allopurinol (ZYLOPRIM) 300 MG tablet Take 300 mg by mouth daily.    . cholecalciferol (VITAMIN D) 1000 UNITS tablet Take 1,000 Units by mouth daily.    . ferrous sulfate (FERROUSUL) 325 (65 FE) MG tablet Take 325 mg by mouth daily with breakfast.    . folic acid (FOLVITE) 1 MG tablet Take 1 tablet (1 mg total) by mouth daily.    . furosemide (LASIX) 40 MG tablet Take 1 tablet (40 mg total) by mouth daily. 30 tablet   . levothyroxine (SYNTHROID, LEVOTHROID) 75 MCG tablet Take 75 mcg by mouth daily before breakfast.    . nadolol (CORGARD) 40 MG tablet Take 40 mg by mouth daily.    . pantoprazole (PROTONIX) 40 MG tablet Take 1 tablet (40 mg total) by mouth daily.    Marland Kitchen spironolactone (ALDACTONE) 50 MG tablet Take 50 mg by mouth daily.    . tamsulosin (FLOMAX) 0.4 MG CAPS capsule Take 1 capsule (0.4 mg total) by mouth daily. 30 capsule   . thiamine 100 MG tablet Take 1 tablet (100 mg total) by mouth daily.    Marland Kitchen albuterol (PROVENTIL) (2.5 MG/3ML) 0.083% nebulizer solution Take 3 mLs (2.5 mg total) by nebulization every 6 (six) hours as needed for wheezing or shortness of breath. (Patient not taking: Reported on 02/02/2015) 75 mL 12  . feeding supplement, ENSURE, (ENSURE) PUDG Take 1 Container by mouth 3 (three) times daily between meals. (Patient not taking:  Reported on 08/02/2014)  0  . mometasone-formoterol (DULERA) 100-5 MCG/ACT AERO Inhale 2 puffs into the lungs 2 (two) times daily. (Patient not taking: Reported on 02/02/2015)    . spironolactone (ALDACTONE) 25 MG tablet Take 25 mg by mouth daily. Reported on 02/02/2015    . thiamine (VITAMIN B-1) 100 MG tablet Reported on 02/02/2015     No current facility-administered medications for this visit.    OBJECTIVE: Filed Vitals:   02/02/15 1033  BP: 125/75  Pulse: 59  Temp: 98.1 F (36.7 C)  Resp: 16     Body mass index is 27.17 kg/(m^2).    ECOG FS:0 - Asymptomatic  General: Well-developed,  well-nourished, no acute distress. Eyes: Pink conjunctiva, anicteric sclera. Lungs: Clear to auscultation bilaterally. Heart: Regular rate and rhythm. No rubs, murmurs, or gallops. Abdomen: Soft, nontender, nondistended. No organomegaly noted, normoactive bowel sounds. Musculoskeletal: No edema, cyanosis, or clubbing. Neuro: Alert, answering all questions appropriately. Cranial nerves grossly intact. Skin: No rashes or petechiae noted. Psych: Normal affect.   LAB RESULTS:  Lab Results  Component Value Date   NA 139 05/19/2013   K 4.2 05/19/2013   CL 109 05/19/2013   CO2 20 05/19/2013   GLUCOSE 134* 05/19/2013   BUN 23 05/19/2013   CREATININE 1.17 05/19/2013   CALCIUM 8.4 05/19/2013   PROT 6.3 05/14/2013   ALBUMIN 2.0* 05/14/2013   AST 59* 05/14/2013   ALT 22 05/14/2013   ALKPHOS 337* 05/14/2013   BILITOT 2.3* 05/14/2013   GFRNONAA 58* 05/19/2013   GFRAA 68* 05/19/2013    Lab Results  Component Value Date   WBC 4.2 02/02/2015   NEUTROABS 2.4 02/02/2015   HGB 16.0 02/02/2015   HCT 46.0 02/02/2015   MCV 109.8* 02/02/2015   PLT 90* 02/02/2015     STUDIES: No results found.  ASSESSMENT: MDS, specifically refractory anemia with ringed sideroblasts  PLAN:    1.  MDS: Patients CBC remains stable. Since December of 2009 his platelet count has ranged between 71 and 107.  No intervention is needed at this time. Patient does not require repeat bone marrow biopsy. Return to clinic in 6 months for repeat laboratory work and further evaluation. 2.  Elevated MCV: Secondary to underlying MDS.  Patient expressed understanding and was in agreement with this plan. He also understands that He can call clinic at any time with any questions, concerns, or complaints.    Mayra Reel, NP   02/02/2015 10:45 AM   Patient was seen and evaluated independently and I agree with the assessment and plan as dictated above.  Lloyd Huger, MD 02/06/2015 7:39 AM

## 2015-02-02 NOTE — Progress Notes (Signed)
Patient does not offer any problems today.  

## 2015-03-15 DIAGNOSIS — N2889 Other specified disorders of kidney and ureter: Secondary | ICD-10-CM | POA: Insufficient documentation

## 2015-03-15 DIAGNOSIS — N181 Chronic kidney disease, stage 1: Secondary | ICD-10-CM | POA: Insufficient documentation

## 2015-04-04 DIAGNOSIS — I10 Essential (primary) hypertension: Secondary | ICD-10-CM | POA: Insufficient documentation

## 2015-06-16 IMAGING — CT CT ANGIO ABDOMEN
4 of 10 series · 11 of 46 positions shown, 18 images · IV contrast (omnipaque)
Comparison: none

CLINICAL DATA: Cirrhosis and bleeding varices.

EXAM:
CT ANGIOGRAPHY ABDOMEN AND PELVIS
TECHNIQUE: Multidetector CT imaging of the abdomen and pelvis was performed
using the standard protocol during bolus administration of
intravenous contrast. Multiplanar reconstructed images including
MIPs were obtained and reviewed to evaluate the vascular anatomy.
CONTRAST:  80mL OMNIPAQUE IOHEXOL 350 MG/ML SOLN

[Series 3: arterial phase 3.0 b30f · axial · arterial · 0.66mm/px · z∈[+1409,+1511]mm · 3 of 101 slices shown]
[im 17/101  soft-tissue]
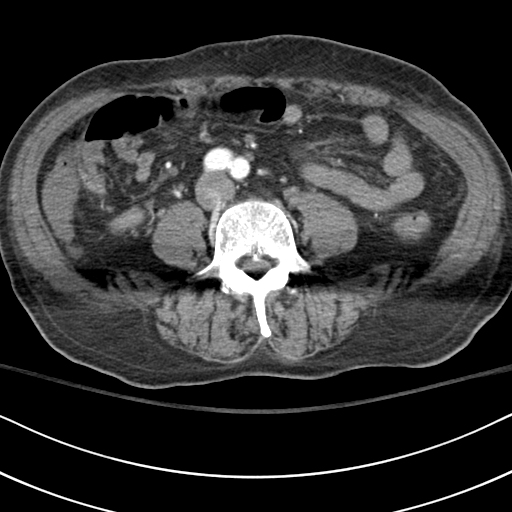
[im 34/101  soft-tissue]
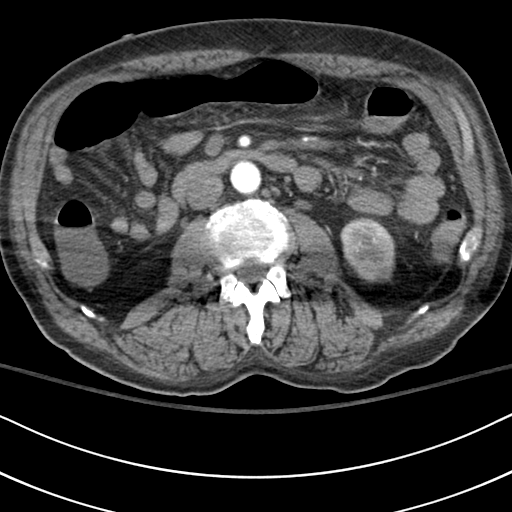
[im 51/101  soft-tissue]
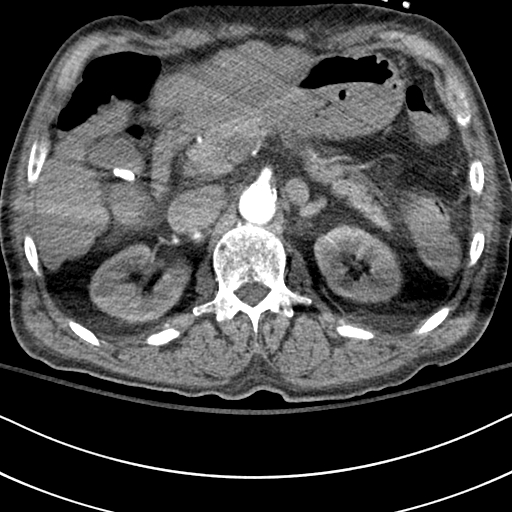

[Series 7: venous phase 3.0 b30f · axial · portal-venous · 0.66mm/px · z∈[+1421,+1601]mm · 4 of 101 slices shown, 9 images]
[im 21/101  soft-tissue]
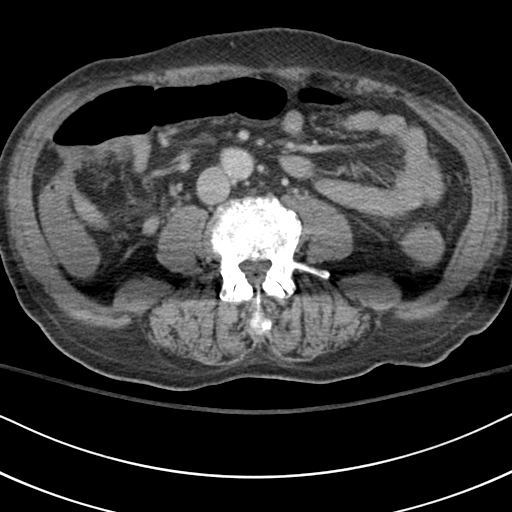
[im 21/101  lung]
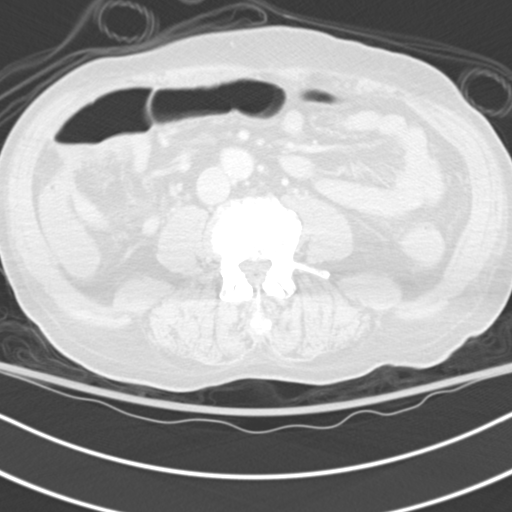
[im 21/101  bone]
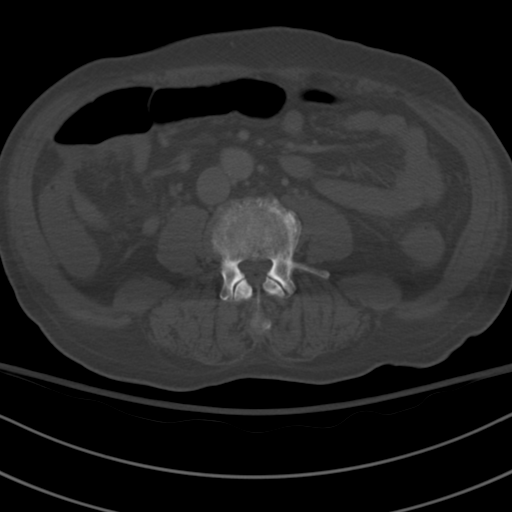
[im 41/101  soft-tissue]
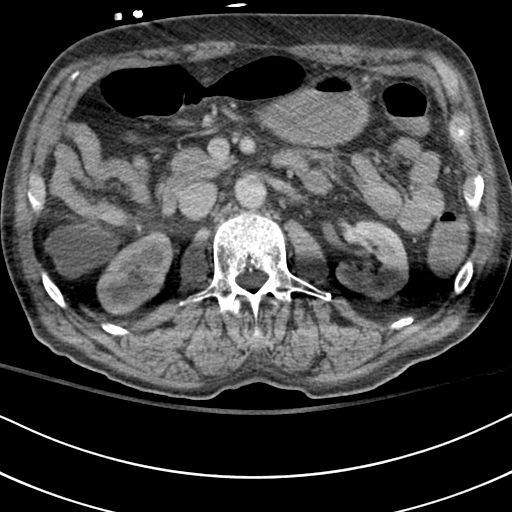
[im 41/101  lung]
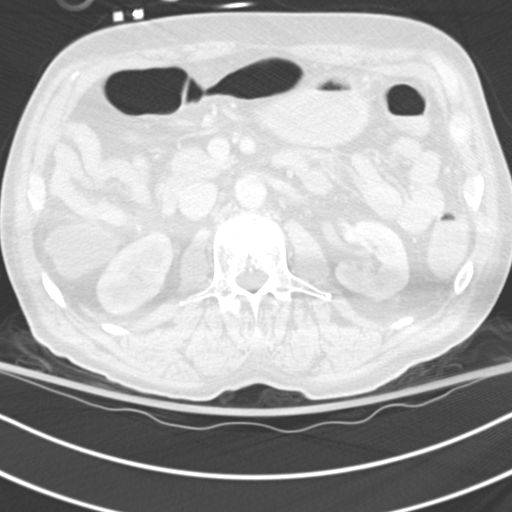
[im 61/101  soft-tissue]
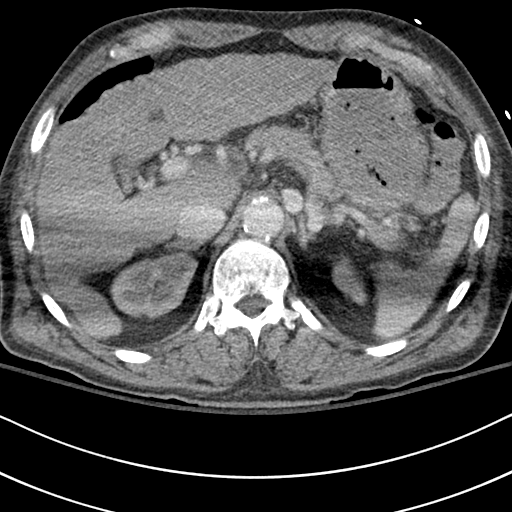
[im 61/101  lung]
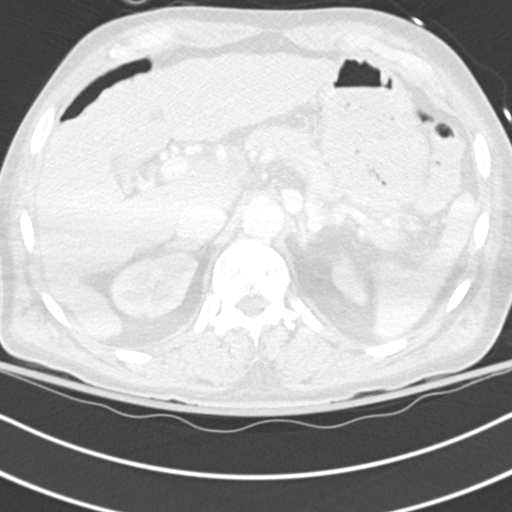
[im 81/101  soft-tissue]
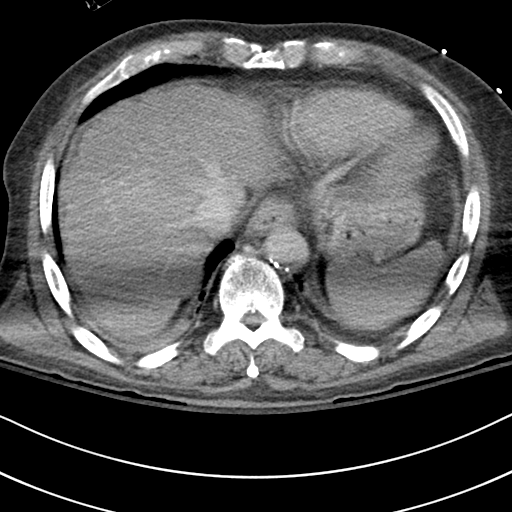
[im 81/101  lung]
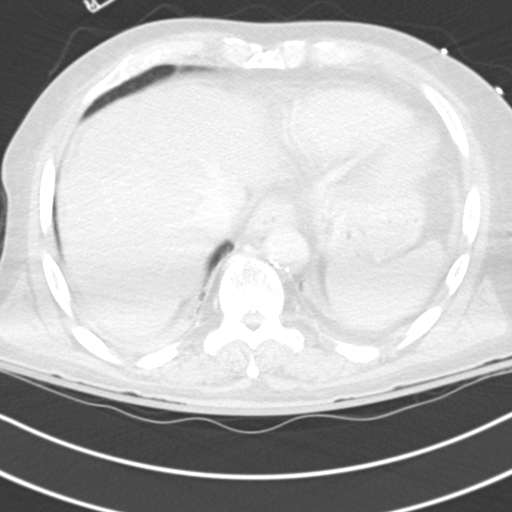

[Series 15: venous sagittal · sagittal · portal-venous · 0.74mm/px · 1 of 193 slices shown, 2 images]
[im 97/193  soft-tissue]
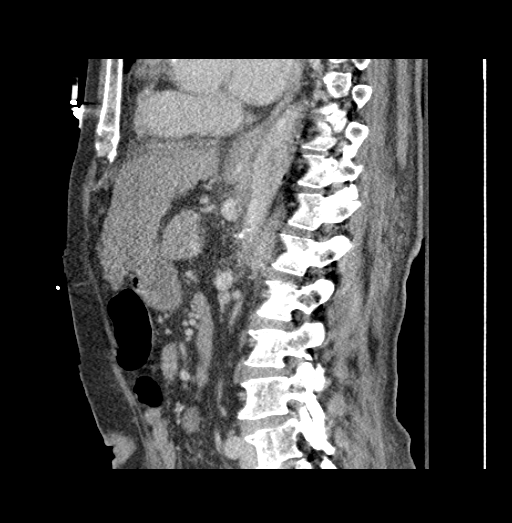
[im 97/193  bone]
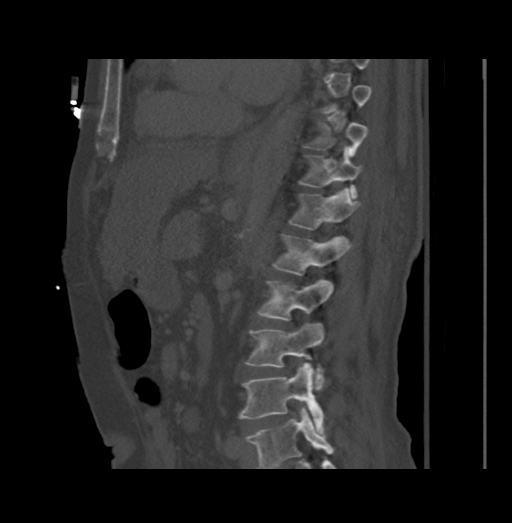

[Series 16: venous coronal · coronal · portal-venous · 0.84mm/px · 3 of 122 slices shown, 4 images]
[im 25/122  soft-tissue]
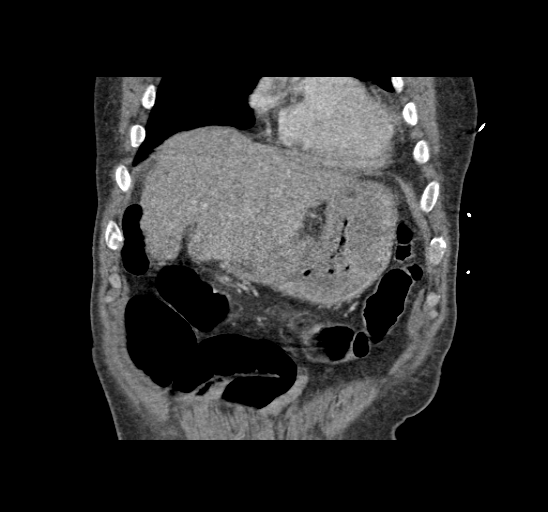
[im 49/122  soft-tissue]
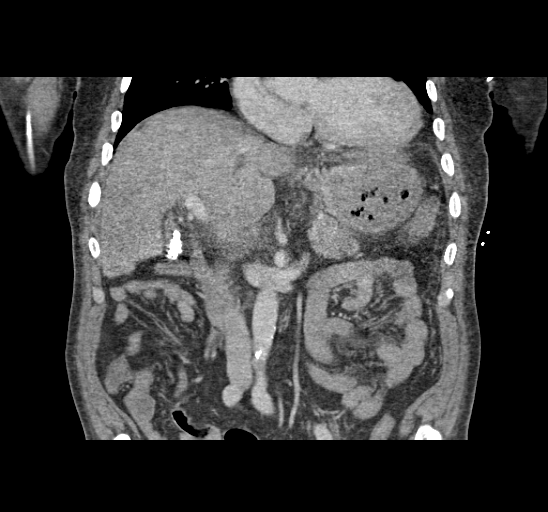
[im 49/122  bone]
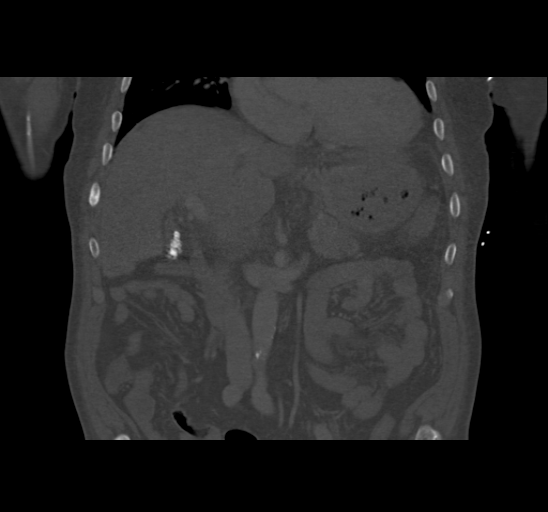
[im 73/122  soft-tissue]
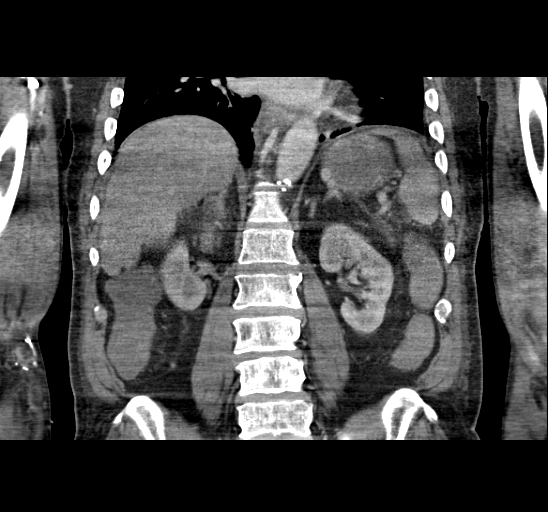

[11 of 46 positions shown; findings below may reference images not displayed]

FINDINGS: ARTERIAL FINDINGS:

Aorta:                Mild atherosclerotic plaque without aneurysm.

Celiac axis: Celiac trunk is patent. Main branches of the celiac
trunk are the left gastric artery and the splenic artery.

Superior mesenteric: Variant anatomy. The common hepatic artery
originates from the proximal SMA. SMA is patent.

Left renal:           Patent.

Right renal:          Patent.

Inferior mesenteric:  Patent.

Left iliac:           Left common iliac artery is patent.

Right iliac:          Right common iliac artery is patent.

Venous findings: The portal veins, SMV and splenic vein are patent.
IVC is patent. Renal veins are patent. There are large varices
around the gastric cardia and fundus that drain into the left renal
vein and consistent with a gastrorenal shunt. No significant
esophageal varices.

Review of the MIP images confirms the above findings.

Nonvascular findings: There is volume loss and atelectasis at both
lung bases. No evidence for free air. The liver is very nodular and
consistent with cirrhosis. Spleen size is normal. No gross
abnormality to the adrenal glands, kidneys or pancreas. No evidence
for abdominal ascites. The bone structures are heterogeneous but no
focal abnormality. Gallstone without evidence of gallbladder wall
dilatation or inflammatory changes.
IMPRESSION: Gastric varices involving the gastric fundus and cardia. These
varices are draining into the left renal vein and consistent with a
gastrorenal shunt. No significant esophageal varices.

Cirrhosis.  Portal venous system is patent.

Variant arterial anatomy. The common hepatic artery originates from
the superior mesenteric artery.

## 2015-08-02 ENCOUNTER — Encounter: Payer: Self-pay | Admitting: Oncology

## 2015-08-02 DIAGNOSIS — D469 Myelodysplastic syndrome, unspecified: Secondary | ICD-10-CM

## 2015-08-02 HISTORY — DX: Myelodysplastic syndrome, unspecified: D46.9

## 2015-08-02 NOTE — Progress Notes (Signed)
[place]  Telephone:(336) [phone] Fax:(336) [phone]  ID: [name] OB: 1934-11-09  MR#: [id number]  TIR#:[id number]  Patient Care Team: Madelyn Brunner, MD as PCP - General (Internal Medicine)  CHIEF COMPLAINT: MDS, specifically refractory anemia with ringed sideroblasts  INTERVAL HISTORY: Patient returns to clinic for laboratory work and routine evaluation. He continues to feel well and remains asymptomatic.  He has a good appetite and denies weight loss.  He denies any fevers or night sweats.  He denies any easy bleeding or bruising.  He denies any weakness or fatigue.  He has no neurologic complaints.  He denies any chest pain or shortness of breath.  He has no nausea, vomiting, constipation, or diarrhea.  He has no urinary complaints.  Patient offers no specific complaints today.   REVIEW OF SYSTEMS:   Review of Systems  Constitutional: Negative.  Negative for fever, malaise/fatigue and weight loss.  Respiratory: Negative.  Negative for cough and shortness of breath.   Cardiovascular: Negative.  Negative for chest pain.  Gastrointestinal: Negative.  Negative for abdominal pain, blood in stool and melena.  Musculoskeletal: Negative.   Neurological: Negative.  Negative for weakness.  Endo/Heme/Allergies: Does not bruise/bleed easily.  Psychiatric/Behavioral: Negative.  The patient is not nervous/anxious.     As per HPI. Otherwise, a complete review of systems is negatve.  PAST MEDICAL HISTORY: Past Medical History:  Diagnosis Date  . Anemia   . Chronic kidney disease   . Hypertension     PAST SURGICAL HISTORY: Past Surgical History:  Procedure Laterality Date  . RADIOLOGY WITH ANESTHESIA N/A 05/07/2013   Procedure: RADIOLOGY WITH ANESTHESIA;  Surgeon: Jacqulynn Cadet, MD;  Location: [facility];  Service: Radiology;  Laterality: N/A;    FAMILY HISTORY Family History  Problem Relation Age of Onset  . Family history unknown: Yes       ADVANCED  DIRECTIVES:    HEALTH MAINTENANCE: Social History  Substance Use Topics  . Smoking status: Never Smoker  . Smokeless tobacco: Never Used  . Alcohol use Yes     Colonoscopy:  PAP:  Bone density:  Lipid panel:  No Known Allergies  Current Outpatient Prescriptions  Medication Sig Dispense Refill  . albuterol (PROVENTIL) (2.5 MG/3ML) 0.083% nebulizer solution Take 3 mLs (2.5 mg total) by nebulization every 6 (six) hours as needed for wheezing or shortness of breath. (Patient not taking: Reported on 02/02/2015) 75 mL 12  . allopurinol (ZYLOPRIM) 300 MG tablet Take 300 mg by mouth daily.    . cholecalciferol (VITAMIN D) 1000 UNITS tablet Take 1,000 Units by mouth daily.    . feeding supplement, ENSURE, (ENSURE) PUDG Take 1 Container by mouth 3 (three) times daily between meals. (Patient not taking: Reported on 08/02/2014)  0  . ferrous sulfate (FERROUSUL) 325 (65 FE) MG tablet Take 325 mg by mouth daily with breakfast.    . folic acid (FOLVITE) 1 MG tablet Take 1 tablet (1 mg total) by mouth daily.    . furosemide (LASIX) 40 MG tablet Take 1 tablet (40 mg total) by mouth daily. 30 tablet   . levothyroxine (SYNTHROID, LEVOTHROID) 75 MCG tablet Take 75 mcg by mouth daily before breakfast.    . mometasone-formoterol (DULERA) 100-5 MCG/ACT AERO Inhale 2 puffs into the lungs 2 (two) times daily. (Patient not taking: Reported on 02/02/2015)    . nadolol (CORGARD) 40 MG tablet Take 40 mg by mouth daily.    . pantoprazole (PROTONIX) 40 MG tablet Take 1  tablet (40 mg total) by mouth daily.    Marland Kitchen spironolactone (ALDACTONE) 25 MG tablet Take 25 mg by mouth daily. Reported on 02/02/2015    . spironolactone (ALDACTONE) 50 MG tablet Take 50 mg by mouth daily.    . tamsulosin (FLOMAX) 0.4 MG CAPS capsule Take 1 capsule (0.4 mg total) by mouth daily. 30 capsule   . thiamine (VITAMIN B-1) 100 MG tablet Reported on 02/02/2015    . thiamine 100 MG tablet Take 1 tablet (100 mg total) by mouth daily.     No current  facility-administered medications for this visit.     OBJECTIVE: There were no vitals filed for this visit.   There is no height or weight on file to calculate BMI.    ECOG FS:0 - Asymptomatic  General: Well-developed, well-nourished, no acute distress. Eyes: Pink conjunctiva, anicteric sclera. Lungs: Clear to auscultation bilaterally. Heart: Regular rate and rhythm. No rubs, murmurs, or gallops. Abdomen: Soft, nontender, nondistended. No organomegaly noted, normoactive bowel sounds. Musculoskeletal: No edema, cyanosis, or clubbing. Neuro: Alert, answering all questions appropriately. Cranial nerves grossly intact. Skin: No rashes or petechiae noted. Psych: Normal affect.   LAB RESULTS:  Lab Results  Component Value Date   NA 139 05/19/2013   K 4.2 05/19/2013   CL 109 05/19/2013   CO2 20 05/19/2013   GLUCOSE 134 (H) 05/19/2013   BUN 23 05/19/2013   CREATININE 1.17 05/19/2013   CALCIUM 8.4 05/19/2013   PROT 6.3 05/14/2013   ALBUMIN 2.0 (L) 05/14/2013   AST 59 (H) 05/14/2013   ALT 22 05/14/2013   ALKPHOS 337 (H) 05/14/2013   BILITOT 2.3 (H) 05/14/2013   GFRNONAA 58 (L) 05/19/2013   GFRAA 68 (L) 05/19/2013    Lab Results  Component Value Date   WBC 4.2 02/02/2015   NEUTROABS 2.4 02/02/2015   HGB 16.0 02/02/2015   HCT 46.0 02/02/2015   MCV 109.8 (H) 02/02/2015   PLT 90 (L) 02/02/2015     STUDIES: No results found.  ASSESSMENT: MDS, specifically refractory anemia with ringed sideroblasts  PLAN:    1.  MDS: Patients CBC remains stable. Since December of 2009 his platelet count has ranged between 71 and 107.  No intervention is needed at this time. Patient does not require repeat bone marrow biopsy. Return to clinic in 6 months for repeat laboratory work And then in one year for laboratory work and and further evaluation. 2.  Elevated MCV: Secondary to underlying MDS.  Patient expressed understanding and was in agreement with this plan. He also understands that He  can call clinic at any time with any questions, concerns, or complaints.    Joseph Huger, MD   08/02/2015 11:18 PM

## 2015-08-03 ENCOUNTER — Inpatient Hospital Stay (HOSPITAL_BASED_OUTPATIENT_CLINIC_OR_DEPARTMENT_OTHER): Payer: Medicare Other | Admitting: Oncology

## 2015-08-03 ENCOUNTER — Other Ambulatory Visit: Payer: Self-pay

## 2015-08-03 ENCOUNTER — Encounter (INDEPENDENT_AMBULATORY_CARE_PROVIDER_SITE_OTHER): Payer: Self-pay

## 2015-08-03 ENCOUNTER — Inpatient Hospital Stay: Payer: Medicare Other | Attending: Oncology

## 2015-08-03 ENCOUNTER — Encounter: Payer: Self-pay | Admitting: Oncology

## 2015-08-03 DIAGNOSIS — N189 Chronic kidney disease, unspecified: Secondary | ICD-10-CM | POA: Diagnosis not present

## 2015-08-03 DIAGNOSIS — D469 Myelodysplastic syndrome, unspecified: Secondary | ICD-10-CM

## 2015-08-03 DIAGNOSIS — I129 Hypertensive chronic kidney disease with stage 1 through stage 4 chronic kidney disease, or unspecified chronic kidney disease: Secondary | ICD-10-CM | POA: Diagnosis not present

## 2015-08-03 DIAGNOSIS — D461 Refractory anemia with ring sideroblasts: Secondary | ICD-10-CM | POA: Insufficient documentation

## 2015-08-03 DIAGNOSIS — Z79899 Other long term (current) drug therapy: Secondary | ICD-10-CM

## 2015-08-03 DIAGNOSIS — R799 Abnormal finding of blood chemistry, unspecified: Secondary | ICD-10-CM | POA: Diagnosis not present

## 2015-08-03 LAB — CBC WITH DIFFERENTIAL/PLATELET
BASOS ABS: 0 10*3/uL (ref 0–0.1)
Basophils Relative: 1 %
EOS ABS: 0.3 10*3/uL (ref 0–0.7)
EOS PCT: 5 %
HCT: 44.7 % (ref 40.0–52.0)
Hemoglobin: 15.9 g/dL (ref 13.0–18.0)
LYMPHS PCT: 25 %
Lymphs Abs: 1.2 10*3/uL (ref 1.0–3.6)
MCH: 39.8 pg — ABNORMAL HIGH (ref 26.0–34.0)
MCHC: 35.6 g/dL (ref 32.0–36.0)
MCV: 112 fL — AB (ref 80.0–100.0)
MONO ABS: 0.8 10*3/uL (ref 0.2–1.0)
Monocytes Relative: 18 %
Neutro Abs: 2.4 10*3/uL (ref 1.4–6.5)
Neutrophils Relative %: 51 %
Platelets: 108 10*3/uL — ABNORMAL LOW (ref 150–440)
RBC: 3.99 MIL/uL — ABNORMAL LOW (ref 4.40–5.90)
RDW: 14.1 % (ref 11.5–14.5)
WBC: 4.7 10*3/uL (ref 3.8–10.6)

## 2015-08-03 NOTE — Progress Notes (Signed)
No changes since last visit per pt.  No concerns noted

## 2015-09-15 DIAGNOSIS — R739 Hyperglycemia, unspecified: Secondary | ICD-10-CM | POA: Insufficient documentation

## 2016-02-06 ENCOUNTER — Inpatient Hospital Stay: Payer: Medicare Other | Attending: Oncology

## 2016-02-06 DIAGNOSIS — D461 Refractory anemia with ring sideroblasts: Secondary | ICD-10-CM | POA: Insufficient documentation

## 2016-02-06 DIAGNOSIS — D469 Myelodysplastic syndrome, unspecified: Secondary | ICD-10-CM

## 2016-02-06 LAB — CBC WITH DIFFERENTIAL/PLATELET
BASOS PCT: 1 %
Basophils Absolute: 0 10*3/uL (ref 0–0.1)
Eosinophils Absolute: 0.2 10*3/uL (ref 0–0.7)
Eosinophils Relative: 4 %
HCT: 44.8 % (ref 40.0–52.0)
HEMOGLOBIN: 15.7 g/dL (ref 13.0–18.0)
LYMPHS ABS: 0.9 10*3/uL — AB (ref 1.0–3.6)
Lymphocytes Relative: 19 %
MCH: 38.8 pg — AB (ref 26.0–34.0)
MCHC: 35 g/dL (ref 32.0–36.0)
MCV: 110.9 fL — ABNORMAL HIGH (ref 80.0–100.0)
Monocytes Absolute: 1 10*3/uL (ref 0.2–1.0)
Monocytes Relative: 23 %
NEUTROS PCT: 53 %
Neutro Abs: 2.5 10*3/uL (ref 1.4–6.5)
Platelets: 104 10*3/uL — ABNORMAL LOW (ref 150–440)
RBC: 4.04 MIL/uL — AB (ref 4.40–5.90)
RDW: 14.6 % — ABNORMAL HIGH (ref 11.5–14.5)
WBC: 4.5 10*3/uL (ref 3.8–10.6)

## 2016-08-06 ENCOUNTER — Other Ambulatory Visit: Payer: Medicare Other

## 2016-08-06 ENCOUNTER — Ambulatory Visit: Payer: Medicare Other | Admitting: Oncology

## 2016-08-10 ENCOUNTER — Other Ambulatory Visit: Payer: Self-pay

## 2016-08-10 DIAGNOSIS — D469 Myelodysplastic syndrome, unspecified: Secondary | ICD-10-CM

## 2016-08-12 NOTE — Progress Notes (Signed)
[place]  Telephone:(336) [phone] Fax:(336) [phone]  ID: Joseph Hawkins OB: 08/04/1934  MR#: [id number]  HKV#:[id number]  Patient Care Team: Madelyn Brunner, MD as PCP - General (Internal Medicine)  CHIEF COMPLAINT: MDS, specifically refractory anemia with ringed sideroblasts  INTERVAL HISTORY: Patient returns to clinic for laboratory work and routine evaluation. He continues to feel well and remains asymptomatic.  He has a good appetite and denies weight loss.  He denies any fevers or night sweats.  He denies any easy bleeding or bruising.  He denies any weakness or fatigue.  He has no neurologic complaints.  He denies any chest pain or shortness of breath.  He has no nausea, vomiting, constipation, or diarrhea.  He has no urinary complaints.  Patient offers no specific complaints today.   REVIEW OF SYSTEMS:   Review of Systems  Constitutional: Negative.  Negative for fever, malaise/fatigue and weight loss.  Respiratory: Negative.  Negative for cough and shortness of breath.   Cardiovascular: Negative.  Negative for chest pain.  Gastrointestinal: Negative.  Negative for abdominal pain, blood in stool and melena.  Musculoskeletal: Negative.   Neurological: Negative.  Negative for weakness.  Endo/Heme/Allergies: Does not bruise/bleed easily.  Psychiatric/Behavioral: Negative.  The patient is not nervous/anxious.     As per HPI. Otherwise, a complete review of systems is negative.  PAST MEDICAL HISTORY: Past Medical History:  Diagnosis Date  . Anemia   . Chronic kidney disease   . Hypertension   . MDS (myelodysplastic syndrome) ([place]) 08/02/2015    PAST SURGICAL HISTORY: Past Surgical History:  Procedure Laterality Date  . RADIOLOGY WITH ANESTHESIA N/A 05/07/2013   Procedure: RADIOLOGY WITH ANESTHESIA;  Surgeon: Jacqulynn Cadet, MD;  Location: [facility];  Service: Radiology;  Laterality: N/A;    FAMILY HISTORY Family History  Problem Relation Age of Onset  .  Family history unknown: Yes       ADVANCED DIRECTIVES:    HEALTH MAINTENANCE: Social History  Substance Use Topics  . Smoking status: Never Smoker  . Smokeless tobacco: Never Used  . Alcohol use Yes     Colonoscopy:  PAP:  Bone density:  Lipid panel:  No Known Allergies  Current Outpatient Prescriptions  Medication Sig Dispense Refill  . albuterol (PROVENTIL) (2.5 MG/3ML) 0.083% nebulizer solution Take 3 mLs (2.5 mg total) by nebulization every 6 (six) hours as needed for wheezing or shortness of breath. 75 mL 12  . allopurinol (ZYLOPRIM) 300 MG tablet Take 300 mg by mouth daily.    . cholecalciferol (VITAMIN D) 1000 UNITS tablet Take 1,000 Units by mouth daily.    . ferrous sulfate (FERROUSUL) 325 (65 FE) MG tablet Take 325 mg by mouth daily with breakfast.    . folic acid (FOLVITE) 1 MG tablet Take 1 tablet (1 mg total) by mouth daily.    . furosemide (LASIX) 40 MG tablet Take 1 tablet (40 mg total) by mouth daily. 30 tablet   . levothyroxine (SYNTHROID, LEVOTHROID) 75 MCG tablet Take 75 mcg by mouth daily before breakfast.    . mometasone-formoterol (DULERA) 100-5 MCG/ACT AERO Inhale 2 puffs into the lungs 2 (two) times daily.    . nadolol (CORGARD) 40 MG tablet Take 40 mg by mouth daily.    . pantoprazole (PROTONIX) 40 MG tablet Take 1 tablet (40 mg total) by mouth daily.    Marland Kitchen spironolactone (ALDACTONE) 50 MG tablet Take 50 mg by mouth daily.    . tamsulosin (FLOMAX) 0.4 MG CAPS  capsule Take 1 capsule (0.4 mg total) by mouth daily. 30 capsule   . thiamine 100 MG tablet Take 1 tablet (100 mg total) by mouth daily.     No current facility-administered medications for this visit.     OBJECTIVE: Vitals:   08/13/16 1142  BP: 134/80  Pulse: 64  Resp: 18  Temp: (!) 96.9 F (36.1 C)     Body mass index is 25.87 kg/m.    ECOG FS:0 - Asymptomatic  General: Well-developed, well-nourished, no acute distress. Eyes: Pink conjunctiva, anicteric sclera. Lungs: Clear to  auscultation bilaterally. Heart: Regular rate and rhythm. No rubs, murmurs, or gallops. Abdomen: Soft, nontender, nondistended. No organomegaly noted, normoactive bowel sounds. Musculoskeletal: No edema, cyanosis, or clubbing. Neuro: Alert, answering all questions appropriately. Cranial nerves grossly intact. Skin: No rashes or petechiae noted. Psych: Normal affect.   LAB RESULTS:  Lab Results  Component Value Date   NA 139 05/19/2013   K 4.2 05/19/2013   CL 109 05/19/2013   CO2 20 05/19/2013   GLUCOSE 134 (H) 05/19/2013   BUN 23 05/19/2013   CREATININE 1.17 05/19/2013   CALCIUM 8.4 05/19/2013   PROT 6.3 05/14/2013   ALBUMIN 2.0 (L) 05/14/2013   AST 59 (H) 05/14/2013   ALT 22 05/14/2013   ALKPHOS 337 (H) 05/14/2013   BILITOT 2.3 (H) 05/14/2013   GFRNONAA 58 (L) 05/19/2013   GFRAA 68 (L) 05/19/2013    Lab Results  Component Value Date   WBC 5.0 08/13/2016   NEUTROABS 3.1 08/13/2016   HGB 15.1 08/13/2016   HCT 42.4 08/13/2016   MCV 113.9 (H) 08/13/2016   PLT 103 (L) 08/13/2016     STUDIES: No results found.  ASSESSMENT: MDS, specifically refractory anemia with ringed sideroblasts  PLAN:    1.  MDS: Patients CBC remains stable. Since December of 2009 his platelet count has ranged between 71 and 107.  No intervention is needed at this time. Patient does not require repeat bone marrow biopsy. Return to clinic in 6 months for repeat laboratory work and then in one year for laboratory work and and further evaluation. If his blood counts remain stable at that point, he will be 10 years removed from his diagnosis and possibly could be monitored by his primary care physician. 2.  Elevated MCV: Secondary to underlying MDS.  Approximately 20 minutes was spent in discussion of which greater than 50% was consultation.  Patient expressed understanding and was in agreement with this plan. He also understands that He can call clinic at any time with any questions, concerns, or  complaints.    Lloyd Huger, MD   08/13/2016 11:54 AM

## 2016-08-13 ENCOUNTER — Inpatient Hospital Stay: Payer: Medicare Other | Attending: Oncology

## 2016-08-13 ENCOUNTER — Inpatient Hospital Stay (HOSPITAL_BASED_OUTPATIENT_CLINIC_OR_DEPARTMENT_OTHER): Payer: Medicare Other | Admitting: Oncology

## 2016-08-13 VITALS — BP 134/80 | HR 64 | Temp 96.9°F | Resp 18 | Wt 196.1 lb

## 2016-08-13 DIAGNOSIS — D461 Refractory anemia with ring sideroblasts: Secondary | ICD-10-CM | POA: Insufficient documentation

## 2016-08-13 DIAGNOSIS — I129 Hypertensive chronic kidney disease with stage 1 through stage 4 chronic kidney disease, or unspecified chronic kidney disease: Secondary | ICD-10-CM | POA: Insufficient documentation

## 2016-08-13 DIAGNOSIS — R718 Other abnormality of red blood cells: Secondary | ICD-10-CM | POA: Insufficient documentation

## 2016-08-13 DIAGNOSIS — Z79899 Other long term (current) drug therapy: Secondary | ICD-10-CM | POA: Insufficient documentation

## 2016-08-13 DIAGNOSIS — N189 Chronic kidney disease, unspecified: Secondary | ICD-10-CM | POA: Diagnosis not present

## 2016-08-13 DIAGNOSIS — D469 Myelodysplastic syndrome, unspecified: Secondary | ICD-10-CM

## 2016-08-13 LAB — CBC WITH DIFFERENTIAL/PLATELET
Basophils Absolute: 0 10*3/uL (ref 0–0.1)
Basophils Relative: 1 %
EOS ABS: 0.1 10*3/uL (ref 0–0.7)
Eosinophils Relative: 3 %
HCT: 42.4 % (ref 40.0–52.0)
HEMOGLOBIN: 15.1 g/dL (ref 13.0–18.0)
Lymphocytes Relative: 18 %
Lymphs Abs: 0.9 10*3/uL — ABNORMAL LOW (ref 1.0–3.6)
MCH: 40.6 pg — ABNORMAL HIGH (ref 26.0–34.0)
MCHC: 35.6 g/dL (ref 32.0–36.0)
MCV: 113.9 fL — ABNORMAL HIGH (ref 80.0–100.0)
Monocytes Absolute: 0.8 10*3/uL (ref 0.2–1.0)
Monocytes Relative: 16 %
NEUTROS PCT: 62 %
Neutro Abs: 3.1 10*3/uL (ref 1.4–6.5)
PLATELETS: 103 10*3/uL — AB (ref 150–440)
RBC: 3.73 MIL/uL — ABNORMAL LOW (ref 4.40–5.90)
RDW: 14.8 % — ABNORMAL HIGH (ref 11.5–14.5)
WBC: 5 10*3/uL (ref 3.8–10.6)

## 2017-02-11 ENCOUNTER — Other Ambulatory Visit: Payer: Self-pay | Admitting: *Deleted

## 2017-02-11 ENCOUNTER — Inpatient Hospital Stay: Payer: Medicare HMO | Attending: Oncology

## 2017-02-11 DIAGNOSIS — D461 Refractory anemia with ring sideroblasts: Secondary | ICD-10-CM | POA: Insufficient documentation

## 2017-02-11 DIAGNOSIS — R718 Other abnormality of red blood cells: Secondary | ICD-10-CM | POA: Insufficient documentation

## 2017-02-11 DIAGNOSIS — D649 Anemia, unspecified: Secondary | ICD-10-CM

## 2017-02-11 LAB — CBC WITH DIFFERENTIAL/PLATELET
BASOS ABS: 0.1 10*3/uL (ref 0–0.1)
Basophils Relative: 1 %
EOS PCT: 3 %
Eosinophils Absolute: 0.1 10*3/uL (ref 0–0.7)
HEMATOCRIT: 43.4 % (ref 40.0–52.0)
Hemoglobin: 14.9 g/dL (ref 13.0–18.0)
LYMPHS ABS: 0.9 10*3/uL — AB (ref 1.0–3.6)
Lymphocytes Relative: 16 %
MCH: 40 pg — AB (ref 26.0–34.0)
MCHC: 34.4 g/dL (ref 32.0–36.0)
MCV: 116.2 fL — ABNORMAL HIGH (ref 80.0–100.0)
MONO ABS: 0.9 10*3/uL (ref 0.2–1.0)
MONOS PCT: 17 %
Neutro Abs: 3.5 10*3/uL (ref 1.4–6.5)
Neutrophils Relative %: 63 %
PLATELETS: 115 10*3/uL — AB (ref 150–440)
RBC: 3.73 MIL/uL — ABNORMAL LOW (ref 4.40–5.90)
RDW: 14.8 % — ABNORMAL HIGH (ref 11.5–14.5)
WBC: 5.5 10*3/uL (ref 3.8–10.6)

## 2017-05-02 ENCOUNTER — Inpatient Hospital Stay
Admission: EM | Admit: 2017-05-02 | Discharge: 2017-05-07 | DRG: 871 | Disposition: A | Discharge status: Home Health | Payer: Medicare HMO | Attending: Internal Medicine | Admitting: Internal Medicine

## 2017-05-02 ENCOUNTER — Emergency Department: Payer: Medicare HMO

## 2017-05-02 ENCOUNTER — Encounter: Payer: Self-pay | Admitting: Emergency Medicine

## 2017-05-02 ENCOUNTER — Other Ambulatory Visit: Payer: Self-pay

## 2017-05-02 DIAGNOSIS — E871 Hypo-osmolality and hyponatremia: Secondary | ICD-10-CM | POA: Diagnosis present

## 2017-05-02 DIAGNOSIS — K659 Peritonitis, unspecified: Secondary | ICD-10-CM | POA: Diagnosis present

## 2017-05-02 DIAGNOSIS — K7011 Alcoholic hepatitis with ascites: Secondary | ICD-10-CM | POA: Diagnosis present

## 2017-05-02 DIAGNOSIS — N183 Chronic kidney disease, stage 3 (moderate): Secondary | ICD-10-CM | POA: Diagnosis present

## 2017-05-02 DIAGNOSIS — E1122 Type 2 diabetes mellitus with diabetic chronic kidney disease: Secondary | ICD-10-CM | POA: Diagnosis present

## 2017-05-02 DIAGNOSIS — R748 Abnormal levels of other serum enzymes: Secondary | ICD-10-CM

## 2017-05-02 DIAGNOSIS — I129 Hypertensive chronic kidney disease with stage 1 through stage 4 chronic kidney disease, or unspecified chronic kidney disease: Secondary | ICD-10-CM | POA: Diagnosis present

## 2017-05-02 DIAGNOSIS — D469 Myelodysplastic syndrome, unspecified: Secondary | ICD-10-CM | POA: Diagnosis present

## 2017-05-02 DIAGNOSIS — D684 Acquired coagulation factor deficiency: Secondary | ICD-10-CM | POA: Diagnosis present

## 2017-05-02 DIAGNOSIS — R339 Retention of urine, unspecified: Secondary | ICD-10-CM | POA: Diagnosis present

## 2017-05-02 DIAGNOSIS — A419 Sepsis, unspecified organism: Secondary | ICD-10-CM | POA: Diagnosis present

## 2017-05-02 DIAGNOSIS — K766 Portal hypertension: Secondary | ICD-10-CM | POA: Diagnosis present

## 2017-05-02 DIAGNOSIS — R188 Other ascites: Secondary | ICD-10-CM

## 2017-05-02 DIAGNOSIS — K7031 Alcoholic cirrhosis of liver with ascites: Secondary | ICD-10-CM | POA: Diagnosis present

## 2017-05-02 DIAGNOSIS — D6959 Other secondary thrombocytopenia: Secondary | ICD-10-CM | POA: Diagnosis present

## 2017-05-02 DIAGNOSIS — E872 Acidosis: Secondary | ICD-10-CM | POA: Diagnosis present

## 2017-05-02 DIAGNOSIS — Z79899 Other long term (current) drug therapy: Secondary | ICD-10-CM

## 2017-05-02 DIAGNOSIS — F1010 Alcohol abuse, uncomplicated: Secondary | ICD-10-CM | POA: Diagnosis present

## 2017-05-02 DIAGNOSIS — A4151 Sepsis due to Escherichia coli [E. coli]: Principal | ICD-10-CM | POA: Diagnosis present

## 2017-05-02 DIAGNOSIS — N179 Acute kidney failure, unspecified: Secondary | ICD-10-CM | POA: Diagnosis present

## 2017-05-02 DIAGNOSIS — K8020 Calculus of gallbladder without cholecystitis without obstruction: Secondary | ICD-10-CM | POA: Diagnosis present

## 2017-05-02 LAB — LACTIC ACID, PLASMA
LACTIC ACID, VENOUS: 2.4 mmol/L — AB (ref 0.5–1.9)
LACTIC ACID, VENOUS: 3.5 mmol/L — AB (ref 0.5–1.9)

## 2017-05-02 LAB — COMPREHENSIVE METABOLIC PANEL
ALT: 24 U/L (ref 17–63)
ANION GAP: 11 (ref 5–15)
AST: 67 U/L — ABNORMAL HIGH (ref 15–41)
Albumin: 2 g/dL — ABNORMAL LOW (ref 3.5–5.0)
Alkaline Phosphatase: 235 U/L — ABNORMAL HIGH (ref 38–126)
BILIRUBIN TOTAL: 7.1 mg/dL — AB (ref 0.3–1.2)
BUN: 15 mg/dL (ref 6–20)
CO2: 21 mmol/L — ABNORMAL LOW (ref 22–32)
Calcium: 8.4 mg/dL — ABNORMAL LOW (ref 8.9–10.3)
Chloride: 99 mmol/L — ABNORMAL LOW (ref 101–111)
Creatinine, Ser: 1.61 mg/dL — ABNORMAL HIGH (ref 0.61–1.24)
GFR, EST AFRICAN AMERICAN: 45 mL/min — AB (ref >60)
GFR, EST NON AFRICAN AMERICAN: 38 mL/min — AB (ref >60)
Glucose, Bld: 129 mg/dL — ABNORMAL HIGH (ref 65–99)
Potassium: 3.6 mmol/L (ref 3.5–5.1)
Sodium: 131 mmol/L — ABNORMAL LOW (ref 135–145)
TOTAL PROTEIN: 7.2 g/dL (ref 6.5–8.1)

## 2017-05-02 LAB — CBC WITH DIFFERENTIAL/PLATELET
Basophils Absolute: 0 K/uL (ref 0–0.1)
Basophils Relative: 0 %
Eosinophils Absolute: 0 K/uL (ref 0–0.7)
Eosinophils Relative: 0 %
HCT: 37.1 % — ABNORMAL LOW (ref 40.0–52.0)
Hemoglobin: 12.7 g/dL — ABNORMAL LOW (ref 13.0–18.0)
Lymphocytes Relative: 5 %
Lymphs Abs: 0.6 K/uL — ABNORMAL LOW (ref 1.0–3.6)
MCH: 40.3 pg — ABNORMAL HIGH (ref 26.0–34.0)
MCHC: 34.2 g/dL (ref 32.0–36.0)
MCV: 117.9 fL — ABNORMAL HIGH (ref 80.0–100.0)
Monocytes Absolute: 1.4 K/uL — ABNORMAL HIGH (ref 0.2–1.0)
Monocytes Relative: 11 %
Neutro Abs: 10.1 K/uL — ABNORMAL HIGH (ref 1.4–6.5)
Neutrophils Relative %: 84 %
Platelets: 92 K/uL — ABNORMAL LOW (ref 150–440)
RBC: 3.15 MIL/uL — ABNORMAL LOW (ref 4.40–5.90)
RDW: 16.5 % — ABNORMAL HIGH (ref 11.5–14.5)
WBC: 12.1 K/uL — ABNORMAL HIGH (ref 3.8–10.6)

## 2017-05-02 LAB — PROTIME-INR
INR: 1.87
PROTHROMBIN TIME: 21.4 s — AB (ref 11.4–15.2)

## 2017-05-02 LAB — LIPASE, BLOOD: Lipase: 38 U/L (ref 11–51)

## 2017-05-02 LAB — GLUCOSE, CAPILLARY
Glucose-Capillary: 66 mg/dL (ref 65–99)
Glucose-Capillary: 88 mg/dL (ref 65–99)

## 2017-05-02 LAB — C DIFFICILE QUICK SCREEN W PCR REFLEX
C Diff antigen: NEGATIVE
C Diff interpretation: NOT DETECTED
C Diff toxin: NEGATIVE

## 2017-05-02 LAB — TROPONIN I: Troponin I: <0.03 ng/mL

## 2017-05-02 MED ORDER — TAMSULOSIN HCL 0.4 MG PO CAPS
0.4000 mg | ORAL_CAPSULE | Freq: Every day | ORAL | Status: DC
  Administered 2017-05-02 – 2017-05-03 (×2): 0.4 mg via ORAL
  Filled 2017-05-02 (×2): qty 1

## 2017-05-02 MED ORDER — DOCUSATE SODIUM 100 MG PO CAPS
100.0000 mg | ORAL_CAPSULE | Freq: Two times a day (BID) | ORAL | Status: DC
  Administered 2017-05-03 – 2017-05-07 (×3): 100 mg via ORAL
  Filled 2017-05-02 (×9): qty 1

## 2017-05-02 MED ORDER — TRAZODONE HCL 50 MG PO TABS: 25.0000 mg | ORAL_TABLET | Freq: Every evening | ORAL | Status: DC | PRN

## 2017-05-02 MED ORDER — FERROUS SULFATE 325 (65 FE) MG PO TABS
325.0000 mg | ORAL_TABLET | Freq: Every day | ORAL | Status: DC
  Administered 2017-05-03 – 2017-05-07 (×5): 325 mg via ORAL
  Filled 2017-05-02 (×5): qty 1

## 2017-05-02 MED ORDER — IOPAMIDOL (ISOVUE-300) INJECTION 61%: 30.0000 mL | Freq: Once | INTRAVENOUS | Status: DC | PRN

## 2017-05-02 MED ORDER — VITAMIN D 1000 UNITS PO TABS
1000.0000 [IU] | ORAL_TABLET | Freq: Every day | ORAL | Status: DC
  Administered 2017-05-03 – 2017-05-07 (×5): 1000 [IU] via ORAL
  Filled 2017-05-02 (×5): qty 1

## 2017-05-02 MED ORDER — LORAZEPAM 2 MG/ML IJ SOLN: 0.0000 mg | Freq: Two times a day (BID) | INTRAMUSCULAR | Status: AC

## 2017-05-02 MED ORDER — ACETAMINOPHEN 325 MG PO TABS: 650.0000 mg | ORAL_TABLET | Freq: Four times a day (QID) | ORAL | Status: DC | PRN

## 2017-05-02 MED ORDER — FUROSEMIDE 10 MG/ML IJ SOLN
40.0000 mg | Freq: Once | INTRAMUSCULAR | Status: AC
  Administered 2017-05-02: 40 mg via INTRAVENOUS
  Filled 2017-05-02: qty 4

## 2017-05-02 MED ORDER — IOPAMIDOL (ISOVUE-300) INJECTION 61%
30.0000 mL | Freq: Once | INTRAVENOUS | Status: AC
  Administered 2017-05-02: 30 mL via ORAL

## 2017-05-02 MED ORDER — ONDANSETRON HCL 4 MG PO TABS: 4.0000 mg | ORAL_TABLET | Freq: Four times a day (QID) | ORAL | Status: DC | PRN

## 2017-05-02 MED ORDER — LORAZEPAM 1 MG PO TABS: 1.0000 mg | ORAL_TABLET | Freq: Four times a day (QID) | ORAL | Status: AC | PRN

## 2017-05-02 MED ORDER — SODIUM CHLORIDE 0.9 % IV SOLN
Freq: Once | INTRAVENOUS | Status: AC
  Administered 2017-05-02: 15:00:00 via INTRAVENOUS

## 2017-05-02 MED ORDER — VANCOMYCIN HCL 10 G IV SOLR
1250.0000 mg | Freq: Once | INTRAVENOUS | Status: AC
  Administered 2017-05-02: 1250 mg via INTRAVENOUS
  Filled 2017-05-02: qty 1250

## 2017-05-02 MED ORDER — ONDANSETRON HCL 4 MG/2ML IJ SOLN: 4.0000 mg | Freq: Four times a day (QID) | INTRAMUSCULAR | Status: DC | PRN

## 2017-05-02 MED ORDER — ALBUTEROL SULFATE (2.5 MG/3ML) 0.083% IN NEBU
2.5000 mg | INHALATION_SOLUTION | Freq: Once | RESPIRATORY_TRACT | Status: AC
  Administered 2017-05-02: 2.5 mg via RESPIRATORY_TRACT
  Filled 2017-05-02: qty 3

## 2017-05-02 MED ORDER — BISACODYL 5 MG PO TBEC: 5.0000 mg | DELAYED_RELEASE_TABLET | Freq: Every day | ORAL | Status: DC | PRN

## 2017-05-02 MED ORDER — THIAMINE HCL 100 MG/ML IJ SOLN
100.0000 mg | Freq: Every day | INTRAMUSCULAR | Status: DC
  Administered 2017-05-04 – 2017-05-05 (×2): 100 mg via INTRAVENOUS
  Filled 2017-05-02 (×2): qty 2

## 2017-05-02 MED ORDER — SODIUM CHLORIDE 0.9 % IV BOLUS: 1000.0000 mL | Freq: Once | INTRAVENOUS | Status: DC

## 2017-05-02 MED ORDER — NADOLOL 40 MG PO TABS
40.0000 mg | ORAL_TABLET | Freq: Every day | ORAL | Status: DC
  Administered 2017-05-02: 40 mg via ORAL
  Filled 2017-05-02 (×3): qty 1

## 2017-05-02 MED ORDER — MOMETASONE FURO-FORMOTEROL FUM 100-5 MCG/ACT IN AERO
2.0000 | INHALATION_SPRAY | Freq: Two times a day (BID) | RESPIRATORY_TRACT | Status: DC
  Administered 2017-05-02 – 2017-05-07 (×11): 2 via RESPIRATORY_TRACT
  Filled 2017-05-02: qty 8.8

## 2017-05-02 MED ORDER — PIPERACILLIN-TAZOBACTAM 3.375 G IVPB
3.3750 g | Freq: Three times a day (TID) | INTRAVENOUS | Status: DC
  Administered 2017-05-03: 3.375 g via INTRAVENOUS
  Filled 2017-05-02: qty 50

## 2017-05-02 MED ORDER — VITAMIN B-1 100 MG PO TABS
100.0000 mg | ORAL_TABLET | Freq: Every day | ORAL | Status: DC
  Administered 2017-05-03 – 2017-05-07 (×4): 100 mg via ORAL
  Filled 2017-05-02 (×5): qty 1

## 2017-05-02 MED ORDER — FOLIC ACID 1 MG PO TABS
1.0000 mg | ORAL_TABLET | Freq: Every day | ORAL | Status: DC
  Administered 2017-05-03 – 2017-05-07 (×5): 1 mg via ORAL
  Filled 2017-05-02 (×5): qty 1

## 2017-05-02 MED ORDER — LORAZEPAM 2 MG/ML IJ SOLN: 0.0000 mg | Freq: Four times a day (QID) | INTRAMUSCULAR | Status: AC

## 2017-05-02 MED ORDER — THIAMINE HCL 100 MG PO TABS: 100.0000 mg | ORAL_TABLET | Freq: Every day | ORAL | Status: DC

## 2017-05-02 MED ORDER — IPRATROPIUM-ALBUTEROL 0.5-2.5 (3) MG/3ML IN SOLN
RESPIRATORY_TRACT | Status: AC
  Filled 2017-05-02: qty 3

## 2017-05-02 MED ORDER — HYDROCODONE-ACETAMINOPHEN 5-325 MG PO TABS: 1.0000 | ORAL_TABLET | ORAL | Status: DC | PRN

## 2017-05-02 MED ORDER — IOHEXOL 300 MG/ML  SOLN
80.0000 mL | Freq: Once | INTRAMUSCULAR | Status: AC | PRN
  Administered 2017-05-02: 80 mL via INTRAVENOUS

## 2017-05-02 MED ORDER — VANCOMYCIN HCL 10 G IV SOLR
1250.0000 mg | INTRAVENOUS | Status: DC
  Administered 2017-05-03: 1250 mg via INTRAVENOUS
  Filled 2017-05-02 (×2): qty 1250

## 2017-05-02 MED ORDER — ACETAMINOPHEN 650 MG RE SUPP: 650.0000 mg | Freq: Four times a day (QID) | RECTAL | Status: DC | PRN

## 2017-05-02 MED ORDER — LEVOTHYROXINE SODIUM 50 MCG PO TABS
75.0000 ug | ORAL_TABLET | Freq: Every day | ORAL | Status: DC
  Administered 2017-05-03 – 2017-05-07 (×5): 75 ug via ORAL
  Filled 2017-05-02 (×5): qty 2

## 2017-05-02 MED ORDER — ALBUTEROL SULFATE (2.5 MG/3ML) 0.083% IN NEBU: 2.5000 mg | INHALATION_SOLUTION | Freq: Four times a day (QID) | RESPIRATORY_TRACT | Status: DC | PRN

## 2017-05-02 MED ORDER — ALLOPURINOL 100 MG PO TABS
300.0000 mg | ORAL_TABLET | Freq: Every day | ORAL | Status: DC
  Administered 2017-05-03 – 2017-05-07 (×5): 300 mg via ORAL
  Filled 2017-05-02 (×5): qty 3

## 2017-05-02 MED ORDER — ADULT MULTIVITAMIN W/MINERALS CH
1.0000 | ORAL_TABLET | Freq: Every day | ORAL | Status: DC
  Administered 2017-05-03 – 2017-05-07 (×5): 1 via ORAL
  Filled 2017-05-02 (×5): qty 1

## 2017-05-02 MED ORDER — LORAZEPAM 2 MG/ML IJ SOLN: 1.0000 mg | Freq: Four times a day (QID) | INTRAMUSCULAR | Status: AC | PRN

## 2017-05-02 MED ORDER — PIPERACILLIN-TAZOBACTAM 3.375 G IVPB 30 MIN
3.3750 g | Freq: Once | INTRAVENOUS | Status: AC
  Administered 2017-05-02: 3.375 g via INTRAVENOUS
  Filled 2017-05-02: qty 50

## 2017-05-02 MED ORDER — FOLIC ACID 1 MG PO TABS: 1.0000 mg | ORAL_TABLET | Freq: Every day | ORAL | Status: DC

## 2017-05-02 MED ORDER — HEPARIN SODIUM (PORCINE) 5000 UNIT/ML IJ SOLN
5000.0000 [IU] | Freq: Three times a day (TID) | INTRAMUSCULAR | Status: DC
  Administered 2017-05-02 – 2017-05-03 (×2): 5000 [IU] via SUBCUTANEOUS
  Filled 2017-05-02 (×2): qty 1

## 2017-05-02 NOTE — ED Provider Notes (Signed)
[facility] Emergency Department Provider Note   ____________________________________________   First MD Initiated Contact with Patient 05/02/17 1430     (approximate)  I have reviewed the triage vital signs and the nursing notes.   HISTORY  Chief Complaint Fever; Diarrhea; and Cough   HPI Joseph Hawkins is a 83 y.o. male Patient sick for at least the last 2 days with a fever and nonproductive cough and diarrhea. The diarrhea has been several times today already. She denies any chest or belly pain. He has had shortness of breath in the past and some edema for the last month. Patient's belly is large today to metastases fairly new although it's nontender.   Past Medical History:  Diagnosis Date  . Anemia   . Chronic kidney disease   . Hypertension   . MDS (myelodysplastic syndrome) ([place]) 08/02/2015    Patient Active Problem List   Diagnosis Date Noted  . Sepsis ([place]) 05/02/2017  . MDS (myelodysplastic syndrome) ([place]) 08/02/2015  . Atrial fibrillation with RVR ([place]) 05/13/2013  . Diarrhea 05/13/2013  . Esophageal varices in cirrhosis ([place]) 05/06/2013  . Anemia 05/06/2013  . UGIB (upper gastrointestinal bleed) 05/06/2013  . Hemorrhagic shock ([place]) 05/06/2013  . Alcohol withdrawal ([place]) 05/06/2013  . Acute renal failure ([place]) 05/06/2013  . Alcoholic cirrhosis ([place]) 81/01/7508  . Coagulopathy ([place]) 05/06/2013  . Acute respiratory failure ([place]) 05/06/2013    Past Surgical History:  Procedure Laterality Date  . RADIOLOGY WITH ANESTHESIA N/A 05/07/2013   Procedure: RADIOLOGY WITH ANESTHESIA;  Surgeon: Jacqulynn Cadet, MD;  Location: [place];  Service: Radiology;  Laterality: N/A;    Prior to Admission medications   Medication Sig Start Date End Date Taking? Authorizing Provider  allopurinol (ZYLOPRIM) 300 MG tablet Take 300 mg by mouth daily.   Yes [provider]  cholecalciferol (VITAMIN D) 1000 UNITS tablet Take 1,000 Units by mouth daily.    Yes [provider]  ferrous sulfate (FERROUSUL) 325 (65 FE) MG tablet Take 325 mg by mouth daily with breakfast.   Yes [provider]  folic acid (FOLVITE) 1 MG tablet Take 1 tablet (1 mg total) by mouth daily. 05/20/13  Yes Delfina Redwood, MD  furosemide (LASIX) 40 MG tablet Take 1 tablet (40 mg total) by mouth daily. 05/20/13  Yes Delfina Redwood, MD  levothyroxine (SYNTHROID, LEVOTHROID) 75 MCG tablet Take 75 mcg by mouth daily before breakfast.   Yes [provider]  nadolol (CORGARD) 40 MG tablet Take 40 mg by mouth daily.   Yes [provider]  pantoprazole (PROTONIX) 40 MG tablet Take 1 tablet (40 mg total) by mouth daily. Patient taking differently: Take 40 mg by mouth 2 (two) times daily.  05/20/13  Yes Delfina Redwood, MD  spironolactone (ALDACTONE) 50 MG tablet Take 50 mg by mouth daily.   Yes [provider]  thiamine 100 MG tablet Take 1 tablet (100 mg total) by mouth daily. 05/20/13  Yes Delfina Redwood, MD  albuterol (PROVENTIL) (2.5 MG/3ML) 0.083% nebulizer solution Take 3 mLs (2.5 mg total) by nebulization every 6 (six) hours as needed for wheezing or shortness of breath. Patient not taking: Reported on 05/02/2017 05/20/13   Delfina Redwood, MD  mometasone-formoterol [facility]) 100-5 MCG/ACT AERO Inhale 2 puffs into the lungs 2 (two) times daily. Patient not taking: Reported on 05/02/2017 05/20/13   Delfina Redwood, MD  tamsulosin (FLOMAX) 0.4 MG CAPS capsule Take 1 capsule (0.4 mg total) by  mouth daily. Patient not taking: Reported on 05/02/2017 05/20/13   Delfina Redwood, MD    Allergies Patient has no known allergies.  Family History  Family history unknown: Yes    Social History Social History   Tobacco Use  . Smoking status: Never Smoker  . Smokeless tobacco: Never Used  Substance Use Topics  . Alcohol use: Yes  . Drug use: No    Review of Systems  Constitutional: subjectively No  fever/chills Eyes: No visual changes. ENT: No sore throat. Cardiovascular: Denies chest pain. Respiratory: Denies shortness of breath. Gastrointestinal: No abdominal pain.  No nausea, no vomiting.  diarrhea.  No constipation. Genitourinary: Negative for dysuria. Musculoskeletal: Negative for back pain. Skin: Negative for rash. Neurological: Negative for headaches, focal weakness  ____________________________________________   PHYSICAL EXAM:  VITAL SIGNS: ED Triage Vitals  Enc Vitals Group     BP 05/02/17 1344 (!) 104/59     Pulse Rate 05/02/17 1344 85     Resp 05/02/17 1344 18     Temp 05/02/17 1344 (!) 100.8 F (38.2 C)     Temp Source 05/02/17 1344 Oral     SpO2 05/02/17 1344 95 %     Weight 05/02/17 1345 206 lb (93.4 kg)     Height 05/02/17 1345 6\' 1"  (1.854 m)     Head Circumference --      Peak Flow --      Pain Score 05/02/17 1345 0     Pain Loc --      Pain Edu? --      Excl. in [place]? --     Constitutional: Alert and oriented. Well appearing and in no acute distress. Eyes: Conjunctivae looked slightly yellow  Head: Atraumatic. Nose: No congestion/rhinnorhea. Mouth/Throat: Mucous membranes are moist.  Oropharynx non-erythematous. Neck: No stridor.  Cardiovascular: Normal rate, regular rhythm. Grossly normal heart sounds.  Good peripheral circulation. Respiratory: Normal respiratory effort.  No retractions. Lungs CTAB. Gastrointestinal: Soft and nontender. distended. No abdominal bruits. No CVA tenderness. Musculoskeletal: No lower extremity tenderness 1+ edema.  No joint effusions. Neurologic:  Normal speech and language. No gross focal neurologic deficits are appreciated. No gait instability. Skin:  Skin is warm, dry and intact. No rash noted. Psychiatric: Mood and affect are normal. Speech and behavior are normal.  ____________________________________________   LABS (all labs ordered are listed, but only abnormal results are displayed)  Labs Reviewed   COMPREHENSIVE METABOLIC PANEL - Abnormal; Notable for the following components:      Result Value   Sodium 131 (*)    Chloride 99 (*)    CO2 21 (*)    Glucose, Bld 129 (*)    Creatinine, Ser 1.61 (*)    Calcium 8.4 (*)    Albumin 2.0 (*)    AST 67 (*)    Alkaline Phosphatase 235 (*)    Total Bilirubin 7.1 (*)    GFR calc non Af Amer 38 (*)    GFR calc Af Amer 45 (*)    All other components within normal limits  LACTIC ACID, PLASMA - Abnormal; Notable for the following components:   Lactic Acid, Venous 2.4 (*)    All other components within normal limits  LACTIC ACID, PLASMA - Abnormal; Notable for the following components:   Lactic Acid, Venous 3.5 (*)    All other components within normal limits  CBC WITH DIFFERENTIAL/PLATELET - Abnormal; Notable for the following components:   WBC 12.1 (*)    RBC 3.15 (*)  Hemoglobin 12.7 (*)    HCT 37.1 (*)    MCV 117.9 (*)    MCH 40.3 (*)    RDW 16.5 (*)    Platelets 92 (*)    Neutro Abs 10.1 (*)    Lymphs Abs 0.6 (*)    Monocytes Absolute 1.4 (*)    All other components within normal limits  CULTURE, BLOOD (ROUTINE X 2)  CULTURE, BLOOD (ROUTINE X 2)  C DIFFICILE QUICK SCREEN W PCR REFLEX  GASTROINTESTINAL PANEL BY PCR, STOOL (REPLACES STOOL CULTURE)  LIPASE, BLOOD  TROPONIN I  URINALYSIS, COMPLETE (UACMP) WITH MICROSCOPIC  URINALYSIS, ROUTINE W REFLEX MICROSCOPIC  BASIC METABOLIC PANEL  CBC  URINE DRUG SCREEN, QUALITATIVE ([facility] ONLY)  PROTIME-INR  I-STAT CG4 LACTIC ACID, ED  I-STAT CG4 LACTIC ACID, ED   ____________________________________________  EKG   ____________________________________________  RADIOLOGY  ED MD interpretation:    Official radiology report(s): Dg Chest 2 View  Result Date: 05/02/2017 CLINICAL DATA:  Fever, cough, diarrhea for 2 days EXAM: CHEST - 2 VIEW COMPARISON:  Portable chest x-ray of 05/16/2013 FINDINGS: No active infiltrate or effusion is seen. The lungs are not optimally aerated.  Mediastinal and hilar contours are unremarkable. The heart is mildly enlarged. No acute bony abnormality is seen. IMPRESSION: No active lung disease.  Borderline to mild cardiomegaly. Electronically Signed   By: Ivar Drape M.D.   On: 05/02/2017 15:21   Ct Abdomen Pelvis W Contrast  Result Date: 05/02/2017 CLINICAL DATA:  83 year old male with fever, chills and diarrhea EXAM: CT ABDOMEN AND PELVIS WITH CONTRAST TECHNIQUE: Multidetector CT imaging of the abdomen and pelvis was performed using the standard protocol following bolus administration of intravenous contrast. CONTRAST:  15mL OMNIPAQUE IOHEXOL 300 MG/ML  SOLN COMPARISON:  Prior CT scan of the abdomen and pelvis 05/09/2013 FINDINGS: Lower chest: The lung bases are clear. Visualized cardiac structures are within normal limits for size. No pericardial effusion. Unremarkable visualized distal thoracic esophagus. Hepatobiliary: The liver is relatively small and demonstrates a diffusely nodular contour. There is relative hypertrophy of the left and caudate lobes and atrophy of the right hepatic lobe. No enhancing mass. The portal veins remain patent. Stones are present within the gallbladder. No gallbladder dilatation or wall thickening. No biliary ductal dilatation. Pancreas: Unremarkable. No pancreatic ductal dilatation or surrounding inflammatory changes. Spleen: Borderline splenomegaly. Adrenals/Urinary Tract: Unremarkable adrenal glands. No enhancing renal mass, hydronephrosis or nephrolithiasis. Ureters are unremarkable. Bladder is only partially distended but unremarkable for degree of distention. Stomach/Bowel: No evidence of obstruction or focal bowel wall thickening. Vascular/Lymphatic: Atherosclerotic calcifications present throughout the abdominal aorta. No evidence of aneurysm. No evidence of a recurrent gastric varices. Metallic coils are present in the remnant of a left gastro renal shunt. The visceral veins are patent. Reproductive: Prostate is  unremarkable. Other: Large volume ascites. Musculoskeletal: No acute fracture or aggressive appearing lytic or blastic osseous lesion. IMPRESSION: 1. Cirrhosis with portal hypertension and large volume ascites. 2. 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 No evidence of recurrent or residual gastric varices. 3. Cholelithiasis. 4.  Aortic Atherosclerosis (ICD10-170.0). Electronically Signed   By: Jacqulynn Cadet M.D.   On: 05/02/2017 16:31   US Abdomen Limited Ruq  Result Date: 05/02/2017 CLINICAL DATA:  Elevated alkaline phosphatase. Bilirubin 7.1. Abdominal distention. History of cirrhosis. EXAM: ULTRASOUND ABDOMEN LIMITED RIGHT UPPER QUADRANT COMPARISON:  CT of the abdomen and pelvis on 05/02/2017 FINDINGS: Gallbladder: Gallbladder is filled with stones and contracted around the stones.  Largest stone measures approximately 11 millimeters. Gallbladder wall is thickened, 5.4 millimeters. No sonographic Murphy sign identified. Common bile duct: Diameter: 3.5 millimeters Liver: No focal lesion identified. Within normal limits in parenchymal echogenicity. Marked nodular contour the liver consistent with cirrhosis. No focal liver lesions are identified. Additional: Moderate volume ascites. IMPRESSION: 1. Cirrhotic changes in the liver. 2. Ascites. 3. Cholelithiasis and thickened gallbladder wall. No other evidence for acute cholecystitis. Electronically Signed   By: [name] M.D.   On: 05/02/2017 17:46    ____________________________________________   PROCEDURES  Procedure(s) performed:   Procedures  Critical Care performed:   ____________________________________________   INITIAL IMPRESSION / ASSESSMENT AND PLAN / ED COURSE  patient was a fever with no apparent source although we don't have a urine back yet low blood pressure tachycardia will treat him for possible sepsis. Have to be very careful with his fluid as he became more  hypoxic with a little bit of fluid. Hospitalist's is aware. Patient is very sick.      Clinical Course as of May 02 2001  Thu May 02, 2017  1753 WBC(!): 12.1 [PM]    Clinical Course User Index [PM] Nena Polio, MD     ____________________________________________   FINAL CLINICAL IMPRESSION(S) / ED DIAGNOSES  Final diagnoses:  AKI (acute kidney injury) [facility])  possible sepsis   ED Discharge Orders    None       Note:  This document was prepared using Dragon voice recognition software and may include unintentional dictation errors.    Nena Polio, MD 05/02/17 2004

## 2017-05-02 NOTE — ED Notes (Signed)
Pt had ambulated to toilet on return from ultrasound and attempted stool sample. Pt urinated and had very small bm in the hat with blood noted - unsure if from urine or from bowel. Sample contaminated and not sent. Once back in bed pt was sob from exertion and very unsteady on his feet.

## 2017-05-02 NOTE — ED Notes (Signed)
Pt moved over from fast track to room 19. Hx of having his abd tapped - abd tight and swollen as is his legs. Original c/o is fever, non-productive cough and diarrhea.

## 2017-05-02 NOTE — ED Triage Notes (Signed)
Pt to ED with c/o of fever, non-productive cough and diarrhea that has been going on for 2 days.

## 2017-05-02 NOTE — ED Notes (Signed)
Pt moved to room 19  Report called to [employer]

## 2017-05-02 NOTE — ED Notes (Signed)
Stool sample obtained while pt on bedpan. Urinated on a towel between his legs so urine sample not obtained. Hematuria noted - dr aware.

## 2017-05-02 NOTE — ED Notes (Signed)
Pt given urinal and hat placed in toilet.

## 2017-05-02 NOTE — ED Notes (Addendum)
Report called to 2c - dawn

## 2017-05-02 NOTE — ED Notes (Signed)
2nd blood culture obtained but unable to establish the iv.

## 2017-05-02 NOTE — Consult Note (Addendum)
Pharmacy Antibiotic Note  Joseph Hawkins is a 83 y.o. male admitted on 05/02/2017 with sepsis and intra-abdominal infection.  Pharmacy has been consulted for zosyn dosing. Vancomycin added by hospitalist.  Plan: Zosyn 3.375g IV q8h (4 hour infusion). Changed to meropenem for BCID (+) E. coli  DW 94kg  VD 66L kei 0.04 hr-1  T1/2 17 hrs Vancomycin 1250 mg q 18 hours ordered with stacked dosing. Level before 5th dose. Goal trough 15-20  Height: 6\' 1"  (185.4 cm) Weight: 206 lb (93.4 kg) IBW/kg (Calculated) : 79.9  Temp (24hrs), Avg:100.8 F (38.2 C), Min:100.8 F (38.2 C), Max:100.8 F (38.2 C)  Recent Labs  Lab 05/02/17 1452 05/02/17 1453 05/02/17 1903  WBC 12.1*  --   --   CREATININE 1.61*  --   --   LATICACIDVEN  --  2.4* 3.5*    Estimated Creatinine Clearance: 40.7 mL/min (A) (by C-G formula based on SCr of 1.61 mg/dL (H)).    No Known Allergies  Antimicrobials this admission: Zosyn, vancomycin 5/2 >> meropenem 5/3   Dose adjustments this admission:   Microbiology results: 5/2 BCx:  5/3 BCID: 3/4 E. coli, KPC (-) 5/2 cdiff: 5/2 GI panel  Thank you for allowing pharmacy to be a part of this patient's care.  Ramond Dial, Pharm.D, BCPS Clinical Pharmacist  05/02/2017 7:43 PM

## 2017-05-02 NOTE — Progress Notes (Signed)
CODE SEPSIS - PHARMACY COMMUNICATION  **Broad Spectrum Antibiotics should be administered within 1 hour of Sepsis diagnosis**  Time Code Sepsis Called/Page Received: 1658  Antibiotics Ordered: Zosyn  Time of 1st antibiotic administration: 1610  Additional action taken by pharmacy: spoke with nursing about 1715 who informed us they were getting a line for the patient  If necessary, Name of Provider/Nurse Contacted: N/A    Joseph Hawkins ,PharmD Clinical Pharmacist  05/02/2017  5:58 PM

## 2017-05-02 NOTE — H&P (Addendum)
[facility] NAME: Joseph Hawkins    MR#:  [id number]  DATE OF BIRTH:  April 06, 1934  DATE OF ADMISSION:  05/02/2017  PRIMARY CARE PHYSICIAN: Baxter Hire, MD   REQUESTING/REFERRING PHYSICIAN: Dr. Conni Slipper  CHIEF COMPLAINT: Fever   Chief Complaint  Patient presents with  . Fever  . Diarrhea  . Cough    HISTORY OF PRESENT ILLNESS:  Joseph Hawkins  is a 83 y.o. male with a known history of diabetes, liver cirrhosis came in for fever, nonproductive cough for 2 days /also has diarrhea patient went 4 times today mainly watery stool.  No nausea.  Does have ascites and getting more distended also with worsening leg edema.  Received 250 cc fluid and then started to have wheezing.  Noted to have sepsis with elevated lactic acid 2.4.  Patient last drink was yesterday.  Has history of heavy drinking drinks about 2 to 3 cans of liquor every day.  PAST MEDICAL HISTORY:   Past Medical History:  Diagnosis Date  . Anemia   . Chronic kidney disease   . Hypertension   . MDS (myelodysplastic syndrome) ([place]) 08/02/2015    PAST SURGICAL HISTOIRY:   Past Surgical History:  Procedure Laterality Date  . RADIOLOGY WITH ANESTHESIA N/A 05/07/2013   Procedure: RADIOLOGY WITH ANESTHESIA;  Surgeon: Jacqulynn Cadet, MD;  Location: [facility];  Service: Radiology;  Laterality: N/A;    SOCIAL HISTORY:   Social History   Tobacco Use  . Smoking status: Never Smoker  . Smokeless tobacco: Never Used  Substance Use Topics  . Alcohol use: Yes    FAMILY HISTORY:   Family History  Family history unknown: Yes    DRUG ALLERGIES:  No Known Allergies  REVIEW OF SYSTEMS:  CONSTITUTIONAL: Fever, shortness of breath, diarrhea. EYES: No blurred or double vision.  EARS, NOSE, AND THROAT: No tinnitus or ear pain.  RESPIRATORY: Cough, shortness of breath wheezing or hemoptysis.  CARDIOVASCULAR: No chest pain, orthopnea, edema.  GASTROINTESTINAL: No nausea,  vomiting, diarrhea since yesterday.  Has abdominal swelling, pedal edema.  GENITOURINARY: No dysuria, hematuria.  ENDOCRINE: No polyuria, nocturia,  HEMATOLOGY: Anemia,  sKIN: No rash or lesion. MUSCULOSKELETAL: No joint pain or arthritis.   NEUROLOGIC: No tingling, numbness, weakness.  PSYCHIATRY: No anxiety or depression.   MEDICATIONS AT HOME:   Prior to Admission medications   Medication Sig Start Date End Date Taking? Authorizing Provider  allopurinol (ZYLOPRIM) 300 MG tablet Take 300 mg by mouth daily.   Yes [provider]  cholecalciferol (VITAMIN D) 1000 UNITS tablet Take 1,000 Units by mouth daily.   Yes [provider]  ferrous sulfate (FERROUSUL) 325 (65 FE) MG tablet Take 325 mg by mouth daily with breakfast.   Yes [provider]  folic acid (FOLVITE) 1 MG tablet Take 1 tablet (1 mg total) by mouth daily. 05/20/13  Yes Delfina Redwood, MD  furosemide (LASIX) 40 MG tablet Take 1 tablet (40 mg total) by mouth daily. 05/20/13  Yes Delfina Redwood, MD  levothyroxine (SYNTHROID, LEVOTHROID) 75 MCG tablet Take 75 mcg by mouth daily before breakfast.   Yes [provider]  nadolol (CORGARD) 40 MG tablet Take 40 mg by mouth daily.   Yes [provider]  pantoprazole (PROTONIX) 40 MG tablet Take 1 tablet (40 mg total) by mouth daily. Patient taking differently: Take 40 mg by mouth 2 (two) times daily.  05/20/13  Yes Doree Barthel  L, MD  spironolactone (ALDACTONE) 50 MG tablet Take 50 mg by mouth daily.   Yes [provider]  thiamine 100 MG tablet Take 1 tablet (100 mg total) by mouth daily. 05/20/13  Yes Delfina Redwood, MD  albuterol (PROVENTIL) (2.5 MG/3ML) 0.083% nebulizer solution Take 3 mLs (2.5 mg total) by nebulization every 6 (six) hours as needed for wheezing or shortness of breath. Patient not taking: Reported on 05/02/2017 05/20/13   Delfina Redwood, MD  mometasone-formoterol [facility]) 100-5 MCG/ACT AERO Inhale  2 puffs into the lungs 2 (two) times daily. Patient not taking: Reported on 05/02/2017 05/20/13   Delfina Redwood, MD  tamsulosin (FLOMAX) 0.4 MG CAPS capsule Take 1 capsule (0.4 mg total) by mouth daily. Patient not taking: Reported on 05/02/2017 05/20/13   Delfina Redwood, MD      VITAL SIGNS:  Blood pressure 125/84, pulse (!) 109, temperature (!) 100.8 F (38.2 C), temperature source Oral, resp. rate (!) 21, height 6' 1"  (1.854 m), weight 93.4 kg (206 lb), SpO2 98 %.  PHYSICAL EXAMINATION:  GENERAL:  83 y.o.-year-old patient lying in the bed is uncomfortable says that he is having trouble breathing because of abdominal swelling.   EYES: Pupils equal, round, reactive to light and accommodation has some scleral icterus.. Extraocular muscles intact.  HEENT: Head atraumatic, normocephalic. Oropharynx and nasopharynx clear.  NECK:  Supple, no jugular venous distention. No thyroid enlargement, no tenderness.  LUNGS:  decreased breath sounds bilaterally at bases CARDIOVASCULAR: S1, S2 normal. No murmurs, rubs, or gallops.  ABDOMEN:  ascites, fluid thrill present EXTREMITIES: ` 2+ pitting edema bilaterally. NEUROLOGIC: Cranial nerves II through XII are intact. Muscle strength 5/5 in all extremities. Sensation intact. Gait not checked.  PSYCHIATRIC: The patient is alert and oriented x 3.  SKIN: No obvious rash, lesion, or ulcer.   LABORATORY PANEL:   CBC Recent Labs  Lab 05/02/17 1452  WBC 12.1*  HGB 12.7*  HCT 37.1*  PLT 92*   ------------------------------------------------------------------------------------------------------------------  Chemistries  Recent Labs  Lab 05/02/17 1452  NA 131*  K 3.6  CL 99*  CO2 21*  GLUCOSE 129*  BUN 15  CREATININE 1.61*  CALCIUM 8.4*  AST 67*  ALT 24  ALKPHOS 235*  BILITOT 7.1*   ------------------------------------------------------------------------------------------------------------------  Cardiac Enzymes Recent Labs  Lab  05/02/17 1452  TROPONINI <0.03   ------------------------------------------------------------------------------------------------------------------  RADIOLOGY:  Dg Chest 2 View  Result Date: 05/02/2017 CLINICAL DATA:  Fever, cough, diarrhea for 2 days EXAM: CHEST - 2 VIEW COMPARISON:  Portable chest x-ray of 05/16/2013 FINDINGS: No active infiltrate or effusion is seen. The lungs are not optimally aerated. Mediastinal and hilar contours are unremarkable. The heart is mildly enlarged. No acute bony abnormality is seen. IMPRESSION: No active lung disease.  Borderline to mild cardiomegaly. Electronically Signed   By: Ivar Drape M.D.   On: 05/02/2017 15:21   Ct Abdomen Pelvis W Contrast  Result Date: 05/02/2017 CLINICAL DATA:  83 year old male with fever, chills and diarrhea EXAM: CT ABDOMEN AND PELVIS WITH CONTRAST TECHNIQUE: Multidetector CT imaging of the abdomen and pelvis was performed using the standard protocol following bolus administration of intravenous contrast. CONTRAST:  62m OMNIPAQUE IOHEXOL 300 MG/ML  SOLN COMPARISON:  Prior CT scan of the abdomen and pelvis 05/09/2013 FINDINGS: Lower chest: The lung bases are clear. Visualized cardiac structures are within normal limits for size. No pericardial effusion. Unremarkable visualized distal thoracic esophagus. Hepatobiliary: The liver is relatively small and demonstrates a diffusely nodular contour.  There is relative hypertrophy of the left and caudate lobes and atrophy of the right hepatic lobe. No enhancing mass. The portal veins remain patent. Stones are present within the gallbladder. No gallbladder dilatation or wall thickening. No biliary ductal dilatation. Pancreas: Unremarkable. No pancreatic ductal dilatation or surrounding inflammatory changes. Spleen: Borderline splenomegaly. Adrenals/Urinary Tract: Unremarkable adrenal glands. No enhancing renal mass, hydronephrosis or nephrolithiasis. Ureters are unremarkable. Bladder is only  partially distended but unremarkable for degree of distention. Stomach/Bowel: No evidence of obstruction or focal bowel wall thickening. Vascular/Lymphatic: Atherosclerotic calcifications present throughout the abdominal aorta. No evidence of aneurysm. No evidence of a recurrent gastric varices. Metallic coils are present in the remnant of a left gastro renal shunt. The visceral veins are patent. Reproductive: Prostate is unremarkable. Other: Large volume ascites. Musculoskeletal: No acute fracture or aggressive appearing lytic or blastic osseous lesion. IMPRESSION: 1. Cirrhosis with portal hypertension and large volume ascites. 2. 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 No evidence of recurrent or residual gastric varices. 3. Cholelithiasis. 4.  Aortic Atherosclerosis (ICD10-170.0). Electronically Signed   By: Jacqulynn Cadet M.D.   On: 05/02/2017 16:31   US Abdomen Limited Ruq  Result Date: 05/02/2017 CLINICAL DATA:  Elevated alkaline phosphatase. Bilirubin 7.1. Abdominal distention. History of cirrhosis. EXAM: ULTRASOUND ABDOMEN LIMITED RIGHT UPPER QUADRANT COMPARISON:  CT of the abdomen and pelvis on 05/02/2017 FINDINGS: Gallbladder: Gallbladder is filled with stones and contracted around the stones. Largest stone measures approximately 11 millimeters. Gallbladder wall is thickened, 5.4 millimeters. No sonographic Murphy sign identified. Common bile duct: Diameter: 3.5 millimeters Liver: No focal lesion identified. Within normal limits in parenchymal echogenicity. Marked nodular contour the liver consistent with cirrhosis. No focal liver lesions are identified. Additional: Moderate volume ascites. IMPRESSION: 1. Cirrhotic changes in the liver. 2. Ascites. 3. Cholelithiasis and thickened gallbladder wall. No other evidence for acute cholecystitis. Electronically Signed   By: [name] M.D.   On: 05/02/2017 17:46    EKG:   Orders placed  or performed during the hospital encounter of 05/02/17  . ED EKG  . ED EKG    IMPRESSION AND PLAN:   83year-old male patient with heavy alcohol abuse, liver cirrhosis, ascites previous history of paracentesis years ago comes in with fever, dry cough, diarrhea. #1 sepsis likely source is intra-abdominal: Admit to hospital to [place] bed with off unit telemetry, start IV Zosyn, follow blood cultures that is done in the emergency room.  Limit fluid resuscitation because patient has severe abdominal distention with moderate ascites. 2.  Acute kidney injury, possible hepatorenal syndrome: Obtain nephrology consult, start Lasix, Aldactone from tomorrow 3.  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 4.  Heavy alcohol abuse, last alcohol yesterday so continue CIWA protocol. 5.  Anemia, thrombocytopenia due for colic liver cirrhosis. 6 diarrhea: Check stool for C. difficile. #7, cholelithiasis: Asymptomatic.   8 j.aundice, total bilirubin 7.1, alk phos 235.  Obtain PT/INR to calculate DF factor  to start patient on steroids.\ High risk for mortality secondary to his liver cirrhosis with portal hypertension, jaundice, possible hepatorenal syndrome now has sepsis. All the records are reviewed and case discussed with ED provider. Management plans discussed with the patient, family and they are in agreement.  CODE STATUS: Full code  TOTAL TIME TAKING CARE OF THIS PATIENT: 55 minutes.    Epifanio Lesches M.D on 05/02/2017 at 6:39 PM  Between 7am to 6pm - Pager - [phone]  After 6pm go to [email or web address] - password [email or web address] [facility]  Office  [phone]  CC: Primary care physician; Baxter Hire, MD  Note: This dictation was prepared with Dragon dictation along with smaller phrase technology. Any transcriptional errors that result from this process are unintentional.

## 2017-05-02 NOTE — ED Notes (Signed)
See triage note  Presents with chills and diarrhea which started last pm  Fever this am  Also swelling noted to both lower ext. And across abd    Hx of same in past  SOB with exertion on arrival

## 2017-05-02 NOTE — ED Notes (Signed)
Pt wheezing after 250cc bolus. Albuterol ordered.

## 2017-05-03 ENCOUNTER — Inpatient Hospital Stay: Payer: Medicare HMO

## 2017-05-03 DIAGNOSIS — K7031 Alcoholic cirrhosis of liver with ascites: Secondary | ICD-10-CM

## 2017-05-03 LAB — CBC
HCT: 34.9 % — ABNORMAL LOW (ref 40.0–52.0)
HEMOGLOBIN: 12.1 g/dL — AB (ref 13.0–18.0)
MCH: 40.8 pg — AB (ref 26.0–34.0)
MCHC: 34.5 g/dL (ref 32.0–36.0)
MCV: 118.2 fL — ABNORMAL HIGH (ref 80.0–100.0)
Platelets: 76 10*3/uL — ABNORMAL LOW (ref 150–440)
RBC: 2.95 MIL/uL — ABNORMAL LOW (ref 4.40–5.90)
RDW: 16.6 % — ABNORMAL HIGH (ref 11.5–14.5)
WBC: 17.3 10*3/uL — ABNORMAL HIGH (ref 3.8–10.6)

## 2017-05-03 LAB — BLOOD CULTURE ID PANEL (REFLEXED)
ACINETOBACTER BAUMANNII: NOT DETECTED
CANDIDA ALBICANS: NOT DETECTED
CANDIDA KRUSEI: NOT DETECTED
Candida glabrata: NOT DETECTED
Candida parapsilosis: NOT DETECTED
Candida tropicalis: NOT DETECTED
Carbapenem resistance: NOT DETECTED
ENTEROBACTER CLOACAE COMPLEX: NOT DETECTED
ESCHERICHIA COLI: DETECTED — AB
Enterobacteriaceae species: DETECTED — AB
Enterococcus species: NOT DETECTED
Haemophilus influenzae: NOT DETECTED
KLEBSIELLA OXYTOCA: NOT DETECTED
Klebsiella pneumoniae: NOT DETECTED
LISTERIA MONOCYTOGENES: NOT DETECTED
NEISSERIA MENINGITIDIS: NOT DETECTED
PROTEUS SPECIES: NOT DETECTED
Pseudomonas aeruginosa: NOT DETECTED
SERRATIA MARCESCENS: NOT DETECTED
STAPHYLOCOCCUS SPECIES: NOT DETECTED
STREPTOCOCCUS PYOGENES: NOT DETECTED
Staphylococcus aureus (BCID): NOT DETECTED
Streptococcus agalactiae: NOT DETECTED
Streptococcus pneumoniae: NOT DETECTED
Streptococcus species: NOT DETECTED

## 2017-05-03 LAB — GLUCOSE, CAPILLARY: GLUCOSE-CAPILLARY: 106 mg/dL — AB (ref 65–99)

## 2017-05-03 LAB — URINALYSIS, COMPLETE (UACMP) WITH MICROSCOPIC
Glucose, UA: NEGATIVE mg/dL
Ketones, ur: NEGATIVE mg/dL
NITRITE: NEGATIVE
PH: 5 (ref 5.0–8.0)
Protein, ur: NEGATIVE mg/dL
Specific Gravity, Urine: 1.036 — ABNORMAL HIGH (ref 1.005–1.030)

## 2017-05-03 LAB — BASIC METABOLIC PANEL
ANION GAP: 10 (ref 5–15)
BUN: 21 mg/dL — ABNORMAL HIGH (ref 6–20)
CALCIUM: 7.9 mg/dL — AB (ref 8.9–10.3)
CO2: 22 mmol/L (ref 22–32)
Chloride: 98 mmol/L — ABNORMAL LOW (ref 101–111)
Creatinine, Ser: 2.17 mg/dL — ABNORMAL HIGH (ref 0.61–1.24)
GFR, EST AFRICAN AMERICAN: 31 mL/min — AB (ref >60)
GFR, EST NON AFRICAN AMERICAN: 27 mL/min — AB (ref >60)
Glucose, Bld: 110 mg/dL — ABNORMAL HIGH (ref 65–99)
Potassium: 3.7 mmol/L (ref 3.5–5.1)
Sodium: 130 mmol/L — ABNORMAL LOW (ref 135–145)

## 2017-05-03 LAB — URINE DRUG SCREEN, QUALITATIVE (ARMC ONLY)
AMPHETAMINES, UR SCREEN: NOT DETECTED
Barbiturates, Ur Screen: NOT DETECTED
Benzodiazepine, Ur Scrn: NOT DETECTED
Cannabinoid 50 Ng, Ur ~~LOC~~: NOT DETECTED
Cocaine Metabolite,Ur ~~LOC~~: NOT DETECTED
MDMA (Ecstasy)Ur Screen: NOT DETECTED
METHADONE SCREEN, URINE: NOT DETECTED
OPIATE, UR SCREEN: NOT DETECTED
PHENCYCLIDINE (PCP) UR S: NOT DETECTED
Tricyclic, Ur Screen: NOT DETECTED

## 2017-05-03 LAB — ALBUMIN, PLEURAL OR PERITONEAL FLUID: Albumin, Fluid: <1 g/dL

## 2017-05-03 LAB — MAGNESIUM: Magnesium: 1 mg/dL — ABNORMAL LOW (ref 1.7–2.4)

## 2017-05-03 LAB — PHOSPHORUS: PHOSPHORUS: 3.8 mg/dL (ref 2.5–4.6)

## 2017-05-03 LAB — PROTEIN, PLEURAL OR PERITONEAL FLUID: Total protein, fluid: <3 g/dL

## 2017-05-03 MED ORDER — MAGNESIUM SULFATE 4 GM/100ML IV SOLN
4.0000 g | Freq: Once | INTRAVENOUS | Status: DC
  Filled 2017-05-03: qty 100

## 2017-05-03 MED ORDER — SODIUM CHLORIDE 0.9 % IV SOLN
1.0000 g | Freq: Two times a day (BID) | INTRAVENOUS | Status: DC
  Administered 2017-05-03 – 2017-05-05 (×5): 1 g via INTRAVENOUS
  Filled 2017-05-03 (×6): qty 1

## 2017-05-03 MED ORDER — PREDNISOLONE SODIUM PHOSPHATE 15 MG/5ML PO SOLN
40.0000 mg | Freq: Every day | ORAL | Status: DC
  Administered 2017-05-04: 40 mg via ORAL
  Filled 2017-05-03: qty 15

## 2017-05-03 NOTE — Consult Note (Signed)
[name], MD [facility]  [address] [place], [place] [address] Phone: [phone] Fax : [phone]  Consultation  Referring Provider:     Dr. Leslye Peer Primary Care Physician:  Baxter Hire, MD Primary Gastroenterologist:  Dr. Gustavo Lah         Reason for Consultation:     Cirrhosis  Date of Admission:  05/02/2017 Date of Consultation:  05/03/2017         HPI:   Joseph Hawkins is a 83 y.o. male who has a long history of alcohol abuse.  The patient states that he drinks a pint of vodka a week but the patient's daughter shakes her head indicating that this is not true.  The patient also has been drinking for many years.  He was following up with Dr. Gustavo Lah in the past but it does not appear that he has been seen since 2016.  The patient also follows up with his primary care provider Dr. Edwina Barth.  Apparently 3 days ago the patient started to have some fevers and chills with diarrhea.  The patient was admitted with increased creatinine and it has trended upward since yesterday.  The patient does report increased abdominal distention and had an ultrasound showing cirrhotic changes with ascites and thickening of the gallbladder wall with gallbladder bladder stones but no evidence of acute cholecystitis.  The patient has been set up for a paracentesis for today.  The patient does report that he is not having any abdominal pain fevers or chills at the present time.  I am now being asked to see this patient for his cirrhosis, ascites and sepsis.  Past Medical History:  Diagnosis Date  . Anemia   . Chronic kidney disease   . Hypertension   . MDS (myelodysplastic syndrome) ([place]) 08/02/2015    Past Surgical History:  Procedure Laterality Date  . RADIOLOGY WITH ANESTHESIA N/A 05/07/2013   Procedure: RADIOLOGY WITH ANESTHESIA;  Surgeon: Jacqulynn Cadet, MD;  Location: [facility];  Service: Radiology;  Laterality: N/A;    Prior to Admission medications   Medication Sig Start Date End Date  Taking? Authorizing Provider  allopurinol (ZYLOPRIM) 300 MG tablet Take 300 mg by mouth daily.   Yes [provider]  cholecalciferol (VITAMIN D) 1000 UNITS tablet Take 1,000 Units by mouth daily.   Yes [provider]  ferrous sulfate (FERROUSUL) 325 (65 FE) MG tablet Take 325 mg by mouth daily with breakfast.   Yes [provider]  folic acid (FOLVITE) 1 MG tablet Take 1 tablet (1 mg total) by mouth daily. 05/20/13  Yes Delfina Redwood, MD  furosemide (LASIX) 40 MG tablet Take 1 tablet (40 mg total) by mouth daily. 05/20/13  Yes Delfina Redwood, MD  levothyroxine (SYNTHROID, LEVOTHROID) 75 MCG tablet Take 75 mcg by mouth daily before breakfast.   Yes [provider]  nadolol (CORGARD) 40 MG tablet Take 40 mg by mouth daily.   Yes [provider]  pantoprazole (PROTONIX) 40 MG tablet Take 1 tablet (40 mg total) by mouth daily. Patient taking differently: Take 40 mg by mouth 2 (two) times daily.  05/20/13  Yes Delfina Redwood, MD  spironolactone (ALDACTONE) 50 MG tablet Take 50 mg by mouth daily.   Yes [provider]  thiamine 100 MG tablet Take 1 tablet (100 mg total) by mouth daily. 05/20/13  Yes Delfina Redwood, MD  albuterol (PROVENTIL) (2.5 MG/3ML) 0.083% nebulizer solution Take 3 mLs (2.5 mg total) by  nebulization every 6 (six) hours as needed for wheezing or shortness of breath. Patient not taking: Reported on 05/02/2017 05/20/13   Delfina Redwood, MD  mometasone-formoterol [facility]) 100-5 MCG/ACT AERO Inhale 2 puffs into the lungs 2 (two) times daily. Patient not taking: Reported on 05/02/2017 05/20/13   Delfina Redwood, MD  tamsulosin (FLOMAX) 0.4 MG CAPS capsule Take 1 capsule (0.4 mg total) by mouth daily. Patient not taking: Reported on 05/02/2017 05/20/13   Delfina Redwood, MD    Family History  Family history unknown: Yes     Social History   Tobacco Use  . Smoking status: Never Smoker  . Smokeless tobacco:  Never Used  Substance Use Topics  . Alcohol use: Yes    Alcohol/week: 2.4 oz    Types: 4 Shots of liquor per week    Comment: 4 shots of Liquor a day  . Drug use: No    Allergies as of 05/02/2017  . (No Known Allergies)    Review of Systems:    All systems reviewed and negative except where noted in HPI.   Physical Exam:  Vital signs in last 24 hours: Temp:  [98.2 F (36.8 C)-100.8 F (38.2 C)] 98.2 F (36.8 C) (05/03 0837) Pulse Rate:  [70-143] 70 (05/03 0839) Resp:  [18-30] 20 (05/03 0837) BP: (80-125)/(55-84) 83/57 (05/03 0839) SpO2:  [95 %-100 %] 98 % (05/03 0839) Weight:  [206 lb (93.4 kg)-208 lb 12.4 oz (94.7 kg)] 208 lb 12.4 oz (94.7 kg) (05/03 0310) Last BM Date: 05/03/17 General:   Pleasant, cooperative in NAD Head:  Normocephalic and atraumatic. Eyes:   No icterus.   Conjunctiva pink. PERRLA. Ears:  Normal auditory acuity. Neck:  Supple; no masses or thyroidomegaly Lungs: Respirations even and unlabored. Lungs clear to auscultation bilaterally.   No wheezes, crackles, or rhonchi.  Heart:  Regular rate and rhythm;  Without murmur, clicks, rubs or gallops Abdomen:  Soft, positive distention consistent with ascites, nontender. Normal bowel sounds. No appreciable masses or hepatomegaly.  No rebound or guarding.  Rectal:  Not performed. Msk:  Symmetrical without gross deformities.    Extremities:  With edema bilaterally, without cyanosis or clubbing. Neurologic:  Alert and oriented x3;  grossly normal neurologically. Skin:  Intact without significant lesions or rashes. Cervical Nodes:  No significant cervical adenopathy. Psych:  Alert and cooperative. Normal affect.  LAB RESULTS: Recent Labs    05/02/17 1452 05/03/17 0545  WBC 12.1* 17.3*  HGB 12.7* 12.1*  HCT 37.1* 34.9*  PLT 92* 76*   BMET Recent Labs    05/02/17 1452 05/03/17 0545  NA 131* 130*  K 3.6 3.7  CL 99* 98*  CO2 21* 22  GLUCOSE 129* 110*  BUN 15 21*  CREATININE 1.61* 2.17*  CALCIUM  8.4* 7.9*   LFT Recent Labs    05/02/17 1452  PROT 7.2  ALBUMIN 2.0*  AST 67*  ALT 24  ALKPHOS 235*  BILITOT 7.1*   PT/INR Recent Labs    05/02/17 2041  LABPROT 21.4*  INR 1.87    STUDIES: Dg Chest 2 View  Result Date: 05/02/2017 CLINICAL DATA:  Fever, cough, diarrhea for 2 days EXAM: CHEST - 2 VIEW COMPARISON:  Portable chest x-ray of 05/16/2013 FINDINGS: No active infiltrate or effusion is seen. The lungs are not optimally aerated. Mediastinal and hilar contours are unremarkable. The heart is mildly enlarged. No acute bony abnormality is seen. IMPRESSION: No active lung disease.  Borderline to mild cardiomegaly. Electronically Signed  By: Ivar Drape M.D.   On: 05/02/2017 15:21   Ct Abdomen Pelvis W Contrast  Result Date: 05/02/2017 CLINICAL DATA:  83 year old male with fever, chills and diarrhea EXAM: CT ABDOMEN AND PELVIS WITH CONTRAST TECHNIQUE: Multidetector CT imaging of the abdomen and pelvis was performed using the standard protocol following bolus administration of intravenous contrast. CONTRAST:  78mL OMNIPAQUE IOHEXOL 300 MG/ML  SOLN COMPARISON:  Prior CT scan of the abdomen and pelvis 05/09/2013 FINDINGS: Lower chest: The lung bases are clear. Visualized cardiac structures are within normal limits for size. No pericardial effusion. Unremarkable visualized distal thoracic esophagus. Hepatobiliary: The liver is relatively small and demonstrates a diffusely nodular contour. There is relative hypertrophy of the left and caudate lobes and atrophy of the right hepatic lobe. No enhancing mass. The portal veins remain patent. Stones are present within the gallbladder. No gallbladder dilatation or wall thickening. No biliary ductal dilatation. Pancreas: Unremarkable. No pancreatic ductal dilatation or surrounding inflammatory changes. Spleen: Borderline splenomegaly. Adrenals/Urinary Tract: Unremarkable adrenal glands. No enhancing renal mass, hydronephrosis or nephrolithiasis. Ureters  are unremarkable. Bladder is only partially distended but unremarkable for degree of distention. Stomach/Bowel: No evidence of obstruction or focal bowel wall thickening. Vascular/Lymphatic: Atherosclerotic calcifications present throughout the abdominal aorta. No evidence of aneurysm. No evidence of a recurrent gastric varices. Metallic coils are present in the remnant of a left gastro renal shunt. The visceral veins are patent. Reproductive: Prostate is unremarkable. Other: Large volume ascites. Musculoskeletal: No acute fracture or aggressive appearing lytic or blastic osseous lesion. IMPRESSION: 1. Cirrhosis with portal hypertension and large volume ascites. 2. 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 No evidence of recurrent or residual gastric varices. 3. Cholelithiasis. 4.  Aortic Atherosclerosis (ICD10-170.0). Electronically Signed   By: Jacqulynn Cadet M.D.   On: 05/02/2017 16:31   US Abdomen Limited Ruq  Result Date: 05/02/2017 CLINICAL DATA:  Elevated alkaline phosphatase. Bilirubin 7.1. Abdominal distention. History of cirrhosis. EXAM: ULTRASOUND ABDOMEN LIMITED RIGHT UPPER QUADRANT COMPARISON:  CT of the abdomen and pelvis on 05/02/2017 FINDINGS: Gallbladder: Gallbladder is filled with stones and contracted around the stones. Largest stone measures approximately 11 millimeters. Gallbladder wall is thickened, 5.4 millimeters. No sonographic Murphy sign identified. Common bile duct: Diameter: 3.5 millimeters Liver: No focal lesion identified. Within normal limits in parenchymal echogenicity. Marked nodular contour the liver consistent with cirrhosis. No focal liver lesions are identified. Additional: Moderate volume ascites. IMPRESSION: 1. Cirrhotic changes in the liver. 2. Ascites. 3. Cholelithiasis and thickened gallbladder wall. No other evidence for acute cholecystitis. Electronically Signed   By: [name] M.D.   On: 05/02/2017  17:46      Impression / Plan:   Joseph Hawkins is a 83 y.o. y/o male with extensive alcohol abuse with alcoholic cirrhosis who now was found to have ascites and thought to have sepsis.  The patient is to have a ultrasound-guided paracentesis today.  The patient is also being seen by nephrology due to his increased creatinine.  If this is in fact hepatorenal syndrome the patient's prognosis would be very poor.  The patient has been encouraged to stop his alcohol abuse.  I would continue to treat him symptomatically with antibiotics if the paracentesis shows SBP otherwise I would hold off on any diuretics at this time due to his increased creatinine.  The patient's family and the patient have been explained the extensive damage to his liver to present like this.  The patient and the  family have been explained the plan and agree with it.  Thank you for involving me in the care of this patient.      LOS: 1 day   [name], MD  05/03/2017, 11:34 AM   Note: This dictation was prepared with Dragon dictation along with smaller phrase technology. Any transcriptional errors that result from this process are unintentional.

## 2017-05-03 NOTE — Progress Notes (Signed)
Patient ID: Joseph Hawkins, male   DOB: 11-Apr-1934, 83 y.o.   MRN: [id number]  [facility] PROGRESS NOTE  Joseph Hawkins [id number] DOB: December 25, 1934 DOA: 05/02/2017 PCP: Baxter Hire, MD  HPI/Subjective: Patient was admitted with sepsis.  History of cirrhosis and alcohol abuse.  Has abdominal distention and burning on urination.  Also had diarrhea.  Objective: Vitals:   05/03/17 1130 05/03/17 1214  BP: (!) 90/58 (!) 82/54  Pulse: 74 69  Resp: 20 18  Temp:  (!) 97.5 F (36.4 C)  SpO2: 97% 98%    Filed Weights   05/02/17 1345 05/02/17 2020 05/03/17 0310  Weight: 93.4 kg (206 lb) 94.3 kg (208 lb) 94.7 kg (208 lb 12.4 oz)    ROS: Review of Systems  Constitutional: Negative for chills and fever.  Eyes: Negative for blurred vision.  Respiratory: Positive for shortness of breath. Negative for cough.   Cardiovascular: Negative for chest pain.  Gastrointestinal: Positive for abdominal pain, diarrhea and nausea. Negative for constipation and vomiting.  Genitourinary: Negative for dysuria.  Musculoskeletal: Negative for joint pain.  Neurological: Negative for dizziness and headaches.   Exam: Physical Exam  Constitutional: He is oriented to person, place, and time.  HENT:  Nose: No mucosal edema.  Mouth/Throat: No oropharyngeal exudate or posterior oropharyngeal edema.  Eyes: Pupils are equal, round, and reactive to light. Conjunctivae, EOM and lids are normal.  Neck: No JVD present. Carotid bruit is not present. No edema present. No thyroid mass and no thyromegaly present.  Cardiovascular: S1 normal and S2 normal. Exam reveals no gallop.  No murmur heard. Pulses:      Dorsalis pedis pulses are 2+ on the right side, and 2+ on the left side.  Respiratory: No respiratory distress. He has decreased breath sounds in the right lower field and the left lower field. He has no wheezes. He has no rhonchi. He has no rales.  GI: Soft. Bowel sounds are normal. He exhibits distension. There  is no tenderness.  Musculoskeletal:       Right ankle: He exhibits swelling.       Left ankle: He exhibits swelling.  Lymphadenopathy:    He has no cervical adenopathy.  Neurological: He is alert and oriented to person, place, and time. No cranial nerve deficit.  Skin: Skin is warm. No rash noted. Nails show no clubbing.  Psychiatric: He has a normal mood and affect.      Data Reviewed: Basic Metabolic Panel: Recent Labs  Lab 05/02/17 1452 05/03/17 0545  NA 131* 130*  K 3.6 3.7  CL 99* 98*  CO2 21* 22  GLUCOSE 129* 110*  BUN 15 21*  CREATININE 1.61* 2.17*  CALCIUM 8.4* 7.9*  MG  --  1.0*  PHOS  --  3.8   Liver Function Tests: Recent Labs  Lab 05/02/17 1452  AST 67*  ALT 24  ALKPHOS 235*  BILITOT 7.1*  PROT 7.2  ALBUMIN 2.0*   Recent Labs  Lab 05/02/17 1452  LIPASE 38   CBC: Recent Labs  Lab 05/02/17 1452 05/03/17 0545  WBC 12.1* 17.3*  NEUTROABS 10.1*  --   HGB 12.7* 12.1*  HCT 37.1* 34.9*  MCV 117.9* 118.2*  PLT 92* 76*   Cardiac Enzymes: Recent Labs  Lab 05/02/17 1452  TROPONINI <0.03    CBG: Recent Labs  Lab 05/02/17 2155 05/02/17 2227 05/03/17 0810  GLUCAP 66 88 106*    Recent Results (from the past 240 hour(s))  Culture, blood (routine  x 2)     Status: None (Preliminary result)   Collection Time: 05/02/17  2:53 PM  Result Value Ref Range Status   Specimen Description   Final    BLOOD BLOOD LEFT HAND Performed at [facility], [address] [place], [place] [address]    Special Requests   Final    BOTTLES DRAWN AEROBIC AND ANAEROBIC Blood Culture results may not be optimal due to an inadequate volume of blood received in culture bottles Performed at [facility], [address] [place], [place] [address]    Culture  Setup Time   Final    GRAM NEGATIVE RODS IN BOTH AEROBIC AND ANAEROBIC BOTTLES CRITICAL RESULT CALLED TO, READ BACK BY AND VERIFIED WITH: MATT MCBANE @ 0206 ON 05/03/2017 BY CAF Performed  at [facility], [place] [address] [place], [place] [address]    Culture GRAM NEGATIVE RODS  Final   Report Status PENDING  Incomplete  Culture, blood (routine x 2)     Status: None (Preliminary result)   Collection Time: 05/02/17  2:53 PM  Result Value Ref Range Status   Specimen Description BLOOD RIGHT ANTECUBITAL  Final   Special Requests   Final    Blood Culture results may not be optimal due to an inadequate volume of blood received in culture bottles   Culture  Setup Time   Final    GRAM NEGATIVE RODS IN BOTH AEROBIC AND ANAEROBIC BOTTLES CRITICAL RESULT CALLED TO, READ BACK BY AND VERIFIED WITH: MATT MCBANE @ 0206 ON 05/03/2017 BY CAF Performed at [facility], [address] [place], [place] [address]    Culture GRAM NEGATIVE RODS  Final   Report Status PENDING  Incomplete  C difficile quick scan w PCR reflex     Status: None   Collection Time: 05/02/17  2:53 PM  Result Value Ref Range Status   C Diff antigen NEGATIVE NEGATIVE Final   C Diff toxin NEGATIVE NEGATIVE Final   C Diff interpretation No C. difficile detected.  Final    Comment: Performed at [facility], [address] [place], [place] [address]  Blood Culture ID Panel (Reflexed)     Status: Abnormal   Collection Time: 05/02/17  2:53 PM  Result Value Ref Range Status   Enterococcus species NOT DETECTED NOT DETECTED Final   Listeria monocytogenes NOT DETECTED NOT DETECTED Final   Staphylococcus species NOT DETECTED NOT DETECTED Final   Staphylococcus aureus NOT DETECTED NOT DETECTED Final   Streptococcus species NOT DETECTED NOT DETECTED Final   Streptococcus agalactiae NOT DETECTED NOT DETECTED Final   Streptococcus pneumoniae NOT DETECTED NOT DETECTED Final   Streptococcus pyogenes NOT DETECTED NOT DETECTED Final   Acinetobacter baumannii NOT DETECTED NOT DETECTED Final   Enterobacteriaceae species DETECTED (A) NOT DETECTED Final    Comment: Enterobacteriaceae represent a large family  of gram-negative bacteria, not a single organism. CRITICAL RESULT CALLED TO, READ BACK BY AND VERIFIED WITH: MATT MCBANE @ 0206 ON 05/03/2017 BY CAF    Enterobacter cloacae complex NOT DETECTED NOT DETECTED Final   Escherichia coli DETECTED (A) NOT DETECTED Final    Comment: CRITICAL RESULT CALLED TO, READ BACK BY AND VERIFIED WITH: MATT MCBANE @ 0206 ON 05/03/2017 BY CAF    Klebsiella oxytoca NOT DETECTED NOT DETECTED Final   Klebsiella pneumoniae NOT DETECTED NOT DETECTED Final   Proteus species NOT DETECTED NOT DETECTED Final   Serratia marcescens NOT DETECTED NOT DETECTED Final  Carbapenem resistance NOT DETECTED NOT DETECTED Final   Haemophilus influenzae NOT DETECTED NOT DETECTED Final   Neisseria meningitidis NOT DETECTED NOT DETECTED Final   Pseudomonas aeruginosa NOT DETECTED NOT DETECTED Final   Candida albicans NOT DETECTED NOT DETECTED Final   Candida glabrata NOT DETECTED NOT DETECTED Final   Candida krusei NOT DETECTED NOT DETECTED Final   Candida parapsilosis NOT DETECTED NOT DETECTED Final   Candida tropicalis NOT DETECTED NOT DETECTED Final    Comment: Performed at [facility], [address] [place], [place] [address]  Body fluid culture     Status: None (Preliminary result)   Collection Time: 05/03/17 11:30 AM  Result Value Ref Range Status   Specimen Description   Final    PERITONEAL Performed at [facility], [address] [place], [place] [address]    Special Requests   Final    NONE Performed at [facility], [address] [place], [place] [address]    Gram Stain   Final    RARE WBC PRESENT,BOTH PMN AND MONONUCLEAR NO ORGANISMS SEEN Performed at [facility], [place] [address] [place], [place] [address]    Culture PENDING  Incomplete   Report Status PENDING  Incomplete     Studies: Dg Chest 2 View  Result Date: 05/02/2017 CLINICAL DATA:  Fever, cough, diarrhea for 2 days EXAM: CHEST - 2 VIEW COMPARISON:  Portable  chest x-ray of 05/16/2013 FINDINGS: No active infiltrate or effusion is seen. The lungs are not optimally aerated. Mediastinal and hilar contours are unremarkable. The heart is mildly enlarged. No acute bony abnormality is seen. IMPRESSION: No active lung disease.  Borderline to mild cardiomegaly. Electronically Signed   By: Ivar Drape M.D.   On: 05/02/2017 15:21   Ct Abdomen Pelvis W Contrast  Result Date: 05/02/2017 CLINICAL DATA:  83 year old male with fever, chills and diarrhea EXAM: CT ABDOMEN AND PELVIS WITH CONTRAST TECHNIQUE: Multidetector CT imaging of the abdomen and pelvis was performed using the standard protocol following bolus administration of intravenous contrast. CONTRAST:  38mL OMNIPAQUE IOHEXOL 300 MG/ML  SOLN COMPARISON:  Prior CT scan of the abdomen and pelvis 05/09/2013 FINDINGS: Lower chest: The lung bases are clear. Visualized cardiac structures are within normal limits for size. No pericardial effusion. Unremarkable visualized distal thoracic esophagus. Hepatobiliary: The liver is relatively small and demonstrates a diffusely nodular contour. There is relative hypertrophy of the left and caudate lobes and atrophy of the right hepatic lobe. No enhancing mass. The portal veins remain patent. Stones are present within the gallbladder. No gallbladder dilatation or wall thickening. No biliary ductal dilatation. Pancreas: Unremarkable. No pancreatic ductal dilatation or surrounding inflammatory changes. Spleen: Borderline splenomegaly. Adrenals/Urinary Tract: Unremarkable adrenal glands. No enhancing renal mass, hydronephrosis or nephrolithiasis. Ureters are unremarkable. Bladder is only partially distended but unremarkable for degree of distention. Stomach/Bowel: No evidence of obstruction or focal bowel wall thickening. Vascular/Lymphatic: Atherosclerotic calcifications present throughout the abdominal aorta. No evidence of aneurysm. No evidence of a recurrent gastric varices. Metallic coils  are present in the remnant of a left gastro renal shunt. The visceral veins are patent. Reproductive: Prostate is unremarkable. Other: Large volume ascites. Musculoskeletal: No acute fracture or aggressive appearing lytic or blastic osseous lesion. IMPRESSION: 1. Cirrhosis with portal hypertension and large volume ascites. 2. 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 No evidence of recurrent or residual gastric varices. 3. Cholelithiasis. 4.  Aortic Atherosclerosis (ICD10-170.0). Electronically Signed  By: Jacqulynn Cadet M.D.   On: 05/02/2017 16:31   US Paracentesis  Result Date: 05/03/2017 INDICATION: 83 year old with cirrhosis and ascites. Request for a diagnostic paracentesis. Patient is hypotensive. EXAM: ULTRASOUND GUIDED DIAGNOSTIC PARACENTESIS MEDICATIONS: None. COMPLICATIONS: None immediate. PROCEDURE: Informed written consent was obtained from the patient after a discussion of the risks, benefits and alternatives to treatment. A timeout was performed prior to the initiation of the procedure. Initial ultrasound scanning demonstrates a large amount of ascites within the right lower abdominal quadrant. The right lower abdomen was prepped and draped in the usual sterile fashion. 1% lidocaine was used for local anesthesia. Following this, a 6 Fr Safe-T-Centesis catheter was introduced. An ultrasound image was saved for documentation purposes. The paracentesis was performed. Paracentesis was stopped after 2 L due to patient's hypotension. Blood pressure did not significantly change with the paracentesis. The catheter was removed and a dressing was applied. The patient tolerated the procedure well without immediate post procedural complication. FINDINGS: A total of approximately 2 L of yellow fluid was removed. Samples were sent to the laboratory as requested by the clinical team. IMPRESSION: Successful ultrasound-guided diagnostic paracentesis  yielding 2 liters of peritoneal fluid. Electronically Signed   By: Markus Daft M.D.   On: 05/03/2017 12:12   US Abdomen Limited Ruq  Result Date: 05/02/2017 CLINICAL DATA:  Elevated alkaline phosphatase. Bilirubin 7.1. Abdominal distention. History of cirrhosis. EXAM: ULTRASOUND ABDOMEN LIMITED RIGHT UPPER QUADRANT COMPARISON:  CT of the abdomen and pelvis on 05/02/2017 FINDINGS: Gallbladder: Gallbladder is filled with stones and contracted around the stones. Largest stone measures approximately 11 millimeters. Gallbladder wall is thickened, 5.4 millimeters. No sonographic Murphy sign identified. Common bile duct: Diameter: 3.5 millimeters Liver: No focal lesion identified. Within normal limits in parenchymal echogenicity. Marked nodular contour the liver consistent with cirrhosis. No focal liver lesions are identified. Additional: Moderate volume ascites. IMPRESSION: 1. Cirrhotic changes in the liver. 2. Ascites. 3. Cholelithiasis and thickened gallbladder wall. No other evidence for acute cholecystitis. Electronically Signed   By: [name] M.D.   On: 05/02/2017 17:46    Scheduled Meds: . allopurinol  300 mg Oral Daily  . cholecalciferol  1,000 Units Oral Daily  . docusate sodium  100 mg Oral BID  . ferrous sulfate  325 mg Oral Q breakfast  . folic acid  1 mg Oral Daily  . levothyroxine  75 mcg Oral QAC breakfast  . LORazepam  0-4 mg Intravenous Q6H   Followed by  . [START ON 05/04/2017] LORazepam  0-4 mg Intravenous Q12H  . mometasone-formoterol  2 puff Inhalation BID  . multivitamin with minerals  1 tablet Oral Daily  . nadolol  40 mg Oral Daily  . tamsulosin  0.4 mg Oral Daily  . thiamine  100 mg Oral Daily   Or  . thiamine  100 mg Intravenous Daily   Continuous Infusions: . magnesium sulfate 1 - 4 g bolus IVPB 50 mL/hr at 05/03/17 1524  . meropenem (MERREM) IV Stopped (05/03/17 0558)    Assessment/Plan:  1. E. coli sepsis with multiorgan failure.  Which could be secondary to  SBP.  Meropenem started.  Follow-up blood cultures.  Paracentesis done today.  So far cultures for that are negative. 2. Hypotension and lactic acidosis.  Patient started wheezing with IV fluids.  Get rid of nadolol, Flomax Spironolactone , and Lasix. 3. Acute kidney injury secondary to hypotension and sepsis.  Holding antihypertensive medications.  Had wheezing with gentle fluids. 4. Alcoholic hepatitis with  liver cirrhosis, jaundice, coagulopathy, ascites and thrombocytopenia and increased liver function test.   start prednisolone 40 mg daily. 5. Hypomagnesemia.  Give 4 g IV magnesium 6. Worsening thrombocytopenia secondary to sepsis and  liver cirrhosis.  Discontinue heparin subcu.  TED hose 7. Hyponatremia secondary to alcohol abuse  Code Status:     Code Status Orders  (From admission, onward)        Start     Ordered   05/02/17 1836  Full code  Continuous     05/02/17 1838    Code Status History    Date Active Date Inactive Code Status Order ID Comments User Context   05/07/2013 1849 05/20/2013 1715 Full Code [id number]  Jacqulynn Cadet, MD Inpatient   05/06/2013 0109 05/07/2013 1849 Full Code [id number]  Germain Osgood, PA-C Inpatient    Advance Directive Documentation     Most Recent Value  Type of Advance Directive  Healthcare Power of Attorney  Pre-existing out of facility DNR order (yellow form or pink MOST form)  -  "MOST" Form in Place?  -     Family Communication: Wife at the bedside Disposition Plan: To be determined  Consultants:  Gastroenterology  Nephrology  Antibiotics:  Meropenem  Time spent: 28 minutes  [place]

## 2017-05-03 NOTE — Procedures (Signed)
US paracentesis performed.  Removed 2 liters of yellow fluid.  Stopped after 2 liters due to baseline hypotension.  No blood loss and no immediate complication.

## 2017-05-03 NOTE — Progress Notes (Addendum)
Initial Nutrition Assessment  DOCUMENTATION CODES:   Not applicable  INTERVENTION:   Ensure Enlive po TID, each supplement provides 350 kcal and 20 grams of protein  MVI daily  Magic cup TID with meals, each supplement provides 290 kcal and 9 grams of protein  Thiamine 510CH po daily  Folic acid 79m po daily   NUTRITION DIAGNOSIS:   Increased nutrient needs related to chronic illness(ETOH abuse, cirrhosis with ascites ) as evidenced by increased estimated needs.  GOAL:   Patient will meet greater than or equal to 90% of their needs  MONITOR:   PO intake, Supplement acceptance, Labs, Weight trends, Skin, I & O's  REASON FOR ASSESSMENT:   Malnutrition Screening Tool    ASSESSMENT:   89ear-old male patient with heavy alcohol abuse, liver cirrhosis, ascites previous history of paracentesis years ago comes in with fever, dry cough, diarrhea.   Met with pt and family in room today. History provided by patient's family members at bedside as pt being taken for paracentesis at time of RD visit. Pt with good appetite and oral intake at baseline per family. Pt ate 40% of his breakfast this morning. Per chart, pt is weight stable. Pt does drink Ensure at home. Per family, pt with past history of difficulty swallowing but this seems to be resolved with daily PPIs. Pt on CIWA protocol. Pt noted to have macrocytic anemia secondary to etoh abuse vs folate/B12 deficiency; would recommend check labs to rule out any deficiencies. RD will add supplements to help pt meet his estimated needs. Pt noted to have diarrhea; will leave patient on heart healthy diet in case of steatorrhea; may consider liberalizing diet if oral intake does not improve. Pt noted to have low magnesium today; replete per MD.    Medications reviewed and include: allopurinol, vitamin D, colace, ferrous sulfate, folic acid, synthroid, MVI, thiamine, meropenem   Labs reviewed: Na 130(L), Cl 98(L), BUN 21(H), creat 2.17(H),  Ca 7.9(L), P 3.8 wnl, Mg 1.0(L) tbili- 7.1(H)- 5/2 Wbc- 17.3(H), Hgb 12.1(L), Hct 34.9(L), MCV 118.2(H), MCH 40.8(H) Macrocytic anemia noted   NUTRITION - FOCUSED PHYSICAL EXAM:    Most Recent Value  Orbital Region  No depletion  Upper Arm Region  No depletion  Thoracic and Lumbar Region  No depletion  Buccal Region  No depletion  Temple Region  Moderate depletion  Clavicle Bone Region  Moderate depletion  Clavicle and Acromion Bone Region  Moderate depletion  Scapular Bone Region  Moderate depletion  Dorsal Hand  Mild depletion  Patellar Region  Unable to assess  Anterior Thigh Region  Unable to assess  Posterior Calf Region  Unable to assess  Edema (RD Assessment)  Moderate [BLE]  Hair  Reviewed  Eyes  Reviewed  Mouth  Reviewed  Skin  Reviewed  Nails  Reviewed     Diet Order:   Diet Order           Diet Heart Room service appropriate? Yes; Fluid consistency: Thin  Diet effective now         EDUCATION NEEDS:   No education needs have been identified at this time  Skin:  Skin Assessment: Reviewed RN Assessment  Last BM:  5/3- type 7  Height:   Ht Readings from Last 1 Encounters:  05/02/17 6' (1.829 m)    Weight:   Wt Readings from Last 1 Encounters:  05/03/17 208 lb 12.4 oz (94.7 kg)    Ideal Body Weight:  80.9 kg  BMI:  Body mass  index is 28.32 kg/m.  Estimated Nutritional Needs:   Kcal:  2200-2500kcal/day   Protein:  113-123g/day   Fluid:  per MD  Koleen Distance MS, RD, LDN Pager #[phone] After Hours Pager: [phone]

## 2017-05-04 DIAGNOSIS — K7011 Alcoholic hepatitis with ascites: Secondary | ICD-10-CM

## 2017-05-04 LAB — COMPREHENSIVE METABOLIC PANEL
ALBUMIN: 1.6 g/dL — AB (ref 3.5–5.0)
ALT: 21 U/L (ref 17–63)
ANION GAP: 9 (ref 5–15)
AST: 51 U/L — ABNORMAL HIGH (ref 15–41)
Alkaline Phosphatase: 162 U/L — ABNORMAL HIGH (ref 38–126)
BILIRUBIN TOTAL: 5.7 mg/dL — AB (ref 0.3–1.2)
BUN: 33 mg/dL — ABNORMAL HIGH (ref 6–20)
CHLORIDE: 97 mmol/L — AB (ref 101–111)
CO2: 22 mmol/L (ref 22–32)
CREATININE: 2.54 mg/dL — AB (ref 0.61–1.24)
Calcium: 7.6 mg/dL — ABNORMAL LOW (ref 8.9–10.3)
GFR calc non Af Amer: 22 mL/min — ABNORMAL LOW (ref >60)
GFR, EST AFRICAN AMERICAN: 26 mL/min — AB (ref >60)
Glucose, Bld: 97 mg/dL (ref 65–99)
POTASSIUM: 3.3 mmol/L — AB (ref 3.5–5.1)
SODIUM: 128 mmol/L — AB (ref 135–145)
Total Protein: 6 g/dL — ABNORMAL LOW (ref 6.5–8.1)

## 2017-05-04 LAB — CBC
HCT: 33.1 % — ABNORMAL LOW (ref 40.0–52.0)
HEMOGLOBIN: 11.5 g/dL — AB (ref 13.0–18.0)
MCH: 40.7 pg — ABNORMAL HIGH (ref 26.0–34.0)
MCHC: 34.8 g/dL (ref 32.0–36.0)
MCV: 116.8 fL — AB (ref 80.0–100.0)
PLATELETS: 70 10*3/uL — AB (ref 150–440)
RBC: 2.84 MIL/uL — ABNORMAL LOW (ref 4.40–5.90)
RDW: 16.5 % — ABNORMAL HIGH (ref 11.5–14.5)
WBC: 11.2 10*3/uL — AB (ref 3.8–10.6)

## 2017-05-04 LAB — RAPID HIV SCREEN (HIV 1/2 AB+AG)
HIV 1/2 Antibodies: NONREACTIVE
HIV-1 P24 ANTIGEN - HIV24: NONREACTIVE

## 2017-05-04 LAB — URINE CULTURE

## 2017-05-04 LAB — PROTEIN, BODY FLUID (OTHER): TOTAL PROTEIN, BODY FLUID OTHER: 1.4 g/dL

## 2017-05-04 LAB — GLUCOSE, CAPILLARY: Glucose-Capillary: 78 mg/dL (ref 65–99)

## 2017-05-04 MED ORDER — ALBUMIN HUMAN 25 % IV SOLN
25.0000 g | Freq: Two times a day (BID) | INTRAVENOUS | Status: AC
  Administered 2017-05-04 – 2017-05-06 (×6): 25 g via INTRAVENOUS
  Filled 2017-05-04 (×8): qty 100

## 2017-05-04 MED ORDER — TAMSULOSIN HCL 0.4 MG PO CAPS
0.4000 mg | ORAL_CAPSULE | Freq: Every day | ORAL | Status: DC
  Administered 2017-05-04 – 2017-05-06 (×3): 0.4 mg via ORAL
  Filled 2017-05-04 (×3): qty 1

## 2017-05-04 MED ORDER — PENTOXIFYLLINE ER 400 MG PO TBCR
400.0000 mg | EXTENDED_RELEASE_TABLET | Freq: Every day | ORAL | Status: DC
  Administered 2017-05-04 – 2017-05-07 (×4): 400 mg via ORAL
  Filled 2017-05-04 (×4): qty 1

## 2017-05-04 MED ORDER — MIDODRINE HCL 5 MG PO TABS
5.0000 mg | ORAL_TABLET | Freq: Three times a day (TID) | ORAL | Status: DC
  Administered 2017-05-04 – 2017-05-06 (×7): 5 mg via ORAL
  Filled 2017-05-04 (×8): qty 1

## 2017-05-04 NOTE — Progress Notes (Signed)
Patient ID: Joseph Hawkins, male   DOB: [birth year], 83 y.o.   MRN: [id number]  [facility] PROGRESS NOTE  Joseph Hawkins JHE:[id number] DOB: [birth year] DOA: 05/02/2017 PCP: Baxter Hire, MD  HPI/Subjective: Patient's abdominal pain is much improved Had a bladder scan which shows 700 cc of urine retention renal function slightly worse  Objective: Vitals:   05/04/17 0522 05/04/17 1311  BP: (!) 93/55 96/65  Pulse: 91 (!) 116  Resp: 18   Temp: 98.5 F (36.9 C) (!) 96.4 F (35.8 C)  SpO2: 98% 99%    Filed Weights   05/02/17 2020 05/03/17 0310 05/04/17 0445  Weight: 94.3 kg (208 lb) 94.7 kg (208 lb 12.4 oz) 94.3 kg (207 lb 14.4 oz)    ROS: Review of Systems  Constitutional: Negative for chills and fever.  Eyes: Negative for blurred vision.  Respiratory: Negative for cough and shortness of breath.   Cardiovascular: Negative for chest pain.  Gastrointestinal: Negative for abdominal pain, constipation, diarrhea, nausea and vomiting.  Genitourinary: Negative for dysuria.  Musculoskeletal: Negative for joint pain.  Neurological: Negative for dizziness and headaches.   Exam: Physical Exam  Constitutional: He is oriented to person, place, and time.  HENT:  Nose: No mucosal edema.  Mouth/Throat: No oropharyngeal exudate or posterior oropharyngeal edema.  Eyes: Pupils are equal, round, and reactive to light. Conjunctivae, EOM and lids are normal.  Neck: No JVD present. Carotid bruit is not present. No edema present. No thyroid mass and no thyromegaly present.  Cardiovascular: S1 normal and S2 normal. Exam reveals no gallop.  No murmur heard. Pulses:      Dorsalis pedis pulses are 2+ on the right side, and 2+ on the left side.  Respiratory: No respiratory distress. He has decreased breath sounds in the right lower field and the left lower field. He has no wheezes. He has no rhonchi. He has no rales.  GI: Soft. Bowel sounds are normal. He exhibits distension. There is no tenderness.   Musculoskeletal:       Right ankle: He exhibits swelling.       Left ankle: He exhibits swelling.  Lymphadenopathy:    He has no cervical adenopathy.  Neurological: He is alert and oriented to person, place, and time. No cranial nerve deficit.  Skin: Skin is warm. No rash noted. Nails show no clubbing.  Psychiatric: He has a normal mood and affect.      Data Reviewed: Basic Metabolic Panel: Recent Labs  Lab 05/02/17 1452 05/03/17 0545 05/04/17 0550  NA 131* 130* 128*  K 3.6 3.7 3.3*  CL 99* 98* 97*  CO2 21* 22 22  GLUCOSE 129* 110* 97  BUN 15 21* 33*  CREATININE 1.61* 2.17* 2.54*  CALCIUM 8.4* 7.9* 7.6*  MG  --  1.0*  --   PHOS  --  3.8  --    Liver Function Tests: Recent Labs  Lab 05/02/17 1452 05/04/17 0550  AST 67* 51*  ALT 24 21  ALKPHOS 235* 162*  BILITOT 7.1* 5.7*  PROT 7.2 6.0*  ALBUMIN 2.0* 1.6*   Recent Labs  Lab 05/02/17 1452  LIPASE 38   CBC: Recent Labs  Lab 05/02/17 1452 05/03/17 0545 05/04/17 0550  WBC 12.1* 17.3* 11.2*  NEUTROABS 10.1*  --   --   HGB 12.7* 12.1* 11.5*  HCT 37.1* 34.9* 33.1*  MCV 117.9* 118.2* 116.8*  PLT 92* 76* 70*   Cardiac Enzymes: Recent Labs  Lab 05/02/17 1452  TROPONINI <0.03  CBG: Recent Labs  Lab 05/02/17 2155 05/02/17 2227 05/03/17 0810 05/04/17 0750  GLUCAP 66 88 106* 78    Recent Results (from the past 240 hour(s))  Culture, blood (routine x 2)     Status: Abnormal (Preliminary result)   Collection Time: 05/02/17  2:53 PM  Result Value Ref Range Status   Specimen Description   Final    BLOOD BLOOD LEFT HAND Performed at [facility], [address], [place], [place] [address]    Special Requests   Final    BOTTLES DRAWN AEROBIC AND ANAEROBIC Blood Culture results may not be optimal due to an inadequate volume of blood received in culture bottles Performed at [facility], [address] [place], [place] [address]    Culture  Setup Time   Final    GRAM NEGATIVE  RODS IN BOTH AEROBIC AND ANAEROBIC BOTTLES CRITICAL RESULT CALLED TO, READ BACK BY AND VERIFIED WITH: MATT MCBANE @ 0206 ON 05/03/2017 BY CAF    Culture (A)  Final    ESCHERICHIA COLI SUSCEPTIBILITIES TO FOLLOW Performed at [facility], [place] [address], [place], [place] [address]    Report Status PENDING  Incomplete  Culture, blood (routine x 2)     Status: Abnormal (Preliminary result)   Collection Time: 05/02/17  2:53 PM  Result Value Ref Range Status   Specimen Description   Final    BLOOD RIGHT ANTECUBITAL Performed at [facility], [address], [place], [place] [address]    Special Requests   Final    Blood Culture results may not be optimal due to an inadequate volume of blood received in culture bottles Performed at [facility], [address] [place], [place] [address]    Culture  Setup Time   Final    GRAM NEGATIVE RODS IN BOTH AEROBIC AND ANAEROBIC BOTTLES CRITICAL RESULT CALLED TO, READ BACK BY AND VERIFIED WITH: MATT MCBANE @ 0206 ON 05/03/2017 BY CAF Performed at [facility], [address] [place], [place] [address]    Culture ESCHERICHIA COLI (A)  Final   Report Status PENDING  Incomplete  C difficile quick scan w PCR reflex     Status: None   Collection Time: 05/02/17  2:53 PM  Result Value Ref Range Status   C Diff antigen NEGATIVE NEGATIVE Final   C Diff toxin NEGATIVE NEGATIVE Final   C Diff interpretation No C. difficile detected.  Final    Comment: Performed at [facility], [address] [place], [place] [address]  Blood Culture ID Panel (Reflexed)     Status: Abnormal   Collection Time: 05/02/17  2:53 PM  Result Value Ref Range Status   Enterococcus species NOT DETECTED NOT DETECTED Final   Listeria monocytogenes NOT DETECTED NOT DETECTED Final   Staphylococcus species NOT DETECTED NOT DETECTED Final   Staphylococcus aureus NOT DETECTED NOT DETECTED Final   Streptococcus species NOT DETECTED NOT  DETECTED Final   Streptococcus agalactiae NOT DETECTED NOT DETECTED Final   Streptococcus pneumoniae NOT DETECTED NOT DETECTED Final   Streptococcus pyogenes NOT DETECTED NOT DETECTED Final   Acinetobacter baumannii NOT DETECTED NOT DETECTED Final   Enterobacteriaceae species DETECTED (A) NOT DETECTED Final    Comment: Enterobacteriaceae represent a large family of gram-negative bacteria, not a single organism. CRITICAL RESULT CALLED TO, READ BACK BY AND VERIFIED WITH: MATT MCBANE @ 0206 ON 05/03/2017 BY CAF    Enterobacter cloacae complex NOT DETECTED NOT DETECTED  Final   Escherichia coli DETECTED (A) NOT DETECTED Final    Comment: CRITICAL RESULT CALLED TO, READ BACK BY AND VERIFIED WITH: MATT MCBANE @ 0206 ON 05/03/2017 BY CAF    Klebsiella oxytoca NOT DETECTED NOT DETECTED Final   Klebsiella pneumoniae NOT DETECTED NOT DETECTED Final   Proteus species NOT DETECTED NOT DETECTED Final   Serratia marcescens NOT DETECTED NOT DETECTED Final   Carbapenem resistance NOT DETECTED NOT DETECTED Final   Haemophilus influenzae NOT DETECTED NOT DETECTED Final   Neisseria meningitidis NOT DETECTED NOT DETECTED Final   Pseudomonas aeruginosa NOT DETECTED NOT DETECTED Final   Candida albicans NOT DETECTED NOT DETECTED Final   Candida glabrata NOT DETECTED NOT DETECTED Final   Candida krusei NOT DETECTED NOT DETECTED Final   Candida parapsilosis NOT DETECTED NOT DETECTED Final   Candida tropicalis NOT DETECTED NOT DETECTED Final    Comment: Performed at [facility], [address] [place], [place] [address]  Urine Culture     Status: Abnormal   Collection Time: 05/03/17  9:16 AM  Result Value Ref Range Status   Specimen Description   Final    URINE, RANDOM Performed at [facility], [address], [place], [place] [address]    Special Requests   Final    NONE Performed at [facility], [address], [place], [place] [address]    Culture (A)  Final    <10,000  COLONIES/mL INSIGNIFICANT GROWTH Performed at [facility] [address], [place], [place] [address]    Report Status 05/04/2017 FINAL  Final  Body fluid culture     Status: None (Preliminary result)   Collection Time: 05/03/17 11:30 AM  Result Value Ref Range Status   Specimen Description   Final    PERITONEAL Performed at [facility], [address], [place], [place] [address]    Special Requests   Final    NONE Performed at [facility], [place], [place] [address]    Gram Stain   Final    RARE WBC PRESENT,BOTH PMN AND MONONUCLEAR NO ORGANISMS SEEN    Culture   Final    NO GROWTH 1 DAY Performed at [facility], [place] [address] [place], [place] [address]    Report Status PENDING  Incomplete     Studies: Dg Chest 2 View  Result Date: 05/02/2017 CLINICAL DATA:  Fever, cough, diarrhea for 2 days EXAM: CHEST - 2 VIEW COMPARISON:  Portable chest x-ray of 05/16/2013 FINDINGS: No active infiltrate or effusion is seen. The lungs are not optimally aerated. Mediastinal and hilar contours are unremarkable. The heart is mildly enlarged. No acute bony abnormality is seen. IMPRESSION: No active lung disease.  Borderline to mild cardiomegaly. Electronically Signed   By: Ivar Drape M.D.   On: 05/02/2017 15:21   Ct Abdomen Pelvis W Contrast  Result Date: 05/02/2017 CLINICAL DATA:  83 year old male with fever, chills and diarrhea EXAM: CT ABDOMEN AND PELVIS WITH CONTRAST TECHNIQUE: Multidetector CT imaging of the abdomen and pelvis was performed using the standard protocol following bolus administration of intravenous contrast. CONTRAST:  31mL OMNIPAQUE IOHEXOL 300 MG/ML  SOLN COMPARISON:  Prior CT scan of the abdomen and pelvis 05/09/2013 FINDINGS: Lower chest: The lung bases are clear. Visualized cardiac structures are within normal limits for size. No pericardial effusion. Unremarkable visualized distal thoracic esophagus. Hepatobiliary: The liver  is relatively small and demonstrates a diffusely nodular contour. There is relative  hypertrophy of the left and caudate lobes and atrophy of the right hepatic lobe. No enhancing mass. The portal veins remain patent. Stones are present within the gallbladder. No gallbladder dilatation or wall thickening. No biliary ductal dilatation. Pancreas: Unremarkable. No pancreatic ductal dilatation or surrounding inflammatory changes. Spleen: Borderline splenomegaly. Adrenals/Urinary Tract: Unremarkable adrenal glands. No enhancing renal mass, hydronephrosis or nephrolithiasis. Ureters are unremarkable. Bladder is only partially distended but unremarkable for degree of distention. Stomach/Bowel: No evidence of obstruction or focal bowel wall thickening. Vascular/Lymphatic: Atherosclerotic calcifications present throughout the abdominal aorta. No evidence of aneurysm. No evidence of a recurrent gastric varices. Metallic coils are present in the remnant of a left gastro renal shunt. The visceral veins are patent. Reproductive: Prostate is unremarkable. Other: Large volume ascites. Musculoskeletal: No acute fracture or aggressive appearing lytic or blastic osseous lesion. IMPRESSION: 1. Cirrhosis with portal hypertension and large volume ascites. 2. 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 No evidence of recurrent or residual gastric varices. 3. Cholelithiasis. 4.  Aortic Atherosclerosis (ICD10-170.0). Electronically Signed   By: Jacqulynn Cadet M.D.   On: 05/02/2017 16:31   US Paracentesis  Result Date: 05/03/2017 INDICATION: 83 year old with cirrhosis and ascites. Request for a diagnostic paracentesis. Patient is hypotensive. EXAM: ULTRASOUND GUIDED DIAGNOSTIC PARACENTESIS MEDICATIONS: None. COMPLICATIONS: None immediate. PROCEDURE: Informed written consent was obtained from the patient after a discussion of the risks, benefits and alternatives to treatment. A  timeout was performed prior to the initiation of the procedure. Initial ultrasound scanning demonstrates a large amount of ascites within the right lower abdominal quadrant. The right lower abdomen was prepped and draped in the usual sterile fashion. 1% lidocaine was used for local anesthesia. Following this, a 6 Fr Safe-T-Centesis catheter was introduced. An ultrasound image was saved for documentation purposes. The paracentesis was performed. Paracentesis was stopped after 2 L due to patient's hypotension. Blood pressure did not significantly change with the paracentesis. The catheter was removed and a dressing was applied. The patient tolerated the procedure well without immediate post procedural complication. FINDINGS: A total of approximately 2 L of yellow fluid was removed. Samples were sent to the laboratory as requested by the clinical team. IMPRESSION: Successful ultrasound-guided diagnostic paracentesis yielding 2 liters of peritoneal fluid. Electronically Signed   By: Markus Daft M.D.   On: 05/03/2017 12:12   US Abdomen Limited Ruq  Result Date: 05/02/2017 CLINICAL DATA:  Elevated alkaline phosphatase. Bilirubin 7.1. Abdominal distention. History of cirrhosis. EXAM: ULTRASOUND ABDOMEN LIMITED RIGHT UPPER QUADRANT COMPARISON:  CT of the abdomen and pelvis on 05/02/2017 FINDINGS: Gallbladder: Gallbladder is filled with stones and contracted around the stones. Largest stone measures approximately 11 millimeters. Gallbladder wall is thickened, 5.4 millimeters. No sonographic Murphy sign identified. Common bile duct: Diameter: 3.5 millimeters Liver: No focal lesion identified. Within normal limits in parenchymal echogenicity. Marked nodular contour the liver consistent with cirrhosis. No focal liver lesions are identified. Additional: Moderate volume ascites. IMPRESSION: 1. Cirrhotic changes in the liver. 2. Ascites. 3. Cholelithiasis and thickened gallbladder wall. No other evidence for acute cholecystitis.  Electronically Signed   By: [name] M.D.   On: 05/02/2017 17:46    Scheduled Meds: . allopurinol  300 mg Oral Daily  . cholecalciferol  1,000 Units Oral Daily  . docusate sodium  100 mg Oral BID  . ferrous sulfate  325 mg Oral Q breakfast  . folic acid  1 mg Oral Daily  . levothyroxine  75 mcg Oral QAC  breakfast  . LORazepam  0-4 mg Intravenous Q6H   Followed by  . LORazepam  0-4 mg Intravenous Q12H  . midodrine  5 mg Oral TID WC  . mometasone-formoterol  2 puff Inhalation BID  . multivitamin with minerals  1 tablet Oral Daily  . prednisoLONE  40 mg Oral QAC breakfast  . thiamine  100 mg Oral Daily   Or  . thiamine  100 mg Intravenous Daily   Continuous Infusions: . magnesium sulfate 1 - 4 g bolus IVPB Stopped (05/04/17 0150)  . meropenem (MERREM) IV Stopped (05/04/17 5176)    Assessment/Plan:  1. E. coli sepsis with multiorgan failure.  Continue IV antibiotics awaiting final culture results, peritoneal fluid shows no growth to date 2. Hypotension and lactic acidosis.  Start midodrine 3. Acute kidney injury secondary to hypotension and sepsis.  Holding antihypertensive medications.  Has urinary retention Foley will be placed 4. Alcoholic hepatitis with liver cirrhosis, jaundice, coagulopathy, ascites and thrombocytopenia and increased liver function test.   start prednisolone 40 mg daily. 5. Hypomagnesemia.  Recheck magnesium in the morning  6. worsening thrombocytopenia secondary to sepsis and  liver cirrhosis.  Discontinue heparin subcu.  TED hose 7. Hyponatremia secondary to alcohol abuse continue to monitor  Code Status:     Code Status Orders  (From admission, onward)        Start     Ordered   05/02/17 1836  Full code  Continuous     05/02/17 1838    Code Status History    Date Active Date Inactive Code Status Order ID Comments User Context   05/07/2013 1849 05/20/2013 1715 Full Code [id number]  Jacqulynn Cadet, MD Inpatient   05/06/2013 0109 05/07/2013  1849 Full Code [id number]  Germain Osgood, PA-C Inpatient    Advance Directive Documentation     Most Recent Value  Type of Advance Directive  Healthcare Power of Attorney  Pre-existing out of facility DNR order (yellow form or pink MOST form)  -  "MOST" Form in Place?  -     Family Communication: Wife at the bedside Disposition Plan: To be determined  Consultants:  Gastroenterology  Nephrology  Antibiotics:  Meropenem  Time spent: 28 minutes  Bre[facility]

## 2017-05-04 NOTE — Progress Notes (Signed)
Bladder scan showed 761 ml. Placed 14Fr Coude indwelling w/641ml cloudy straw colored urine out. Tol ok, painful due to urethra hard to get catheter thru initially.

## 2017-05-04 NOTE — Progress Notes (Addendum)
Jonathon Bellows , MD [address], [place], [place], [place], [address] [address], [place], [place], [place], [address] Phone: [phone]  Fax: [phone]   Joseph Hawkins is being followed for cirrhosis of liver   Subjective: No complaints    Objective: Vital signs in last 24 hours: Vitals:   05/03/17 2034 05/04/17 0445 05/04/17 0522 05/04/17 1311  BP: (!) 98/58  (!) 93/55 96/65  Pulse: 83  91 (!) 116  Resp: (!) 22  18   Temp: 98.5 F (36.9 C)  98.5 F (36.9 C) (!) 96.4 F (35.8 C)  TempSrc: Oral  Oral Axillary  SpO2: 97%  98% 99%  Weight:  207 lb 14.4 oz (94.3 kg)    Height:       Weight change: 1 lb 14.4 oz (0.862 kg)  Intake/Output Summary (Last 24 hours) at 05/04/2017 1353 Last data filed at 05/04/2017 1100 Gross per 24 hour  Intake 920 ml  Output 680 ml  Net 240 ml     Exam: Heart:: Regular rate and rhythm, S1S2 present or without murmur or extra heart sounds Lungs: normal, clear to auscultation and clear to auscultation and percussion Abdomen: mildly distended, soft , no tenderness, bs+   Lab Results: @LABTEST2 @ Micro Results: Recent Results (from the past 240 hour(s))  Culture, blood (routine x 2)     Status: Abnormal (Preliminary result)   Collection Time: 05/02/17  2:53 PM  Result Value Ref Range Status   Specimen Description   Final    BLOOD BLOOD LEFT HAND Performed at [facility], [address] [place], [place] [address]    Special Requests   Final    BOTTLES DRAWN AEROBIC AND ANAEROBIC Blood Culture results may not be optimal due to an inadequate volume of blood received in culture bottles Performed at [facility], [address], [place], [place] [address]    Culture  Setup Time   Final    GRAM NEGATIVE RODS IN BOTH AEROBIC AND ANAEROBIC BOTTLES CRITICAL RESULT CALLED TO, READ BACK BY AND VERIFIED WITH: MATT MCBANE @ 0206 ON 05/03/2017 BY CAF    Culture (A)  Final    ESCHERICHIA COLI SUSCEPTIBILITIES TO  FOLLOW Performed at [facility], [address] [place], [place] [address]    Report Status PENDING  Incomplete  Culture, blood (routine x 2)     Status: Abnormal (Preliminary result)   Collection Time: 05/02/17  2:53 PM  Result Value Ref Range Status   Specimen Description   Final    BLOOD RIGHT ANTECUBITAL Performed at [facility], [address], [place], [place] [address]    Special Requests   Final    Blood Culture results may not be optimal due to an inadequate volume of blood received in culture bottles Performed at [facility], [address] [place], [place] [address]    Culture  Setup Time   Final    GRAM NEGATIVE RODS IN BOTH AEROBIC AND ANAEROBIC BOTTLES CRITICAL RESULT CALLED TO, READ BACK BY AND VERIFIED WITH: MATT MCBANE @ 0206 ON 05/03/2017 BY CAF Performed at [facility], [address] [place], [place] [address]    Culture ESCHERICHIA COLI (A)  Final   Report Status PENDING  Incomplete  C difficile quick scan w PCR reflex     Status: None   Collection Time: 05/02/17  2:53 PM  Result Value Ref Range Status   C Diff antigen NEGATIVE NEGATIVE Final  C Diff toxin NEGATIVE NEGATIVE Final   C Diff interpretation No C. difficile detected.  Final    Comment: Performed at [facility], [address] [place], [place] [address]  Blood Culture ID Panel (Reflexed)     Status: Abnormal   Collection Time: 05/02/17  2:53 PM  Result Value Ref Range Status   Enterococcus species NOT DETECTED NOT DETECTED Final   Listeria monocytogenes NOT DETECTED NOT DETECTED Final   Staphylococcus species NOT DETECTED NOT DETECTED Final   Staphylococcus aureus NOT DETECTED NOT DETECTED Final   Streptococcus species NOT DETECTED NOT DETECTED Final   Streptococcus agalactiae NOT DETECTED NOT DETECTED Final   Streptococcus pneumoniae NOT DETECTED NOT DETECTED Final   Streptococcus pyogenes NOT DETECTED NOT DETECTED Final   Acinetobacter  baumannii NOT DETECTED NOT DETECTED Final   Enterobacteriaceae species DETECTED (A) NOT DETECTED Final    Comment: Enterobacteriaceae represent a large family of gram-negative bacteria, not a single organism. CRITICAL RESULT CALLED TO, READ BACK BY AND VERIFIED WITH: MATT MCBANE @ 0206 ON 05/03/2017 BY CAF    Enterobacter cloacae complex NOT DETECTED NOT DETECTED Final   Escherichia coli DETECTED (A) NOT DETECTED Final    Comment: CRITICAL RESULT CALLED TO, READ BACK BY AND VERIFIED WITH: MATT MCBANE @ 0206 ON 05/03/2017 BY CAF    Klebsiella oxytoca NOT DETECTED NOT DETECTED Final   Klebsiella pneumoniae NOT DETECTED NOT DETECTED Final   Proteus species NOT DETECTED NOT DETECTED Final   Serratia marcescens NOT DETECTED NOT DETECTED Final   Carbapenem resistance NOT DETECTED NOT DETECTED Final   Haemophilus influenzae NOT DETECTED NOT DETECTED Final   Neisseria meningitidis NOT DETECTED NOT DETECTED Final   Pseudomonas aeruginosa NOT DETECTED NOT DETECTED Final   Candida albicans NOT DETECTED NOT DETECTED Final   Candida glabrata NOT DETECTED NOT DETECTED Final   Candida krusei NOT DETECTED NOT DETECTED Final   Candida parapsilosis NOT DETECTED NOT DETECTED Final   Candida tropicalis NOT DETECTED NOT DETECTED Final    Comment: Performed at [facility], [address], [place], [place] [address]  Urine Culture     Status: Abnormal   Collection Time: 05/03/17  9:16 AM  Result Value Ref Range Status   Specimen Description   Final    URINE, RANDOM Performed at [facility], [address] [place], [place] [address]    Special Requests   Final    NONE Performed at [facility], [address] [place], [place] [address]    Culture (A)  Final    <10,000 COLONIES/mL INSIGNIFICANT GROWTH Performed at [facility], [place] [address], [place], [place] [address]    Report Status 05/04/2017 FINAL  Final  Body fluid culture     Status: None (Preliminary  result)   Collection Time: 05/03/17 11:30 AM  Result Value Ref Range Status   Specimen Description   Final    PERITONEAL Performed at [facility], [address] [place], [place] [address]    Special Requests   Final    NONE Performed at [facility], [place], [place] [address]    Gram Stain   Final    RARE WBC PRESENT,BOTH PMN AND MONONUCLEAR NO ORGANISMS SEEN    Culture   Final    NO GROWTH 1 DAY Performed at [facility], [place] [address], [place], [place] [address]    Report Status PENDING  Incomplete   Studies/Results: Dg Chest  2 View  Result Date: 05/02/2017 CLINICAL DATA:  Fever, cough, diarrhea for 2 days EXAM: CHEST - 2 VIEW COMPARISON:  Portable chest x-ray of 05/16/2013 FINDINGS: No active infiltrate or effusion is seen. The lungs are not optimally aerated. Mediastinal and hilar contours are unremarkable. The heart is mildly enlarged. No acute bony abnormality is seen. IMPRESSION: No active lung disease.  Borderline to mild cardiomegaly. Electronically Signed   By: Ivar Drape M.D.   On: 05/02/2017 15:21   Ct Abdomen Pelvis W Contrast  Result Date: 05/02/2017 CLINICAL DATA:  83 year old male with fever, chills and diarrhea EXAM: CT ABDOMEN AND PELVIS WITH CONTRAST TECHNIQUE: Multidetector CT imaging of the abdomen and pelvis was performed using the standard protocol following bolus administration of intravenous contrast. CONTRAST:  4mL OMNIPAQUE IOHEXOL 300 MG/ML  SOLN COMPARISON:  Prior CT scan of the abdomen and pelvis 05/09/2013 FINDINGS: Lower chest: The lung bases are clear. Visualized cardiac structures are within normal limits for size. No pericardial effusion. Unremarkable visualized distal thoracic esophagus. Hepatobiliary: The liver is relatively small and demonstrates a diffusely nodular contour. There is relative hypertrophy of the left and caudate lobes and atrophy of the right hepatic lobe. No enhancing mass. The portal  veins remain patent. Stones are present within the gallbladder. No gallbladder dilatation or wall thickening. No biliary ductal dilatation. Pancreas: Unremarkable. No pancreatic ductal dilatation or surrounding inflammatory changes. Spleen: Borderline splenomegaly. Adrenals/Urinary Tract: Unremarkable adrenal glands. No enhancing renal mass, hydronephrosis or nephrolithiasis. Ureters are unremarkable. Bladder is only partially distended but unremarkable for degree of distention. Stomach/Bowel: No evidence of obstruction or focal bowel wall thickening. Vascular/Lymphatic: Atherosclerotic calcifications present throughout the abdominal aorta. No evidence of aneurysm. No evidence of a recurrent gastric varices. Metallic coils are present in the remnant of a left gastro renal shunt. The visceral veins are patent. Reproductive: Prostate is unremarkable. Other: Large volume ascites. Musculoskeletal: No acute fracture or aggressive appearing lytic or blastic osseous lesion. IMPRESSION: 1. Cirrhosis with portal hypertension and large volume ascites. 2. 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 No evidence of recurrent or residual gastric varices. 3. Cholelithiasis. 4.  Aortic Atherosclerosis (ICD10-170.0). Electronically Signed   By: Jacqulynn Cadet M.D.   On: 05/02/2017 16:31   US Paracentesis  Result Date: 05/03/2017 INDICATION: 83 year old with cirrhosis and ascites. Request for a diagnostic paracentesis. Patient is hypotensive. EXAM: ULTRASOUND GUIDED DIAGNOSTIC PARACENTESIS MEDICATIONS: None. COMPLICATIONS: None immediate. PROCEDURE: Informed written consent was obtained from the patient after a discussion of the risks, benefits and alternatives to treatment. A timeout was performed prior to the initiation of the procedure. Initial ultrasound scanning demonstrates a large amount of ascites within the right lower abdominal quadrant. The right lower abdomen  was prepped and draped in the usual sterile fashion. 1% lidocaine was used for local anesthesia. Following this, a 6 Fr Safe-T-Centesis catheter was introduced. An ultrasound image was saved for documentation purposes. The paracentesis was performed. Paracentesis was stopped after 2 L due to patient's hypotension. Blood pressure did not significantly change with the paracentesis. The catheter was removed and a dressing was applied. The patient tolerated the procedure well without immediate post procedural complication. FINDINGS: A total of approximately 2 L of yellow fluid was removed. Samples were sent to the laboratory as requested by the clinical team. IMPRESSION: Successful ultrasound-guided diagnostic paracentesis yielding 2 liters of peritoneal fluid. Electronically Signed   By: Markus Daft M.D.   On: 05/03/2017 12:12   US Abdomen  Limited Ruq  Result Date: 05/02/2017 CLINICAL DATA:  Elevated alkaline phosphatase. Bilirubin 7.1. Abdominal distention. History of cirrhosis. EXAM: ULTRASOUND ABDOMEN LIMITED RIGHT UPPER QUADRANT COMPARISON:  CT of the abdomen and pelvis on 05/02/2017 FINDINGS: Gallbladder: Gallbladder is filled with stones and contracted around the stones. Largest stone measures approximately 11 millimeters. Gallbladder wall is thickened, 5.4 millimeters. No sonographic Murphy sign identified. Common bile duct: Diameter: 3.5 millimeters Liver: No focal lesion identified. Within normal limits in parenchymal echogenicity. Marked nodular contour the liver consistent with cirrhosis. No focal liver lesions are identified. Additional: Moderate volume ascites. IMPRESSION: 1. Cirrhotic changes in the liver. 2. Ascites. 3. Cholelithiasis and thickened gallbladder wall. No other evidence for acute cholecystitis. Electronically Signed   By: [name] M.D.   On: 05/02/2017 17:46   Medications: I have reviewed the patient's current medications. Scheduled Meds: . allopurinol  300 mg Oral Daily  .  cholecalciferol  1,000 Units Oral Daily  . docusate sodium  100 mg Oral BID  . ferrous sulfate  325 mg Oral Q breakfast  . folic acid  1 mg Oral Daily  . levothyroxine  75 mcg Oral QAC breakfast  . LORazepam  0-4 mg Intravenous Q6H   Followed by  . LORazepam  0-4 mg Intravenous Q12H  . midodrine  5 mg Oral TID WC  . mometasone-formoterol  2 puff Inhalation BID  . multivitamin with minerals  1 tablet Oral Daily  . prednisoLONE  40 mg Oral QAC breakfast  . thiamine  100 mg Oral Daily   Or  . thiamine  100 mg Intravenous Daily   Continuous Infusions: . magnesium sulfate 1 - 4 g bolus IVPB Stopped (05/04/17 0150)  . meropenem (MERREM) IV Stopped (05/04/17 0508)   PRN Meds:.acetaminophen **OR** acetaminophen, albuterol, bisacodyl, HYDROcodone-acetaminophen, iopamidol, LORazepam **OR** LORazepam, ondansetron **OR** ondansetron (ZOFRAN) IV, traZODone   Assessment: Active Problems:   Sepsis (HCC)   [name] 83 y.o. male with E coli sepsis . Complicated with AKI. 2L ascites drained yesterday. Ascitic fluid culture no growth. Unclear why ascitic fluid was not checked for cell count to rule out SBP. 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 Replace all low electrolytes such as magnesium  2. With sepsis I would not recommend steroids to treat  alcoholic hepatitis. In fact its a contraindication. Rather he would benefit from Trental which helps with hepato renal syndrome 400 mg Once a day  3. Ascitic fluid can be falsely negative if drawn after antibiotics. In addition one can have culture negative- neutrocytic ascitic which can only be diagnosed with an ascitic fluid cell count.  4. Stop all alcohol  5. Agree with nephrology consult - if felt he has hepato renal syndrome he would benefit from albumin, octreotide and midodrine combination .  6. Low salt diet  7. Stop all alcohol  8. Overall very poor prognosis.  9. Would benefit from palliative care consult to discuss  advance directives and patients wishes for goals of care if he would to worsen.  10. No diuretics.  11. Check for acute Hep A/B/C/HIV/EBV,CMV,HSV.    LOS: 2 days   Jonathon Bellows, MD 05/04/2017, 1:53 PM

## 2017-05-04 NOTE — Consult Note (Signed)
Date: 05/04/2017                  Patient Name:  Joseph Hawkins  MRN: [id number]  DOB: 19-Nov-1934  Age / Sex: 83 y.o., male         PCP: Baxter Hire, MD                 Service Requesting Consult: IM/ Dustin Flock, MD                 Reason for Consult: ARF            History of Present Illness: Patient is a 83 y.o. male with medical problems of Alcohol abuse, hypertension, myelodysplastic syndrome, who was admitted to [facility] on 05/02/2017 for evaluation of fever, cough, diarrhea for 2 days prior to admission.  He was found to have worsening creatinine.  His baseline creatinine is 1.2 from April 15.  It has been worsening recently.  Today's creatinine has increased to 2.54 Patient underwent paracentesis with 2 L of fluid was removed Today, he states he feels weak    Medications: Outpatient medications: Medications Prior to Admission  Medication Sig Dispense Refill Last Dose  . allopurinol (ZYLOPRIM) 300 MG tablet Take 300 mg by mouth daily.   05/02/2017 at 0800  . cholecalciferol (VITAMIN D) 1000 UNITS tablet Take 1,000 Units by mouth daily.   05/02/2017 at 0800  . ferrous sulfate (FERROUSUL) 325 (65 FE) MG tablet Take 325 mg by mouth daily with breakfast.   05/02/2017 at 0800  . folic acid (FOLVITE) 1 MG tablet Take 1 tablet (1 mg total) by mouth daily.   05/02/2017 at 0800  . furosemide (LASIX) 40 MG tablet Take 1 tablet (40 mg total) by mouth daily. 30 tablet  05/02/2017 at 0800  . levothyroxine (SYNTHROID, LEVOTHROID) 75 MCG tablet Take 75 mcg by mouth daily before breakfast.   05/02/2017 at 0800  . nadolol (CORGARD) 40 MG tablet Take 40 mg by mouth daily.   05/01/2017 at 1700  . pantoprazole (PROTONIX) 40 MG tablet Take 1 tablet (40 mg total) by mouth daily. (Patient taking differently: Take 40 mg by mouth 2 (two) times daily. )   05/02/2017 at 0800  . spironolactone (ALDACTONE) 50 MG tablet Take 50 mg by mouth daily.   05/02/2017 at 0800  . thiamine 100 MG tablet Take 1 tablet (100 mg total)  by mouth daily.   05/02/2017 at 0800  . albuterol (PROVENTIL) (2.5 MG/3ML) 0.083% nebulizer solution Take 3 mLs (2.5 mg total) by nebulization every 6 (six) hours as needed for wheezing or shortness of breath. (Patient not taking: Reported on 05/02/2017) 75 mL 12 Not Taking at Unknown time  . mometasone-formoterol (DULERA) 100-5 MCG/ACT AERO Inhale 2 puffs into the lungs 2 (two) times daily. (Patient not taking: Reported on 05/02/2017)   Not Taking at Unknown time  . tamsulosin (FLOMAX) 0.4 MG CAPS capsule Take 1 capsule (0.4 mg total) by mouth daily. (Patient not taking: Reported on 05/02/2017) 30 capsule  Not Taking at Unknown time    Current medications: Current Facility-Administered Medications  Medication Dose Route Frequency Provider Last Rate Last Dose  . acetaminophen (TYLENOL) tablet 650 mg  650 mg Oral Q6H PRN Epifanio Lesches, MD       Or  . acetaminophen (TYLENOL) suppository 650 mg  650 mg Rectal Q6H PRN Epifanio Lesches, MD      . albuterol (PROVENTIL) (2.5 MG/3ML) 0.083% nebulizer solution 2.5 mg  2.5 mg Nebulization Q6H PRN Epifanio Lesches, MD      . allopurinol (ZYLOPRIM) tablet 300 mg  300 mg Oral Daily Epifanio Lesches, MD   300 mg at 05/04/17 0841  . bisacodyl (DULCOLAX) EC tablet 5 mg  5 mg Oral Daily PRN Epifanio Lesches, MD      . cholecalciferol (VITAMIN D) tablet 1,000 Units  1,000 Units Oral Daily Epifanio Lesches, MD   1,000 Units at 05/04/17 [phone]  . docusate sodium (COLACE) capsule 100 mg  100 mg Oral BID Epifanio Lesches, MD   100 mg at 05/03/17 2129  . ferrous sulfate tablet 325 mg  325 mg Oral Q breakfast Epifanio Lesches, MD   325 mg at 05/04/17 0840  . folic acid (FOLVITE) tablet 1 mg  1 mg Oral Daily Epifanio Lesches, MD   1 mg at 05/04/17 0842  . HYDROcodone-acetaminophen (NORCO/VICODIN) 5-325 MG per tablet 1-2 tablet  1-2 tablet Oral Q4H PRN Epifanio Lesches, MD      . iopamidol (ISOVUE-300) 61 % injection 30 mL  30 mL Oral Once  PRN Nena Polio, MD      . levothyroxine (SYNTHROID, LEVOTHROID) tablet 75 mcg  75 mcg Oral QAC breakfast Epifanio Lesches, MD   75 mcg at 05/04/17 [phone]  . LORazepam (ATIVAN) injection 0-4 mg  0-4 mg Intravenous Q6H Epifanio Lesches, MD       Followed by  . LORazepam (ATIVAN) injection 0-4 mg  0-4 mg Intravenous Q12H Epifanio Lesches, MD      . LORazepam (ATIVAN) tablet 1 mg  1 mg Oral Q6H PRN Epifanio Lesches, MD       Or  . LORazepam (ATIVAN) injection 1 mg  1 mg Intravenous Q6H PRN Epifanio Lesches, MD      . magnesium sulfate IVPB 4 g 100 mL  4 g Intravenous Once Loletha Grayer, MD   Stopped at 05/04/17 0150  . meropenem (MERREM) 1 g in sodium chloride 0.9 % 100 mL IVPB  1 g Intravenous Q12H Lance Coon, MD   Stopped at 05/04/17 [phone]  . midodrine (PROAMATINE) tablet 5 mg  5 mg Oral TID WC Dustin Flock, MD   5 mg at 05/04/17 1148  . mometasone-formoterol (DULERA) 100-5 MCG/ACT inhaler 2 puff  2 puff Inhalation BID Epifanio Lesches, MD   2 puff at 05/04/17 0847  . multivitamin with minerals tablet 1 tablet  1 tablet Oral Daily Epifanio Lesches, MD   1 tablet at 05/04/17 0840  . ondansetron (ZOFRAN) tablet 4 mg  4 mg Oral Q6H PRN Epifanio Lesches, MD       Or  . ondansetron (ZOFRAN) injection 4 mg  4 mg Intravenous Q6H PRN Epifanio Lesches, MD      . prednisoLONE (ORAPRED) 15 MG/5ML solution 40 mg  40 mg Oral QAC breakfast Loletha Grayer, MD   40 mg at 05/04/17 0848  . thiamine (VITAMIN B-1) tablet 100 mg  100 mg Oral Daily Epifanio Lesches, MD   100 mg at 05/04/17 0840   Or  . thiamine (B-1) injection 100 mg  100 mg Intravenous Daily Epifanio Lesches, MD   100 mg at 05/04/17 0842  . traZODone (DESYREL) tablet 25 mg  25 mg Oral QHS PRN Epifanio Lesches, MD          Allergies: No Known Allergies    Past Medical History: Past Medical History:  Diagnosis Date  . Anemia   . Chronic kidney disease   . Hypertension   . MDS  (myelodysplastic  syndrome) ([place]) 08/02/2015     Past Surgical History: Past Surgical History:  Procedure Laterality Date  . RADIOLOGY WITH ANESTHESIA N/A 05/07/2013   Procedure: RADIOLOGY WITH ANESTHESIA;  Surgeon: Jacqulynn Cadet, MD;  Location: [facility];  Service: Radiology;  Laterality: N/A;     Family History: Family History  Family history unknown: Yes     Social History: Social History   Socioeconomic History  . Marital status: Married    Spouse name: Not on file  . Number of children: Not on file  . Years of education: Not on file  . Highest education level: Not on file  Occupational History  . Not on file  Social Needs  . Financial resource strain: Not on file  . Food insecurity:    Worry: Not on file    Inability: Not on file  . Transportation needs:    Medical: Not on file    Non-medical: Not on file  Tobacco Use  . Smoking status: Never Smoker  . Smokeless tobacco: Never Used  Substance and Sexual Activity  . Alcohol use: Yes    Alcohol/week: 2.4 oz    Types: 4 Shots of liquor per week    Comment: 4 shots of Liquor a day  . Drug use: No  . Sexual activity: Never  Lifestyle  . Physical activity:    Days per week: Not on file    Minutes per session: Not on file  . Stress: Not on file  Relationships  . Social connections:    Talks on phone: Not on file    Gets together: Not on file    Attends religious service: Not on file    Active member of club or organization: Not on file    Attends meetings of clubs or organizations: Not on file    Relationship status: Not on file  . Intimate partner violence:    Fear of current or ex partner: Not on file    Emotionally abused: Not on file    Physically abused: Not on file    Forced sexual activity: Not on file  Other Topics Concern  . Not on file  Social History Narrative  . Not on file     Review of Systems: Gen: Fevers chills at home HEENT: Denies any vision or hearing problems CV: No chest pain or  shortness of breath Resp: Some cough, nonproductive GI: Appetite is fair GU : Denies any problems with voiding MS: No complaints Derm:  No complaints Psych: No complaints Heme: No complaints Neuro: No complaints Endocrine.  No complaints  Vital Signs: Blood pressure 96/65, pulse (!) 116, temperature (!) 96.4 F (35.8 C), temperature source Axillary, resp. rate 18, height 6' (1.829 m), weight 207 lb 14.4 oz (94.3 kg), SpO2 99 %.   Intake/Output Summary (Last 24 hours) at 05/04/2017 1345 Last data filed at 05/04/2017 1100 Gross per 24 hour  Intake 920 ml  Output 680 ml  Net 240 ml    Weight trends: Filed Weights   05/02/17 2020 05/03/17 0310 05/04/17 0445  Weight: 208 lb (94.3 kg) 208 lb 12.4 oz (94.7 kg) 207 lb 14.4 oz (94.3 kg)    Physical Exam: General:  Chronically ill-appearing, laying in the bed  HEENT  moist oral mucous membranes  Neck:  Supple  Lungs:  Normal breathing effort, no wheezing or crackles  Heart::  Irregular rhythm, tachycardic  Abdomen:  Distended, ascites  Extremities:  2-3+ dependent pitting edema  Neurologic:  Alert, able to follow commands  Skin:  No acute rashes             Lab results: Basic Metabolic Panel: Recent Labs  Lab 05/02/17 1452 05/03/17 0545 05/04/17 0550  NA 131* 130* 128*  K 3.6 3.7 3.3*  CL 99* 98* 97*  CO2 21* 22 22  GLUCOSE 129* 110* 97  BUN 15 21* 33*  CREATININE 1.61* 2.17* 2.54*  CALCIUM 8.4* 7.9* 7.6*  MG  --  1.0*  --   PHOS  --  3.8  --     Liver Function Tests: Recent Labs  Lab 05/04/17 0550  AST 51*  ALT 21  ALKPHOS 162*  BILITOT 5.7*  PROT 6.0*  ALBUMIN 1.6*   Recent Labs  Lab 05/02/17 1452  LIPASE 38   No results for input(s): AMMONIA in the last 168 hours.  CBC: Recent Labs  Lab 05/02/17 1452 05/03/17 0545 05/04/17 0550  WBC 12.1* 17.3* 11.2*  NEUTROABS 10.1*  --   --   HGB 12.7* 12.1* 11.5*  HCT 37.1* 34.9* 33.1*  MCV 117.9* 118.2* 116.8*  PLT 92* 76* 70*    Cardiac  Enzymes: Recent Labs  Lab 05/02/17 1452  TROPONINI <0.03    BNP: Invalid input(s): POCBNP  CBG: Recent Labs  Lab 05/02/17 2155 05/02/17 2227 05/03/17 0810 05/04/17 0750  GLUCAP 66 88 106* 11    Microbiology: Recent Results (from the past 720 hour(s))  Culture, blood (routine x 2)     Status: Abnormal (Preliminary result)   Collection Time: 05/02/17  2:53 PM  Result Value Ref Range Status   Specimen Description   Final    BLOOD BLOOD LEFT HAND Performed at [facility], [address] [place], [place] [address]    Special Requests   Final    BOTTLES DRAWN AEROBIC AND ANAEROBIC Blood Culture results may not be optimal due to an inadequate volume of blood received in culture bottles Performed at [facility], [address], [place], [place] [address]    Culture  Setup Time   Final    GRAM NEGATIVE RODS IN BOTH AEROBIC AND ANAEROBIC BOTTLES CRITICAL RESULT CALLED TO, READ BACK BY AND VERIFIED WITH: MATT MCBANE @ 0206 ON 05/03/2017 BY CAF    Culture (A)  Final    ESCHERICHIA COLI SUSCEPTIBILITIES TO FOLLOW Performed at [facility], [place] [address] [place], [place] [address]    Report Status PENDING  Incomplete  Culture, blood (routine x 2)     Status: Abnormal (Preliminary result)   Collection Time: 05/02/17  2:53 PM  Result Value Ref Range Status   Specimen Description   Final    BLOOD RIGHT ANTECUBITAL Performed at [facility], [address], [place], [place] [address]    Special Requests   Final    Blood Culture results may not be optimal due to an inadequate volume of blood received in culture bottles Performed at [facility], [address] [place], [place] [address]    Culture  Setup Time   Final    GRAM NEGATIVE RODS IN BOTH AEROBIC AND ANAEROBIC BOTTLES CRITICAL RESULT CALLED TO, READ BACK BY AND VERIFIED WITH: MATT MCBANE @ 0206 ON 05/03/2017 BY CAF Performed at [facility], [place] [place],  [address]    Culture ESCHERICHIA COLI (A)  Final   Report Status PENDING  Incomplete  C difficile quick scan w PCR reflex     Status: None   Collection Time: 05/02/17  2:53 PM  Result Value Ref Range Status   C Diff antigen NEGATIVE NEGATIVE Final   C Diff toxin NEGATIVE NEGATIVE Final   C Diff interpretation No C. difficile detected.  Final    Comment: Performed at [facility], [address] [place], [place] [address]  Blood Culture ID Panel (Reflexed)     Status: Abnormal   Collection Time: 05/02/17  2:53 PM  Result Value Ref Range Status   Enterococcus species NOT DETECTED NOT DETECTED Final   Listeria monocytogenes NOT DETECTED NOT DETECTED Final   Staphylococcus species NOT DETECTED NOT DETECTED Final   Staphylococcus aureus NOT DETECTED NOT DETECTED Final   Streptococcus species NOT DETECTED NOT DETECTED Final   Streptococcus agalactiae NOT DETECTED NOT DETECTED Final   Streptococcus pneumoniae NOT DETECTED NOT DETECTED Final   Streptococcus pyogenes NOT DETECTED NOT DETECTED Final   Acinetobacter baumannii NOT DETECTED NOT DETECTED Final   Enterobacteriaceae species DETECTED (A) NOT DETECTED Final    Comment: Enterobacteriaceae represent a large family of gram-negative bacteria, not a single organism. CRITICAL RESULT CALLED TO, READ BACK BY AND VERIFIED WITH: MATT MCBANE @ 0206 ON 05/03/2017 BY CAF    Enterobacter cloacae complex NOT DETECTED NOT DETECTED Final   Escherichia coli DETECTED (A) NOT DETECTED Final    Comment: CRITICAL RESULT CALLED TO, READ BACK BY AND VERIFIED WITH: MATT MCBANE @ 0206 ON 05/03/2017 BY CAF    Klebsiella oxytoca NOT DETECTED NOT DETECTED Final   Klebsiella pneumoniae NOT DETECTED NOT DETECTED Final   Proteus species NOT DETECTED NOT DETECTED Final   Serratia marcescens NOT DETECTED NOT DETECTED Final   Carbapenem resistance NOT DETECTED NOT DETECTED Final   Haemophilus influenzae NOT DETECTED NOT DETECTED Final    Neisseria meningitidis NOT DETECTED NOT DETECTED Final   Pseudomonas aeruginosa NOT DETECTED NOT DETECTED Final   Candida albicans NOT DETECTED NOT DETECTED Final   Candida glabrata NOT DETECTED NOT DETECTED Final   Candida krusei NOT DETECTED NOT DETECTED Final   Candida parapsilosis NOT DETECTED NOT DETECTED Final   Candida tropicalis NOT DETECTED NOT DETECTED Final    Comment: Performed at [facility] [address] [place], [place] [address]  Urine Culture     Status: Abnormal   Collection Time: 05/03/17  9:16 AM  Result Value Ref Range Status   Specimen Description   Final    URINE, RANDOM Performed at [facility], [address], [place], [place] [address]    Special Requests   Final    NONE Performed at [facility], [address], [place], [place] [address]    Culture (A)  Final    <10,000 COLONIES/mL INSIGNIFICANT GROWTH Performed at [facility], [place] [address], [place], [place] [address]    Report Status 05/04/2017 FINAL  Final  Body fluid culture     Status: None (Preliminary result)   Collection Time: 05/03/17 11:30 AM  Result Value Ref Range Status   Specimen Description   Final    PERITONEAL Performed at [facility], [address], [place], [place] [address]    Special Requests   Final    NONE Performed at [facility], [place],  [address]    Gram Stain   Final    RARE WBC PRESENT,BOTH PMN AND MONONUCLEAR NO ORGANISMS SEEN    Culture   Final    NO GROWTH 1 DAY Performed at [facility], [place]  [address] [place], [place] [address]    Report Status PENDING  Incomplete     Coagulation Studies: Recent Labs    05/02/17 2039/04/07  LABPROT 21.4*  INR 1.87    Urinalysis: Recent Labs    05/03/17 0915  COLORURINE AMBER*  LABSPEC 1.036*  PHURINE 5.0  GLUCOSEU NEGATIVE  HGBUR MODERATE*  BILIRUBINUR SMALL*  KETONESUR NEGATIVE  PROTEINUR NEGATIVE  NITRITE NEGATIVE   LEUKOCYTESUR MODERATE*        Imaging: Dg Chest 2 View  Result Date: 05/02/2017 CLINICAL DATA:  Fever, cough, diarrhea for 2 days EXAM: CHEST - 2 VIEW COMPARISON:  Portable chest x-ray of 05/16/2013 FINDINGS: No active infiltrate or effusion is seen. The lungs are not optimally aerated. Mediastinal and hilar contours are unremarkable. The heart is mildly enlarged. No acute bony abnormality is seen. IMPRESSION: No active lung disease.  Borderline to mild cardiomegaly. Electronically Signed   By: Ivar Drape M.D.   On: 05/02/2017 15:21   Ct Abdomen Pelvis W Contrast  Result Date: 05/02/2017 CLINICAL DATA:  83 year old male with fever, chills and diarrhea EXAM: CT ABDOMEN AND PELVIS WITH CONTRAST TECHNIQUE: Multidetector CT imaging of the abdomen and pelvis was performed using the standard protocol following bolus administration of intravenous contrast. CONTRAST:  69mL OMNIPAQUE IOHEXOL 300 MG/ML  SOLN COMPARISON:  Prior CT scan of the abdomen and pelvis 05/09/2013 FINDINGS: Lower chest: The lung bases are clear. Visualized cardiac structures are within normal limits for size. No pericardial effusion. Unremarkable visualized distal thoracic esophagus. Hepatobiliary: The liver is relatively small and demonstrates a diffusely nodular contour. There is relative hypertrophy of the left and caudate lobes and atrophy of the right hepatic lobe. No enhancing mass. The portal veins remain patent. Stones are present within the gallbladder. No gallbladder dilatation or wall thickening. No biliary ductal dilatation. Pancreas: Unremarkable. No pancreatic ductal dilatation or surrounding inflammatory changes. Spleen: Borderline splenomegaly. Adrenals/Urinary Tract: Unremarkable adrenal glands. No enhancing renal mass, hydronephrosis or nephrolithiasis. Ureters are unremarkable. Bladder is only partially distended but unremarkable for degree of distention. Stomach/Bowel: No evidence of obstruction or focal bowel wall  thickening. Vascular/Lymphatic: Atherosclerotic calcifications present throughout the abdominal aorta. No evidence of aneurysm. No evidence of a recurrent gastric varices. Metallic coils are present in the remnant of a left gastro renal shunt. The visceral veins are patent. Reproductive: Prostate is unremarkable. Other: Large volume ascites. Musculoskeletal: No acute fracture or aggressive appearing lytic or blastic osseous lesion. IMPRESSION: 1. Cirrhosis with portal hypertension and large volume ascites. 2. 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 No evidence of recurrent or residual gastric varices. 3. Cholelithiasis. 4.  Aortic Atherosclerosis (ICD10-170.0). Electronically Signed   By: Jacqulynn Cadet M.D.   On: 05/02/2017 16:31   US Paracentesis  Result Date: 05/03/2017 INDICATION: 83 year old with cirrhosis and ascites. Request for a diagnostic paracentesis. Patient is hypotensive. EXAM: ULTRASOUND GUIDED DIAGNOSTIC PARACENTESIS MEDICATIONS: None. COMPLICATIONS: None immediate. PROCEDURE: Informed written consent was obtained from the patient after a discussion of the risks, benefits and alternatives to treatment. A timeout was performed prior to the initiation of the procedure. Initial ultrasound scanning demonstrates a large amount of ascites within the right lower abdominal quadrant. The right lower abdomen was prepped and draped in the usual sterile fashion. 1% lidocaine was used for local anesthesia. Following this, a 6 Fr Safe-T-Centesis catheter was introduced. An ultrasound image was saved for documentation purposes. The paracentesis was performed. Paracentesis was stopped after 2 L due to patient's hypotension.  Blood pressure did not significantly change with the paracentesis. The catheter was removed and a dressing was applied. The patient tolerated the procedure well without immediate post procedural complication. FINDINGS: A total of  approximately 2 L of yellow fluid was removed. Samples were sent to the laboratory as requested by the clinical team. IMPRESSION: Successful ultrasound-guided diagnostic paracentesis yielding 2 liters of peritoneal fluid. Electronically Signed   By: Markus Daft M.D.   On: 05/03/2017 12:12   US Abdomen Limited Ruq  Result Date: 05/02/2017 CLINICAL DATA:  Elevated alkaline phosphatase. Bilirubin 7.1. Abdominal distention. History of cirrhosis. EXAM: ULTRASOUND ABDOMEN LIMITED RIGHT UPPER QUADRANT COMPARISON:  CT of the abdomen and pelvis on 05/02/2017 FINDINGS: Gallbladder: Gallbladder is filled with stones and contracted around the stones. Largest stone measures approximately 11 millimeters. Gallbladder wall is thickened, 5.4 millimeters. No sonographic Murphy sign identified. Common bile duct: Diameter: 3.5 millimeters Liver: No focal lesion identified. Within normal limits in parenchymal echogenicity. Marked nodular contour the liver consistent with cirrhosis. No focal liver lesions are identified. Additional: Moderate volume ascites. IMPRESSION: 1. Cirrhotic changes in the liver. 2. Ascites. 3. Cholelithiasis and thickened gallbladder wall. No other evidence for acute cholecystitis. Electronically Signed   By: [name] M.D.   On: 05/02/2017 17:46      Assessment & Plan: Pt is a 83 y.o. African-American  male with hypertension, history of myelodysplastic syndrome, cholelithiasis, cirrhosis of the liver, alcohol abuse, ascites, was admitted on 05/02/2017 with fevers.  Found to have severe ascites and worsening renal failure.  Underwent paracentesis on 5/3 with 2 L of fluid was removed.  Nephrology consult for acute renal failure  1.  Acute renal failure, likely multifactorial Patient had IV contrast exposure at admission for CT abdomen and pelvis.  This likely contributed to acute kidney injury.  Also possibility of ATN from ongoing infection/sepsis.   2.  E. Coli sepsis 3.  Urinary retention  4.   Cirrhosis of the liver/ascites  Continue to treat the infection as you are doing Electrolytes and volume status are acceptable Replace potassium as necessary Patient may have urinary retention.  Foley catheter placed.  Start Flomax.  Check PSA We will supplement IV albumin for colloid support      LOS: 2 Manasvini Whatley Candiss Norse 5/4/20191:45 [facility] [place], [place]  Note: This note was prepared with Dragon dictation. Any transcription errors are unintentional

## 2017-05-04 NOTE — Progress Notes (Signed)
Advanced care plan.  Purpose of the Encounter: CODE STATUS  Parties in [place] and his wife  Patient's Decision Capacity: intact  Subjective/Patient's story: Patient is 83 y.o with h/o alcohol abuse, CKD presenting with abdominal pain and hypotention   Objective/Medical story I discussed with patient regarding code status, cpr and intubation,  He wishes to be intubated and rescussitated   Goals of care determination: full code    CODE STATUS: full code   Time spent discussing advanced care planning: 16 minutes

## 2017-05-05 LAB — GLUCOSE, CAPILLARY
Glucose-Capillary: 108 mg/dL — ABNORMAL HIGH (ref 65–99)
Glucose-Capillary: 138 mg/dL — ABNORMAL HIGH (ref 65–99)

## 2017-05-05 LAB — COMPREHENSIVE METABOLIC PANEL
ALK PHOS: 159 U/L — AB (ref 38–126)
ALT: 19 U/L (ref 17–63)
ANION GAP: 10 (ref 5–15)
AST: 48 U/L — ABNORMAL HIGH (ref 15–41)
Albumin: 2.3 g/dL — ABNORMAL LOW (ref 3.5–5.0)
BUN: 44 mg/dL — ABNORMAL HIGH (ref 6–20)
CALCIUM: 8.3 mg/dL — AB (ref 8.9–10.3)
CO2: 22 mmol/L (ref 22–32)
CREATININE: 2.13 mg/dL — AB (ref 0.61–1.24)
Chloride: 97 mmol/L — ABNORMAL LOW (ref 101–111)
GFR, EST AFRICAN AMERICAN: 32 mL/min — AB (ref >60)
GFR, EST NON AFRICAN AMERICAN: 27 mL/min — AB (ref >60)
Glucose, Bld: 122 mg/dL — ABNORMAL HIGH (ref 65–99)
Potassium: 3.3 mmol/L — ABNORMAL LOW (ref 3.5–5.1)
SODIUM: 129 mmol/L — AB (ref 135–145)
Total Bilirubin: 5 mg/dL — ABNORMAL HIGH (ref 0.3–1.2)
Total Protein: 6.6 g/dL (ref 6.5–8.1)

## 2017-05-05 LAB — CBC
HCT: 32.6 % — ABNORMAL LOW (ref 40.0–52.0)
HEMOGLOBIN: 11.6 g/dL — AB (ref 13.0–18.0)
MCH: 41.6 pg — AB (ref 26.0–34.0)
MCHC: 35.5 g/dL (ref 32.0–36.0)
MCV: 117.4 fL — AB (ref 80.0–100.0)
PLATELETS: 75 10*3/uL — AB (ref 150–440)
RBC: 2.78 MIL/uL — AB (ref 4.40–5.90)
RDW: 16.3 % — ABNORMAL HIGH (ref 11.5–14.5)
WBC: 8.4 10*3/uL (ref 3.8–10.6)

## 2017-05-05 LAB — CULTURE, BLOOD (ROUTINE X 2)

## 2017-05-05 LAB — PHOSPHORUS: PHOSPHORUS: 3.3 mg/dL (ref 2.5–4.6)

## 2017-05-05 LAB — PSA: PROSTATIC SPECIFIC ANTIGEN: 1.57 ng/mL (ref 0.00–4.00)

## 2017-05-05 LAB — MAGNESIUM: MAGNESIUM: 2.2 mg/dL (ref 1.7–2.4)

## 2017-05-05 MED ORDER — CEFAZOLIN SODIUM-DEXTROSE 1-4 GM/50ML-% IV SOLN
1.0000 g | Freq: Two times a day (BID) | INTRAVENOUS | Status: DC
Start: 2017-05-05 — End: 2017-05-07
  Administered 2017-05-05 – 2017-05-07 (×5): 1 g via INTRAVENOUS
  Filled 2017-05-05 (×8): qty 50

## 2017-05-05 MED ORDER — PREDNISONE 20 MG PO TABS
40.0000 mg | ORAL_TABLET | Freq: Every day | ORAL | Status: DC
  Administered 2017-05-06 – 2017-05-07 (×2): 40 mg via ORAL
  Filled 2017-05-05 (×2): qty 2

## 2017-05-05 MED ORDER — POTASSIUM CHLORIDE CRYS ER 20 MEQ PO TBCR
40.0000 meq | EXTENDED_RELEASE_TABLET | ORAL | Status: AC
  Administered 2017-05-05 (×2): 40 meq via ORAL
  Filled 2017-05-05 (×2): qty 2

## 2017-05-05 NOTE — Progress Notes (Signed)
Patient ID: Joseph Hawkins, male   DOB: Apr 29, 1934, 83 y.o.   MRN: [id number]  [employer] PROGRESS NOTE  Joseph Hawkins WER:[id number] DOB: 12-04-1934 DOA: 05/02/2017 PCP: Baxter Hire, MD  HPI/Subjective: Feeling better abdominal pain resolved Objective: Vitals:   05/04/17 2015 05/05/17 0509  BP: (!) 96/55 100/73  Pulse: (!) 59 (!) 51  Resp: 18 18  Temp: 97.7 F (36.5 C) 97.8 F (36.6 C)  SpO2: 98% 97%    Filed Weights   05/03/17 0310 05/04/17 0445 05/05/17 0500  Weight: 94.7 kg (208 lb 12.4 oz) 94.3 kg (207 lb 14.4 oz) 95 kg (209 lb 7 oz)    ROS: Review of Systems  Constitutional: Negative for chills and fever.  Eyes: Negative for blurred vision.  Respiratory: Negative for cough and shortness of breath.   Cardiovascular: Negative for chest pain.  Gastrointestinal: Negative for abdominal pain, constipation, diarrhea, nausea and vomiting.  Genitourinary: Negative for dysuria.  Musculoskeletal: Negative for joint pain.  Neurological: Negative for dizziness and headaches.   Exam: Physical Exam  Constitutional: He is oriented to person, place, and time.  HENT:  Nose: No mucosal edema.  Mouth/Throat: No oropharyngeal exudate or posterior oropharyngeal edema.  Eyes: Pupils are equal, round, and reactive to light. Conjunctivae, EOM and lids are normal.  Neck: No JVD present. Carotid bruit is not present. No edema present. No thyroid mass and no thyromegaly present.  Cardiovascular: S1 normal and S2 normal. Exam reveals no gallop.  No murmur heard. Pulses:      Dorsalis pedis pulses are 2+ on the right side, and 2+ on the left side.  Respiratory: No respiratory distress. He has decreased breath sounds in the right lower field and the left lower field. He has no wheezes. He has no rhonchi. He has no rales.  GI: Soft. Bowel sounds are normal. He exhibits distension. There is no tenderness.  Musculoskeletal:       Right ankle: He exhibits swelling.       Left ankle: He  exhibits swelling.  Lymphadenopathy:    He has no cervical adenopathy.  Neurological: He is alert and oriented to person, place, and time. No cranial nerve deficit.  Skin: Skin is warm. No rash noted. Nails show no clubbing.  Psychiatric: He has a normal mood and affect.      Data Reviewed: Basic Metabolic Panel: Recent Labs  Lab 05/02/17 1452 05/03/17 0545 05/04/17 0550 05/05/17 0527  NA 131* 130* 128* 129*  K 3.6 3.7 3.3* 3.3*  CL 99* 98* 97* 97*  CO2 21* 22 22 22   GLUCOSE 129* 110* 97 122*  BUN 15 21* 33* 44*  CREATININE 1.61* 2.17* 2.54* 2.13*  CALCIUM 8.4* 7.9* 7.6* 8.3*  MG  --  1.0*  --  2.2  PHOS  --  3.8  --  3.3   Liver Function Tests: Recent Labs  Lab 05/02/17 1452 05/04/17 0550 05/05/17 0527  AST 67* 51* 48*  ALT 24 21 19   ALKPHOS 235* 162* 159*  BILITOT 7.1* 5.7* 5.0*  PROT 7.2 6.0* 6.6  ALBUMIN 2.0* 1.6* 2.3*   Recent Labs  Lab 05/02/17 1452  LIPASE 38   CBC: Recent Labs  Lab 05/02/17 1452 05/03/17 0545 05/04/17 0550 05/05/17 0527  WBC 12.1* 17.3* 11.2* 8.4  NEUTROABS 10.1*  --   --   --   HGB 12.7* 12.1* 11.5* 11.6*  HCT 37.1* 34.9* 33.1* 32.6*  MCV 117.9* 118.2* 116.8* 117.4*  PLT 92* 76* 70*  75*   Cardiac Enzymes: Recent Labs  Lab 05/02/17 1452  TROPONINI <0.03    CBG: Recent Labs  Lab 05/02/17 2227 05/03/17 0810 05/04/17 0750 05/05/17 0753 05/05/17 1159  GLUCAP 88 106* 78 108* 138*    Recent Results (from the past 240 hour(s))  Culture, blood (routine x 2)     Status: Abnormal   Collection Time: 05/02/17  2:53 PM  Result Value Ref Range Status   Specimen Description   Final    BLOOD BLOOD LEFT HAND Performed at [facility], [address], [place], [place] [address]    Special Requests   Final    BOTTLES DRAWN AEROBIC AND ANAEROBIC Blood Culture results may not be optimal due to an inadequate volume of blood received in culture bottles Performed at [facility], [address]  [place], [place] [address]    Culture  Setup Time   Final    GRAM NEGATIVE RODS IN BOTH AEROBIC AND ANAEROBIC BOTTLES CRITICAL RESULT CALLED TO, READ BACK BY AND VERIFIED WITH: MATT MCBANE @ 0206 ON 05/03/2017 BY CAF Performed at [facility], [place] [address] [place], [place] [address]    Culture ESCHERICHIA COLI (A)  Final   Report Status 05/05/2017 FINAL  Final   Organism ID, Bacteria ESCHERICHIA COLI  Final      Susceptibility   Escherichia coli - MIC*    AMPICILLIN 16 INTERMEDIATE Intermediate     CEFAZOLIN <=4 SENSITIVE Sensitive     CEFEPIME <=1 SENSITIVE Sensitive     CEFTAZIDIME <=1 SENSITIVE Sensitive     CEFTRIAXONE <=1 SENSITIVE Sensitive     CIPROFLOXACIN <=0.25 SENSITIVE Sensitive     GENTAMICIN <=1 SENSITIVE Sensitive     IMIPENEM <=0.25 SENSITIVE Sensitive     TRIMETH/SULFA <=20 SENSITIVE Sensitive     AMPICILLIN/SULBACTAM 4 SENSITIVE Sensitive     PIP/TAZO <=4 SENSITIVE Sensitive     Extended ESBL NEGATIVE Sensitive     * ESCHERICHIA COLI  Culture, blood (routine x 2)     Status: Abnormal   Collection Time: 05/02/17  2:53 PM  Result Value Ref Range Status   Specimen Description   Final    BLOOD RIGHT ANTECUBITAL Performed at [facility], [address], [place], [place] [address]    Special Requests   Final    Blood Culture results may not be optimal due to an inadequate volume of blood received in culture bottles Performed at [facility], [address] [place], [place] [address]    Culture  Setup Time   Final    GRAM NEGATIVE RODS IN BOTH AEROBIC AND ANAEROBIC BOTTLES CRITICAL RESULT CALLED TO, READ BACK BY AND VERIFIED WITH: MATT MCBANE @ 0206 ON 05/03/2017 BY CAF Performed at [facility], [address] [place], [place] [address]    Culture (A)  Final    ESCHERICHIA COLI SUSCEPTIBILITIES PERFORMED ON PREVIOUS CULTURE WITHIN THE LAST 5 DAYS. Performed at [facility], [place] [address], [place], [place] [address]     Report Status 05/05/2017 FINAL  Final  C difficile quick scan w PCR reflex     Status: None   Collection Time: 05/02/17  2:53 PM  Result Value Ref Range Status   C Diff antigen NEGATIVE NEGATIVE Final   C Diff toxin NEGATIVE NEGATIVE Final   C Diff interpretation No C. difficile detected.  Final    Comment: Performed at [facility], [address] [place], [place]  [address]  Blood Culture ID Panel (Reflexed)     Status: Abnormal   Collection Time: 05/02/17  2:53 PM  Result Value Ref Range Status   Enterococcus species NOT DETECTED NOT DETECTED Final   Listeria monocytogenes NOT DETECTED NOT DETECTED Final   Staphylococcus species NOT DETECTED NOT DETECTED Final   Staphylococcus aureus NOT DETECTED NOT DETECTED Final   Streptococcus species NOT DETECTED NOT DETECTED Final   Streptococcus agalactiae NOT DETECTED NOT DETECTED Final   Streptococcus pneumoniae NOT DETECTED NOT DETECTED Final   Streptococcus pyogenes NOT DETECTED NOT DETECTED Final   Acinetobacter baumannii NOT DETECTED NOT DETECTED Final   Enterobacteriaceae species DETECTED (A) NOT DETECTED Final    Comment: Enterobacteriaceae represent a large family of gram-negative bacteria, not a single organism. CRITICAL RESULT CALLED TO, READ BACK BY AND VERIFIED WITH: MATT MCBANE @ 0206 ON 05/03/2017 BY CAF    Enterobacter cloacae complex NOT DETECTED NOT DETECTED Final   Escherichia coli DETECTED (A) NOT DETECTED Final    Comment: CRITICAL RESULT CALLED TO, READ BACK BY AND VERIFIED WITH: MATT MCBANE @ 0206 ON 05/03/2017 BY CAF    Klebsiella oxytoca NOT DETECTED NOT DETECTED Final   Klebsiella pneumoniae NOT DETECTED NOT DETECTED Final   Proteus species NOT DETECTED NOT DETECTED Final   Serratia marcescens NOT DETECTED NOT DETECTED Final   Carbapenem resistance NOT DETECTED NOT DETECTED Final   Haemophilus influenzae NOT DETECTED NOT DETECTED Final   Neisseria meningitidis NOT DETECTED NOT DETECTED Final    Pseudomonas aeruginosa NOT DETECTED NOT DETECTED Final   Candida albicans NOT DETECTED NOT DETECTED Final   Candida glabrata NOT DETECTED NOT DETECTED Final   Candida krusei NOT DETECTED NOT DETECTED Final   Candida parapsilosis NOT DETECTED NOT DETECTED Final   Candida tropicalis NOT DETECTED NOT DETECTED Final    Comment: Performed at [facility], [address] [place], [place] [address]  Urine Culture     Status: Abnormal   Collection Time: 05/03/17  9:16 AM  Result Value Ref Range Status   Specimen Description   Final    URINE, RANDOM Performed at [facility], [address], [place], [place] [address]    Special Requests   Final    NONE Performed at [facility], [address], [place], [place] [address]    Culture (A)  Final    <10,000 COLONIES/mL INSIGNIFICANT GROWTH Performed at [facility], [place] [address], [place], [place] [address]    Report Status 05/04/2017 FINAL  Final  Body fluid culture     Status: None (Preliminary result)   Collection Time: 05/03/17 11:30 AM  Result Value Ref Range Status   Specimen Description   Final    PERITONEAL Performed at [facility], [address], [place], [place] [address]    Special Requests   Final    NONE Performed at [facility], [place], [place] [address]    Gram Stain   Final    RARE WBC PRESENT,BOTH PMN AND MONONUCLEAR NO ORGANISMS SEEN    Culture   Final    NO GROWTH 2 DAYS Performed at [facility], [place] [address] [place], [place] [address]    Report Status PENDING  Incomplete     Studies: No results found.  Scheduled Meds: . allopurinol  300 mg Oral Daily  . cholecalciferol  1,000 Units Oral Daily  . docusate sodium  100 mg Oral BID  . ferrous  sulfate  325 mg Oral Q breakfast  . folic acid  1 mg Oral Daily  . levothyroxine  75 mcg Oral QAC breakfast  . LORazepam  0-4 mg Intravenous Q12H  . midodrine  5 mg Oral TID WC  .  mometasone-formoterol  2 puff Inhalation BID  . multivitamin with minerals  1 tablet Oral Daily  . pentoxifylline  400 mg Oral Daily  . potassium chloride  40 mEq Oral Q4H  . tamsulosin  0.4 mg Oral QPC supper  . thiamine  100 mg Oral Daily   Or  . thiamine  100 mg Intravenous Daily   Continuous Infusions: . albumin human    .  ceFAZolin (ANCEF) IV 1 g (05/05/17 1220)  . magnesium sulfate 1 - 4 g bolus IVPB Stopped (05/04/17 0150)    Assessment/Plan:  1. E. coli sepsis with multiorgan failure.  Source still suspected to be bacterial peritonitis, change antibiotic to Ancef 2. Hypotension and lactic acidosis.  Continue midodrine 3. Acute kidney injury secondary to hypotension and sepsis.  Improved renal function with midodrine 4. Alcoholic hepatitis with liver cirrhosis, jaundice, coagulopathy, ascites and thrombocytopenia and increased liver function test.   start prednisolone 40 mg daily. 5. Hypomagnesemia.  Replace 6. worsening thrombocytopenia secondary to sepsis and  liver cirrhosis.  .  TED hose 7. Hyponatremia secondary to alcohol abuse continue to monitor  Code Status:     Code Status Orders  (From admission, onward)        Start     Ordered   05/02/17 1836  Full code  Continuous     05/02/17 1838    Code Status History    Date Active Date Inactive Code Status Order ID Comments User Context   05/07/2013 1849 05/20/2013 1715 Full Code [id number]  Jacqulynn Cadet, MD Inpatient   05/06/2013 0109 05/07/2013 1849 Full Code [id number]  Germain Osgood, PA-C Inpatient    Advance Directive Documentation     Most Recent Value  Type of Advance Directive  Healthcare Power of Attorney  Pre-existing out of facility DNR order (yellow form or pink MOST form)  -  "MOST" Form in Place?  -     Family Communication: Wife at the bedside Disposition Plan: To be determined  Consultants:  Gastroenterology  Nephrology  Antibiotics:  Meropenem  Time spent: 28 minutes  Admir Candelas  [facility]

## 2017-05-05 NOTE — Progress Notes (Signed)
[facility], [place] 05/05/17  Subjective:   Patient is doing fair today Urine output appears to have improved to 1300 cc.  Foley catheter is in place Potassium is slightly low at 3.3 Serum creatinine has improved down to 2.13 today Patient states he is able to eat some about nausea or vomiting   Objective:  Vital signs in last 24 hours:  Temp:  [97.7 F (36.5 C)-97.8 F (36.6 C)] 97.8 F (36.6 C) (05/05 0509) Pulse Rate:  [51-59] 51 (05/05 0509) Resp:  [18] 18 (05/05 0509) BP: (96-100)/(55-73) 100/73 (05/05 0509) SpO2:  [97 %-98 %] 97 % (05/05 0509) Weight:  [209 lb 7 oz (95 kg)] 209 lb 7 oz (95 kg) (05/05 0500)  Weight change: 1 lb 8.6 oz (0.697 kg) Filed Weights   05/03/17 0310 05/04/17 0445 05/05/17 0500  Weight: 208 lb 12.4 oz (94.7 kg) 207 lb 14.4 oz (94.3 kg) 209 lb 7 oz (95 kg)    Intake/Output:    Intake/Output Summary (Last 24 hours) at 05/05/2017 1327 Last data filed at 05/05/2017 1325 Gross per 24 hour  Intake 800 ml  Output 650 ml  Net 150 ml     Physical Exam: General:  Chronically ill-appearing, laying in the bed  HEENT  moist oral mucous membranes  Neck  supple  Pulm/lungs  normal breathing effort, clear to auscultation  CVS/Heart  irregular rhythm, soft systolic murmur  Abdomen:   Distended, ascites  Extremities:  2-3+ pitting edema  Neurologic:  Alert, able to answer questions  Skin:  Warm    Foley in place       Basic Metabolic Panel:  Recent Labs  Lab 05/02/17 1452 05/03/17 0545 05/04/17 0550 05/05/17 0527  NA 131* 130* 128* 129*  K 3.6 3.7 3.3* 3.3*  CL 99* 98* 97* 97*  CO2 21* 22 22 22   GLUCOSE 129* 110* 97 122*  BUN 15 21* 33* 44*  CREATININE 1.61* 2.17* 2.54* 2.13*  CALCIUM 8.4* 7.9* 7.6* 8.3*  MG  --  1.0*  --  2.2  PHOS  --  3.8  --  3.3     CBC: Recent Labs  Lab 05/02/17 1452 05/03/17 0545 05/04/17 0550 05/05/17 0527  WBC 12.1* 17.3* 11.2* 8.4  NEUTROABS 10.1*  --   --   --   HGB  12.7* 12.1* 11.5* 11.6*  HCT 37.1* 34.9* 33.1* 32.6*  MCV 117.9* 118.2* 116.8* 117.4*  PLT 92* 76* 70* 75*      Lab Results  Component Value Date   HEPBSAG NEGATIVE 10/14/2009   HEPBIGM  10/14/2009    NEGATIVE (NOTE) High levels of Hepatitis B Core IgM antibody are detectable during the acute stage of Hepatitis B. This antibody is used to differentiate current from past HBV infection.       Microbiology:  Recent Results (from the past 240 hour(s))  Culture, blood (routine x 2)     Status: Abnormal   Collection Time: 05/02/17  2:53 PM  Result Value Ref Range Status   Specimen Description   Final    BLOOD BLOOD LEFT HAND Performed at [facility], [address] [place], [place] [address]    Special Requests   Final    BOTTLES DRAWN AEROBIC AND ANAEROBIC Blood Culture results may not be optimal due to an inadequate volume of blood received in culture bottles Performed at [facility], [address], [place], [place] [address]    Culture  Setup Time  Final    GRAM NEGATIVE RODS IN BOTH AEROBIC AND ANAEROBIC BOTTLES CRITICAL RESULT CALLED TO, READ BACK BY AND VERIFIED WITH: MATT MCBANE @ 0206 ON 05/03/2017 BY CAF Performed at [facility], [place] [address] [place] [address], [place], [place] [address]    Culture ESCHERICHIA COLI (A)  Final   Report Status 05/05/2017 FINAL  Final   Organism ID, Bacteria ESCHERICHIA COLI  Final      Susceptibility   Escherichia coli - MIC*    AMPICILLIN 16 INTERMEDIATE Intermediate     CEFAZOLIN <=4 SENSITIVE Sensitive     CEFEPIME <=1 SENSITIVE Sensitive     CEFTAZIDIME <=1 SENSITIVE Sensitive     CEFTRIAXONE <=1 SENSITIVE Sensitive     CIPROFLOXACIN <=0.25 SENSITIVE Sensitive     GENTAMICIN <=1 SENSITIVE Sensitive     IMIPENEM <=0.25 SENSITIVE Sensitive     TRIMETH/SULFA <=20 SENSITIVE Sensitive     AMPICILLIN/SULBACTAM 4 SENSITIVE Sensitive     PIP/TAZO <=4 SENSITIVE Sensitive     Extended ESBL NEGATIVE Sensitive     *  ESCHERICHIA COLI  Culture, blood (routine x 2)     Status: Abnormal   Collection Time: 05/02/17  2:53 PM  Result Value Ref Range Status   Specimen Description   Final    BLOOD RIGHT ANTECUBITAL Performed at [facility], [address], [place], [place] [address]    Special Requests   Final    Blood Culture results may not be optimal due to an inadequate volume of blood received in culture bottles Performed at [facility], [address] [place], [place] [address]    Culture  Setup Time   Final    GRAM NEGATIVE RODS IN BOTH AEROBIC AND ANAEROBIC BOTTLES CRITICAL RESULT CALLED TO, READ BACK BY AND VERIFIED WITH: MATT MCBANE @ 0206 ON 05/03/2017 BY CAF Performed at [facility], [address] [place], [place] [address]    Culture (A)  Final    ESCHERICHIA COLI SUSCEPTIBILITIES PERFORMED ON PREVIOUS CULTURE WITHIN THE LAST 5 DAYS. Performed at [facility], [place] [address], [place], [place] [address]    Report Status 05/05/2017 FINAL  Final  C difficile quick scan w PCR reflex     Status: None   Collection Time: 05/02/17  2:53 PM  Result Value Ref Range Status   C Diff antigen NEGATIVE NEGATIVE Final   C Diff toxin NEGATIVE NEGATIVE Final   C Diff interpretation No C. difficile detected.  Final    Comment: Performed at [facility], [address] [place], [place] [address]  Blood Culture ID Panel (Reflexed)     Status: Abnormal   Collection Time: 05/02/17  2:53 PM  Result Value Ref Range Status   Enterococcus species NOT DETECTED NOT DETECTED Final   Listeria monocytogenes NOT DETECTED NOT DETECTED Final   Staphylococcus species NOT DETECTED NOT DETECTED Final   Staphylococcus aureus NOT DETECTED NOT DETECTED Final   Streptococcus species NOT DETECTED NOT DETECTED Final   Streptococcus agalactiae NOT DETECTED NOT DETECTED Final   Streptococcus pneumoniae NOT DETECTED NOT DETECTED Final   Streptococcus pyogenes NOT DETECTED NOT  DETECTED Final   Acinetobacter baumannii NOT DETECTED NOT DETECTED Final   Enterobacteriaceae species DETECTED (A) NOT DETECTED Final    Comment: Enterobacteriaceae represent a large family of gram-negative bacteria, not a single organism. CRITICAL RESULT CALLED TO, READ BACK BY AND VERIFIED WITH: MATT MCBANE @ 0206 ON 05/03/2017 BY CAF    Enterobacter cloacae  complex NOT DETECTED NOT DETECTED Final   Escherichia coli DETECTED (A) NOT DETECTED Final    Comment: CRITICAL RESULT CALLED TO, READ BACK BY AND VERIFIED WITH: MATT MCBANE @ 0206 ON 05/03/2017 BY CAF    Klebsiella oxytoca NOT DETECTED NOT DETECTED Final   Klebsiella pneumoniae NOT DETECTED NOT DETECTED Final   Proteus species NOT DETECTED NOT DETECTED Final   Serratia marcescens NOT DETECTED NOT DETECTED Final   Carbapenem resistance NOT DETECTED NOT DETECTED Final   Haemophilus influenzae NOT DETECTED NOT DETECTED Final   Neisseria meningitidis NOT DETECTED NOT DETECTED Final   Pseudomonas aeruginosa NOT DETECTED NOT DETECTED Final   Candida albicans NOT DETECTED NOT DETECTED Final   Candida glabrata NOT DETECTED NOT DETECTED Final   Candida krusei NOT DETECTED NOT DETECTED Final   Candida parapsilosis NOT DETECTED NOT DETECTED Final   Candida tropicalis NOT DETECTED NOT DETECTED Final    Comment: Performed at [facility], [address] [place], [place] [address]  Urine Culture     Status: Abnormal   Collection Time: 05/03/17  9:16 AM  Result Value Ref Range Status   Specimen Description   Final    URINE, RANDOM Performed at [facility], [address] [place], [place] [address]    Special Requests   Final    NONE Performed at [facility], [address] [place], [place] [address]    Culture (A)  Final    <10,000 COLONIES/mL INSIGNIFICANT GROWTH Performed at [facility] [address] [place], [place] [address]    Report Status 05/04/2017 FINAL  Final  Body fluid culture      Status: None (Preliminary result)   Collection Time: 05/03/17 11:30 AM  Result Value Ref Range Status   Specimen Description   Final    PERITONEAL Performed at [facility], [address], [place], [place] [address]    Special Requests   Final    NONE Performed at [facility], [place], [place] [address]    Gram Stain   Final    RARE WBC PRESENT,BOTH PMN AND MONONUCLEAR NO ORGANISMS SEEN    Culture   Final    NO GROWTH 2 DAYS Performed at [facility], [place] [address], [place],  [address]    Report Status PENDING  Incomplete    Coagulation Studies: Recent Labs    05/02/17 April 22, 2039  LABPROT 21.4*  INR 1.87    Urinalysis: Recent Labs    05/03/17 0915  COLORURINE AMBER*  LABSPEC 1.036*  PHURINE 5.0  GLUCOSEU NEGATIVE  HGBUR MODERATE*  BILIRUBINUR SMALL*  KETONESUR NEGATIVE  PROTEINUR NEGATIVE  NITRITE NEGATIVE  LEUKOCYTESUR MODERATE*      Imaging: No results found.   Medications:   . albumin human    .  ceFAZolin (ANCEF) IV Stopped (05/05/17 1250)  . magnesium sulfate 1 - 4 g bolus IVPB Stopped (05/04/17 0150)   . allopurinol  300 mg Oral Daily  . cholecalciferol  1,000 Units Oral Daily  . docusate sodium  100 mg Oral BID  . ferrous sulfate  325 mg Oral Q breakfast  . folic acid  1 mg Oral Daily  . levothyroxine  75 mcg Oral QAC breakfast  . LORazepam  0-4 mg Intravenous Q12H  . midodrine  5 mg Oral TID WC  . mometasone-formoterol  2 puff Inhalation BID  . multivitamin with minerals  1 tablet Oral Daily  . pentoxifylline  400  mg Oral Daily  . potassium chloride  40 mEq Oral Q4H  . [START ON 05/06/2017] predniSONE  40 mg Oral Q breakfast  . tamsulosin  0.4 mg Oral QPC supper  . thiamine  100 mg Oral Daily   Or  . thiamine  100 mg Intravenous Daily   acetaminophen **OR** acetaminophen, albuterol, bisacodyl, HYDROcodone-acetaminophen, iopamidol, LORazepam **OR** LORazepam, ondansetron **OR** ondansetron  (ZOFRAN) IV, traZODone  Assessment/ Plan:  83 y.o. African-American male with hypertension, history of myelodysplastic syndrome, cholelithiasis, cirrhosis of the liver, alcohol abuse, ascites, was admitted on 05/02/2017 with fevers.  Found to have severe ascites and worsening renal failure.  Underwent paracentesis on 5/3 with 2 L of fluid was removed.  Nephrology consult for acute renal failure  1.  Acute renal failure, likely multifactorial Patient had IV contrast exposure at admission for CT abdomen and pelvis.  This likely contributed to acute kidney injury.  Also possibility of ATN from ongoing infection/sepsis.    Unlikely hepatorenal syndrome 2.  E. Coli sepsis 3.  Urinary retention  4.  Cirrhosis of the liver/ascites  Plan: Continue to treat underlying infection/E. coli sepsis Foley catheter for urinary retention.  May attempt voiding trial prior to discharge.  Flomax started.  PSA pending Continue IV albumin supplementation for next 1 to 2 days.  It appears that it has resulted in improvement in urine output.  Continue Midrin Further recommendations as hospital course progresses dialysis is Dr. Candiss Norse     LOS: [place] 5/5/20191:27 PM  Magazine, Angelina  Note: This note was prepared with Dragon dictation. Any transcription errors are unintentional

## 2017-05-06 LAB — BODY FLUID CULTURE: CULTURE: NO GROWTH

## 2017-05-06 LAB — COMPREHENSIVE METABOLIC PANEL
ALBUMIN: 2.6 g/dL — AB (ref 3.5–5.0)
ALK PHOS: 159 U/L — AB (ref 38–126)
ALT: 22 U/L (ref 17–63)
ANION GAP: 8 (ref 5–15)
AST: 58 U/L — ABNORMAL HIGH (ref 15–41)
BILIRUBIN TOTAL: 4.5 mg/dL — AB (ref 0.3–1.2)
BUN: 43 mg/dL — ABNORMAL HIGH (ref 6–20)
CALCIUM: 8.8 mg/dL — AB (ref 8.9–10.3)
CO2: 24 mmol/L (ref 22–32)
CREATININE: 1.86 mg/dL — AB (ref 0.61–1.24)
Chloride: 101 mmol/L (ref 101–111)
GFR calc Af Amer: 37 mL/min — ABNORMAL LOW (ref >60)
GFR calc non Af Amer: 32 mL/min — ABNORMAL LOW (ref >60)
GLUCOSE: 88 mg/dL (ref 65–99)
Potassium: 3.9 mmol/L (ref 3.5–5.1)
Sodium: 133 mmol/L — ABNORMAL LOW (ref 135–145)
TOTAL PROTEIN: 6.3 g/dL — AB (ref 6.5–8.1)

## 2017-05-06 LAB — CBC
HEMATOCRIT: 32.2 % — AB (ref 40.0–52.0)
HEMOGLOBIN: 11.4 g/dL — AB (ref 13.0–18.0)
MCH: 41.6 pg — ABNORMAL HIGH (ref 26.0–34.0)
MCHC: 35.4 g/dL (ref 32.0–36.0)
MCV: 117.4 fL — ABNORMAL HIGH (ref 80.0–100.0)
Platelets: 66 10*3/uL — ABNORMAL LOW (ref 150–440)
RBC: 2.74 MIL/uL — ABNORMAL LOW (ref 4.40–5.90)
RDW: 16.6 % — ABNORMAL HIGH (ref 11.5–14.5)
WBC: 5.6 10*3/uL (ref 3.8–10.6)

## 2017-05-06 LAB — EBV AB TO VIRAL CAPSID AG PNL, IGG+IGM: EBV VCA IgG: >600 U/mL — ABNORMAL HIGH (ref 0.0–17.9)

## 2017-05-06 LAB — GLUCOSE, CAPILLARY: Glucose-Capillary: 79 mg/dL (ref 65–99)

## 2017-05-06 MED ORDER — ENSURE ENLIVE PO LIQD
237.0000 mL | Freq: Two times a day (BID) | ORAL | Status: DC
  Administered 2017-05-06 – 2017-05-07 (×3): 237 mL via ORAL

## 2017-05-06 MED ORDER — MIDODRINE HCL 5 MG PO TABS
10.0000 mg | ORAL_TABLET | Freq: Three times a day (TID) | ORAL | Status: DC
  Administered 2017-05-06 – 2017-05-07 (×3): 10 mg via ORAL
  Filled 2017-05-06 (×4): qty 2

## 2017-05-06 NOTE — Progress Notes (Signed)
Joseph Hawkins , MD [address] [place], [place], [place], [address] [address], [place], [place], [place], [address] Phone: [phone]  Fax: [phone]   Joseph Hawkins is being followed for alcoholic hepatitis  Subjective: No complaints    Objective: Vital signs in last 24 hours: Vitals:   05/06/17 0500 05/06/17 0527 05/06/17 0955 05/06/17 1400  BP:  91/62 93/60 99/66   Pulse:  (!) 56 61 66  Resp:  16 16 16   Temp:  97.9 F (36.6 C) 97.6 F (36.4 C) 98.6 F (37 C)  TempSrc:  Oral Oral Oral  SpO2:  98% 99% 92%  Weight: 205 lb 14.6 oz (93.4 kg)     Height:       Weight change: -3 lb 8.4 oz (-1.6 kg)  Intake/Output Summary (Last 24 hours) at 05/06/2017 1532 Last data filed at 05/06/2017 1345 Gross per 24 hour  Intake 290 ml  Output 775 ml  Net -485 ml     Exam: Heart:: Regular rate and rhythm, S1S2 present or without murmur or extra heart sounds Lungs: normal, clear to auscultation and clear to auscultation and percussion Abdomen: distended, soft non tender, fluid thrill +, BS+, no guarding or rigidity    Lab Results: @LABTEST2 @ Micro Results: Recent Results (from the past 240 hour(s))  Culture, blood (routine x 2)     Status: Abnormal   Collection Time: 05/02/17  2:53 PM  Result Value Ref Range Status   Specimen Description   Final    BLOOD BLOOD LEFT HAND Performed at [facility], [address] [place], [place] [address]    Special Requests   Final    BOTTLES DRAWN AEROBIC AND ANAEROBIC Blood Culture results may not be optimal due to an inadequate volume of blood received in culture bottles Performed at [facility], [address] [place], [place] [address]    Culture  Setup Time   Final    GRAM NEGATIVE RODS IN BOTH AEROBIC AND ANAEROBIC BOTTLES CRITICAL RESULT CALLED TO, READ BACK BY AND VERIFIED WITH: MATT MCBANE @ 0206 ON 05/03/2017 BY CAF Performed at [facility], [place] [address], [place], [place] [address]    Culture ESCHERICHIA COLI (A)  Final   Report Status 05/05/2017 FINAL  Final   Organism ID, Bacteria ESCHERICHIA COLI  Final      Susceptibility   Escherichia coli - MIC*    AMPICILLIN 16 INTERMEDIATE Intermediate     CEFAZOLIN <=4 SENSITIVE Sensitive     CEFEPIME <=1 SENSITIVE Sensitive     CEFTAZIDIME <=1 SENSITIVE Sensitive     CEFTRIAXONE <=1 SENSITIVE Sensitive     CIPROFLOXACIN <=0.25 SENSITIVE Sensitive     GENTAMICIN <=1 SENSITIVE Sensitive     IMIPENEM <=0.25 SENSITIVE Sensitive     TRIMETH/SULFA <=20 SENSITIVE Sensitive     AMPICILLIN/SULBACTAM 4 SENSITIVE Sensitive     PIP/TAZO <=4 SENSITIVE Sensitive     Extended ESBL NEGATIVE Sensitive     * ESCHERICHIA COLI  Culture, blood (routine x 2)     Status: Abnormal   Collection Time: 05/02/17  2:53 PM  Result Value Ref Range Status   Specimen Description   Final    BLOOD RIGHT ANTECUBITAL Performed at [facility], [address] [place], [place] [address]    Special Requests   Final    Blood Culture results may not be optimal due to an inadequate volume of blood received in culture bottles Performed at [facility], [address] [place], [place] [address]    Culture  Setup Time   Final    GRAM NEGATIVE RODS IN BOTH AEROBIC AND ANAEROBIC BOTTLES CRITICAL RESULT CALLED TO, READ BACK BY AND VERIFIED WITH: MATT MCBANE @ 0206 ON 05/03/2017 BY CAF Performed at [facility], [address] [place], [place] [address]    Culture (A)  Final    ESCHERICHIA COLI SUSCEPTIBILITIES PERFORMED ON PREVIOUS CULTURE WITHIN THE LAST 5 DAYS. Performed at [facility], [place] [address], [place], [place] [address]    Report Status 05/05/2017 FINAL  Final  C difficile quick scan w PCR reflex     Status: None   Collection Time: 05/02/17  2:53 PM  Result Value Ref Range Status   C Diff antigen NEGATIVE NEGATIVE Final   C Diff toxin NEGATIVE NEGATIVE Final   C Diff interpretation No C. difficile detected.   Final    Comment: Performed at [facility], [address] [place], [place] [address]  Blood Culture ID Panel (Reflexed)     Status: Abnormal   Collection Time: 05/02/17  2:53 PM  Result Value Ref Range Status   Enterococcus species NOT DETECTED NOT DETECTED Final   Listeria monocytogenes NOT DETECTED NOT DETECTED Final   Staphylococcus species NOT DETECTED NOT DETECTED Final   Staphylococcus aureus NOT DETECTED NOT DETECTED Final   Streptococcus species NOT DETECTED NOT DETECTED Final   Streptococcus agalactiae NOT DETECTED NOT DETECTED Final   Streptococcus pneumoniae NOT DETECTED NOT DETECTED Final   Streptococcus pyogenes NOT DETECTED NOT DETECTED Final   Acinetobacter baumannii NOT DETECTED NOT DETECTED Final   Enterobacteriaceae species DETECTED (A) NOT DETECTED Final    Comment: Enterobacteriaceae represent a large family of gram-negative bacteria, not a single organism. CRITICAL RESULT CALLED TO, READ BACK BY AND VERIFIED WITH: MATT MCBANE @ 0206 ON 05/03/2017 BY CAF    Enterobacter cloacae complex NOT DETECTED NOT DETECTED Final   Escherichia coli DETECTED (A) NOT DETECTED Final    Comment: CRITICAL RESULT CALLED TO, READ BACK BY AND VERIFIED WITH: MATT MCBANE @ 0206 ON 05/03/2017 BY CAF    Klebsiella oxytoca NOT DETECTED NOT DETECTED Final   Klebsiella pneumoniae NOT DETECTED NOT DETECTED Final   Proteus species NOT DETECTED NOT DETECTED Final   Serratia marcescens NOT DETECTED NOT DETECTED Final   Carbapenem resistance NOT DETECTED NOT DETECTED Final   Haemophilus influenzae NOT DETECTED NOT DETECTED Final   Neisseria meningitidis NOT DETECTED NOT DETECTED Final   Pseudomonas aeruginosa NOT DETECTED NOT DETECTED Final   Candida albicans NOT DETECTED NOT DETECTED Final   Candida glabrata NOT DETECTED NOT DETECTED Final   Candida krusei NOT DETECTED NOT DETECTED Final   Candida parapsilosis NOT DETECTED NOT DETECTED Final   Candida tropicalis NOT DETECTED NOT  DETECTED Final    Comment: Performed at [facility], [address] [place], [place] [address]  Urine Culture     Status: Abnormal   Collection Time: 05/03/17  9:16 AM  Result Value Ref Range Status   Specimen Description   Final    URINE, RANDOM Performed at [facility], [address], [place], [place] [address]    Special Requests   Final    NONE Performed at [facility], [address] [place],  [address]    Culture (A)  Final    <10,000 COLONIES/mL INSIGNIFICANT GROWTH Performed at [facility], [place] [address] [place], [place]  [address]    Report Status 05/04/2017 FINAL  Final  Body fluid culture     Status: None   Collection Time: 05/03/17 11:30 AM  Result Value Ref Range Status   Specimen Description   Final    PERITONEAL Performed at [facility], [address] [place], [place] [address]    Special Requests   Final    NONE Performed at [facility], [place], [place] [address]    Gram Stain   Final    RARE WBC PRESENT,BOTH PMN AND MONONUCLEAR NO ORGANISMS SEEN    Culture   Final    NO GROWTH 3 DAYS Performed at [facility], [place] [address] [place], [place] [address]    Report Status 05/06/2017 FINAL  Final   Studies/Results: No results found. Medications: I have reviewed the patient's current medications. Scheduled Meds: . allopurinol  300 mg Oral Daily  . cholecalciferol  1,000 Units Oral Daily  . docusate sodium  100 mg Oral BID  . feeding supplement (ENSURE ENLIVE)  237 mL Oral BID BM  . ferrous sulfate  325 mg Oral Q breakfast  . folic acid  1 mg Oral Daily  . levothyroxine  75 mcg Oral QAC breakfast  . LORazepam  0-4 mg Intravenous Q12H  . midodrine  10 mg Oral TID WC  . mometasone-formoterol  2 puff Inhalation BID  . multivitamin with minerals  1 tablet Oral Daily  . pentoxifylline  400 mg Oral Daily  . predniSONE  40 mg Oral Q breakfast  . tamsulosin  0.4  mg Oral QPC supper  . thiamine  100 mg Oral Daily   Or  . thiamine  100 mg Intravenous Daily   Continuous Infusions: . albumin human    .  ceFAZolin (ANCEF) IV Stopped (05/06/17 1029)  . magnesium sulfate 1 - 4 g bolus IVPB Stopped (05/04/17 0150)   PRN Meds:.acetaminophen **OR** acetaminophen, albuterol, bisacodyl, HYDROcodone-acetaminophen, iopamidol, ondansetron **OR** ondansetron (ZOFRAN) IV, traZODone   Assessment: Active Problems:   Sepsis (HCC)   [name] 83 y.o. male with E coli sepsis . Complicated with AKI.  Ascitic fluid culture no growth. Unclear why ascitic fluid was not checked for cell count to rule out SBP. AKI renal following and felt to be multi factorial. Creatinine improving. Tbilirubin improving   Plan:  1. Replace all low electrolytes such as magnesium  2. Continue trental , strongly suggest against use of prednisone for alcoholic hepatitis in the setting of sepsis as steroids are needed for 6 weeks and are associated with high risk or infection and death , in addition his hepatitis B status is not yet ruled out negative at this time.  3. No diuretics till AKI improves.IF he doesn't tolerate diuretics may need TIPS at a later point.   4. F/u  Hep A/B/C/HIV/EBV,CMV,HSV.  5. Therapeutic paracentesis to remove as much fluid from the abdomen as possible to help relieve discomfort. IF >5 litres taken out suggest 50 grams of 25% albumin     LOS: 4 days   Joseph Bellows, MD 05/06/2017, 3:32 PM

## 2017-05-06 NOTE — Progress Notes (Signed)
Patient ID: Joseph Hawkins, male   DOB: 13-Sep-1934, 83 y.o.   MRN: [id number]  [employer] PROGRESS NOTE  [name] PXT:[id number] DOB: [birth year] DOA: 05/02/2017 PCP: Baxter Hire, MD  HPI/Subjective: Patient feeling much better no further abdominal pain   Objective: Vitals:   05/06/17 0955 05/06/17 1400  BP: 93/60 99/66  Pulse: 61 66  Resp: 16 16  Temp: 97.6 F (36.4 C) 98.6 F (37 C)  SpO2: 99% 92%    Filed Weights   05/04/17 0445 05/05/17 0500 05/06/17 0500  Weight: 94.3 kg (207 lb 14.4 oz) 95 kg (209 lb 7 oz) 93.4 kg (205 lb 14.6 oz)    ROS: Review of Systems  Constitutional: Negative for chills and fever.  Eyes: Negative for blurred vision.  Respiratory: Negative for cough and shortness of breath.   Cardiovascular: Negative for chest pain.  Gastrointestinal: Negative for abdominal pain, constipation, diarrhea, nausea and vomiting.  Genitourinary: Negative for dysuria.  Musculoskeletal: Negative for joint pain.  Neurological: Negative for dizziness and headaches.   Exam: Physical Exam  Constitutional: He is oriented to person, place, and time.  HENT:  Nose: No mucosal edema.  Mouth/Throat: No oropharyngeal exudate or posterior oropharyngeal edema.  Eyes: Pupils are equal, round, and reactive to light. Conjunctivae, EOM and lids are normal.  Neck: No JVD present. Carotid bruit is not present. No edema present. No thyroid mass and no thyromegaly present.  Cardiovascular: S1 normal and S2 normal. Exam reveals no gallop.  No murmur heard. Pulses:      Dorsalis pedis pulses are 2+ on the right side, and 2+ on the left side.  Respiratory: No respiratory distress. He has decreased breath sounds in the right lower field and the left lower field. He has no wheezes. He has no rhonchi. He has no rales.  GI: Soft. Bowel sounds are normal. He exhibits distension. There is no tenderness.  Musculoskeletal:       Right ankle: He exhibits swelling.       Left ankle: He  exhibits swelling.  Lymphadenopathy:    He has no cervical adenopathy.  Neurological: He is alert and oriented to person, place, and time. No cranial nerve deficit.  Skin: Skin is warm. No rash noted. Nails show no clubbing.  Psychiatric: He has a normal mood and affect.      Data Reviewed: Basic Metabolic Panel: Recent Labs  Lab 05/02/17 1452 05/03/17 0545 05/04/17 0550 05/05/17 0527 05/06/17 0447  NA 131* 130* 128* 129* 133*  K 3.6 3.7 3.3* 3.3* 3.9  CL 99* 98* 97* 97* 101  CO2 21* 22 22 22 24   GLUCOSE 129* 110* 97 122* 88  BUN 15 21* 33* 44* 43*  CREATININE 1.61* 2.17* 2.54* 2.13* 1.86*  CALCIUM 8.4* 7.9* 7.6* 8.3* 8.8*  MG  --  1.0*  --  2.2  --   PHOS  --  3.8  --  3.3  --    Liver Function Tests: Recent Labs  Lab 05/02/17 1452 05/04/17 0550 05/05/17 0527 05/06/17 0447  AST 67* 51* 48* 58*  ALT 24 21 19 22   ALKPHOS 235* 162* 159* 159*  BILITOT 7.1* 5.7* 5.0* 4.5*  PROT 7.2 6.0* 6.6 6.3*  ALBUMIN 2.0* 1.6* 2.3* 2.6*   Recent Labs  Lab 05/02/17 1452  LIPASE 38   CBC: Recent Labs  Lab 05/02/17 1452 05/03/17 0545 05/04/17 0550 05/05/17 0527 05/06/17 0447  WBC 12.1* 17.3* 11.2* 8.4 5.6  NEUTROABS 10.1*  --   --   --   --  HGB 12.7* 12.1* 11.5* 11.6* 11.4*  HCT 37.1* 34.9* 33.1* 32.6* 32.2*  MCV 117.9* 118.2* 116.8* 117.4* 117.4*  PLT 92* 76* 70* 75* 66*   Cardiac Enzymes: Recent Labs  Lab 05/02/17 1452  TROPONINI <0.03    CBG: Recent Labs  Lab 05/03/17 0810 05/04/17 0750 05/05/17 0753 05/05/17 1159 05/06/17 0730  GLUCAP 106* 78 108* 138* 79    Recent Results (from the past 240 hour(s))  Culture, blood (routine x 2)     Status: Abnormal   Collection Time: 05/02/17  2:53 PM  Result Value Ref Range Status   Specimen Description   Final    BLOOD BLOOD LEFT HAND Performed at [facility], [address] [place], [place] [address]    Special Requests   Final    BOTTLES DRAWN AEROBIC AND ANAEROBIC Blood Culture results  may not be optimal due to an inadequate volume of blood received in culture bottles Performed at [facility], [address] [place], [place] [address]    Culture  Setup Time   Final    GRAM NEGATIVE RODS IN BOTH AEROBIC AND ANAEROBIC BOTTLES CRITICAL RESULT CALLED TO, READ BACK BY AND VERIFIED WITH: MATT MCBANE @ 0206 ON 05/03/2017 BY CAF Performed at [facility], [place] [address] [place], [place] [address]    Culture ESCHERICHIA COLI (A)  Final   Report Status 05/05/2017 FINAL  Final   Organism ID, Bacteria ESCHERICHIA COLI  Final      Susceptibility   Escherichia coli - MIC*    AMPICILLIN 16 INTERMEDIATE Intermediate     CEFAZOLIN <=4 SENSITIVE Sensitive     CEFEPIME <=1 SENSITIVE Sensitive     CEFTAZIDIME <=1 SENSITIVE Sensitive     CEFTRIAXONE <=1 SENSITIVE Sensitive     CIPROFLOXACIN <=0.25 SENSITIVE Sensitive     GENTAMICIN <=1 SENSITIVE Sensitive     IMIPENEM <=0.25 SENSITIVE Sensitive     TRIMETH/SULFA <=20 SENSITIVE Sensitive     AMPICILLIN/SULBACTAM 4 SENSITIVE Sensitive     PIP/TAZO <=4 SENSITIVE Sensitive     Extended ESBL NEGATIVE Sensitive     * ESCHERICHIA COLI  Culture, blood (routine x 2)     Status: Abnormal   Collection Time: 05/02/17  2:53 PM  Result Value Ref Range Status   Specimen Description   Final    BLOOD RIGHT ANTECUBITAL Performed at [facility], [address] [place], [place] [address]    Special Requests   Final    Blood Culture results may not be optimal due to an inadequate volume of blood received in culture bottles Performed at [facility], [address] [place], [place] [address]    Culture  Setup Time   Final    GRAM NEGATIVE RODS IN BOTH AEROBIC AND ANAEROBIC BOTTLES CRITICAL RESULT CALLED TO, READ BACK BY AND VERIFIED WITH: MATT MCBANE @ 0206 ON 05/03/2017 BY CAF Performed at [facility], [address] [place], [place] [address]    Culture (A)  Final    ESCHERICHIA  COLI SUSCEPTIBILITIES PERFORMED ON PREVIOUS CULTURE WITHIN THE LAST 5 DAYS. Performed at [facility], [place] [address], [place],  [address]    Report Status 05/05/2017 FINAL  Final  C difficile quick scan w PCR reflex     Status: None   Collection Time: 05/02/17  2:53 PM  Result Value Ref Range Status   C Diff antigen NEGATIVE NEGATIVE Final   C Diff toxin NEGATIVE NEGATIVE Final  C Diff interpretation No C. difficile detected.  Final    Comment: Performed at [facility], [address] [place], [place] [address]  Blood Culture ID Panel (Reflexed)     Status: Abnormal   Collection Time: 05/02/17  2:53 PM  Result Value Ref Range Status   Enterococcus species NOT DETECTED NOT DETECTED Final   Listeria monocytogenes NOT DETECTED NOT DETECTED Final   Staphylococcus species NOT DETECTED NOT DETECTED Final   Staphylococcus aureus NOT DETECTED NOT DETECTED Final   Streptococcus species NOT DETECTED NOT DETECTED Final   Streptococcus agalactiae NOT DETECTED NOT DETECTED Final   Streptococcus pneumoniae NOT DETECTED NOT DETECTED Final   Streptococcus pyogenes NOT DETECTED NOT DETECTED Final   Acinetobacter baumannii NOT DETECTED NOT DETECTED Final   Enterobacteriaceae species DETECTED (A) NOT DETECTED Final    Comment: Enterobacteriaceae represent a large family of gram-negative bacteria, not a single organism. CRITICAL RESULT CALLED TO, READ BACK BY AND VERIFIED WITH: MATT MCBANE @ 0206 ON 05/03/2017 BY CAF    Enterobacter cloacae complex NOT DETECTED NOT DETECTED Final   Escherichia coli DETECTED (A) NOT DETECTED Final    Comment: CRITICAL RESULT CALLED TO, READ BACK BY AND VERIFIED WITH: MATT MCBANE @ 0206 ON 05/03/2017 BY CAF    Klebsiella oxytoca NOT DETECTED NOT DETECTED Final   Klebsiella pneumoniae NOT DETECTED NOT DETECTED Final   Proteus species NOT DETECTED NOT DETECTED Final   Serratia marcescens NOT DETECTED NOT DETECTED Final   Carbapenem resistance NOT  DETECTED NOT DETECTED Final   Haemophilus influenzae NOT DETECTED NOT DETECTED Final   Neisseria meningitidis NOT DETECTED NOT DETECTED Final   Pseudomonas aeruginosa NOT DETECTED NOT DETECTED Final   Candida albicans NOT DETECTED NOT DETECTED Final   Candida glabrata NOT DETECTED NOT DETECTED Final   Candida krusei NOT DETECTED NOT DETECTED Final   Candida parapsilosis NOT DETECTED NOT DETECTED Final   Candida tropicalis NOT DETECTED NOT DETECTED Final    Comment: Performed at [facility], [address] [place], [place] [address]  Urine Culture     Status: Abnormal   Collection Time: 05/03/17  9:16 AM  Result Value Ref Range Status   Specimen Description   Final    URINE, RANDOM Performed at [facility], [address], [place], [place] [address]    Special Requests   Final    NONE Performed at [facility], [address] [place], [place] [address]    Culture (A)  Final    <10,000 COLONIES/mL INSIGNIFICANT GROWTH Performed at [facility], [place] [address] [place], [place] [address]    Report Status 05/04/2017 FINAL  Final  Body fluid culture     Status: None (Preliminary result)   Collection Time: 05/03/17 11:30 AM  Result Value Ref Range Status   Specimen Description   Final    PERITONEAL Performed at [facility], [address], [place], [place] [address]    Special Requests   Final    NONE Performed at [facility], [place], [place] [address]    Gram Stain   Final    RARE WBC PRESENT,BOTH PMN AND MONONUCLEAR NO ORGANISMS SEEN    Culture   Final    NO GROWTH 3 DAYS Performed at [facility], [place] [address], [place], [place] [address]    Report Status PENDING  Incomplete     Studies: No results found.  Scheduled Meds: . allopurinol  300 mg Oral Daily  . cholecalciferol  1,000 Units Oral Daily  . docusate sodium  100 mg Oral BID  . feeding supplement (ENSURE ENLIVE)  237 mL  Oral BID BM  . ferrous sulfate  325 mg Oral Q breakfast  . folic acid  1 mg Oral Daily  . levothyroxine  75 mcg Oral QAC breakfast  . LORazepam  0-4 mg Intravenous Q12H  . midodrine  5 mg Oral TID WC  . mometasone-formoterol  2 puff Inhalation BID  . multivitamin with minerals  1 tablet Oral Daily  . pentoxifylline  400 mg Oral Daily  . predniSONE  40 mg Oral Q breakfast  . tamsulosin  0.4 mg Oral QPC supper  . thiamine  100 mg Oral Daily   Or  . thiamine  100 mg Intravenous Daily   Continuous Infusions: . albumin human    .  ceFAZolin (ANCEF) IV Stopped (05/06/17 1029)  . magnesium sulfate 1 - 4 g bolus IVPB Stopped (05/04/17 0150)    Assessment/Plan:  1. E. coli sepsis with multiorgan failure.  Source still suspected to be bacterial peritonitis, continue Ancef, oral antibiotics on discharge 2. Hypotension and lactic acidosis.  Increase midodrine dose to 10 mg po tid 3. Acute kidney injury secondary to hypotension and sepsis.  Improved renal function with midodrine 4. Alcoholic hepatitis with liver cirrhosis, jaundice, coagulopathy, ascites and thrombocytopenia and increased liver function test.   Continue prednisolone 40 mg daily. 5. Hypomagnesemia.  Replace 6. worsening thrombocytopenia secondary to sepsis and  liver cirrhosis.  .  TED hose 7. Hyponatremia secondary to alcohol abuse continue to monitor  Code Status:     Code Status Orders  (From admission, onward)        Start     Ordered   05/02/17 1836  Full code  Continuous     05/02/17 1838    Code Status History    Date Active Date Inactive Code Status Order ID Comments User Context   05/07/2013 1849 05/20/2013 1715 Full Code [id number]  Jacqulynn Cadet, MD Inpatient   05/06/2013 0109 05/07/2013 1849 Full Code [id number]  Germain Osgood, PA-C Inpatient    Advance Directive Documentation     Most Recent Value  Type of Advance Directive  Healthcare Power of Attorney  Pre-existing out of facility DNR order (yellow  form or pink MOST form)  -  "MOST" Form in Place?  -     Family Communication: Wife at the bedside Disposition Plan: To be determined  Consultants:  Gastroenterology  Nephrology  Antibiotics:  Meropenem  Time spent: 28 minutes  Deondrea Markos [facility]

## 2017-05-06 NOTE — Progress Notes (Signed)
[place], [place] 05/06/17  Subjective:  Patient seen at bedside. Still has significant ascites. Receiving IV albumin this a.m.   Objective:  Vital signs in last 24 hours:  Temp:  [96.7 F (35.9 C)-97.9 F (36.6 C)] 97.6 F (36.4 C) (05/06 0955) Pulse Rate:  [50-88] 61 (05/06 0955) Resp:  [16-20] 16 (05/06 0955) BP: (91-98)/(60-62) 93/60 (05/06 0955) SpO2:  [98 %-100 %] 99 % (05/06 0955) Weight:  [93.4 kg (205 lb 14.6 oz)] 93.4 kg (205 lb 14.6 oz) (05/06 0500)  Weight change: -1.6 kg (-3 lb 8.4 oz) Filed Weights   05/04/17 0445 05/05/17 0500 05/06/17 0500  Weight: 94.3 kg (207 lb 14.4 oz) 95 kg (209 lb 7 oz) 93.4 kg (205 lb 14.6 oz)    Intake/Output:    Intake/Output Summary (Last 24 hours) at 05/06/2017 1333 Last data filed at 05/06/2017 1029 Gross per 24 hour  Intake 290 ml  Output 875 ml  Net -585 ml     Physical Exam: General:  Chronically ill-appearing, laying in the bed  HEENT  moist oral mucous membranes  Neck  supple  Pulm/lungs  normal breathing effort, clear to auscultation  CVS/Heart  irregular rhythm  Abdomen:   Distended, ascites  Extremities:  2-3+ pitting edema  Neurologic:  Awake, alert, follows commands  Skin:  Warm    Foley in place       Basic Metabolic Panel:  Recent Labs  Lab 05/02/17 1452 05/03/17 0545 05/04/17 0550 05/05/17 0527 05/06/17 0447  NA 131* 130* 128* 129* 133*  K 3.6 3.7 3.3* 3.3* 3.9  CL 99* 98* 97* 97* 101  CO2 21* 22 22 22 24   GLUCOSE 129* 110* 97 122* 88  BUN 15 21* 33* 44* 43*  CREATININE 1.61* 2.17* 2.54* 2.13* 1.86*  CALCIUM 8.4* 7.9* 7.6* 8.3* 8.8*  MG  --  1.0*  --  2.2  --   PHOS  --  3.8  --  3.3  --      CBC: Recent Labs  Lab 05/02/17 1452 05/03/17 0545 05/04/17 0550 05/05/17 0527 05/06/17 0447  WBC 12.1* 17.3* 11.2* 8.4 5.6  NEUTROABS 10.1*  --   --   --   --   HGB 12.7* 12.1* 11.5* 11.6* 11.4*  HCT 37.1* 34.9* 33.1* 32.6* 32.2*  MCV 117.9* 118.2* 116.8*  117.4* 117.4*  PLT 92* 76* 70* 75* 66*      Lab Results  Component Value Date   HEPBSAG NEGATIVE 10/14/2009   HEPBIGM  10/14/2009    NEGATIVE (NOTE) High levels of Hepatitis B Core IgM antibody are detectable during the acute stage of Hepatitis B. This antibody is used to differentiate current from past HBV infection.       Microbiology:  Recent Results (from the past 240 hour(s))  Culture, blood (routine x 2)     Status: Abnormal   Collection Time: 05/02/17  2:53 PM  Result Value Ref Range Status   Specimen Description   Final    BLOOD BLOOD LEFT HAND Performed at [facility], [address], [place], [place] [address]    Special Requests   Final    BOTTLES DRAWN AEROBIC AND ANAEROBIC Blood Culture results may not be optimal due to an inadequate volume of blood received in culture bottles Performed at [facility], [address] [place],  [address]    Culture  Setup Time   Final    GRAM NEGATIVE RODS IN BOTH AEROBIC AND ANAEROBIC  BOTTLES CRITICAL RESULT CALLED TO, READ BACK BY AND VERIFIED WITH: MATT MCBANE @ 0206 ON 05/03/2017 BY CAF Performed at [facility], [place] [address], [place], [place] [address]    Culture ESCHERICHIA COLI (A)  Final   Report Status 05/05/2017 FINAL  Final   Organism ID, Bacteria ESCHERICHIA COLI  Final      Susceptibility   Escherichia coli - MIC*    AMPICILLIN 16 INTERMEDIATE Intermediate     CEFAZOLIN <=4 SENSITIVE Sensitive     CEFEPIME <=1 SENSITIVE Sensitive     CEFTAZIDIME <=1 SENSITIVE Sensitive     CEFTRIAXONE <=1 SENSITIVE Sensitive     CIPROFLOXACIN <=0.25 SENSITIVE Sensitive     GENTAMICIN <=1 SENSITIVE Sensitive     IMIPENEM <=0.25 SENSITIVE Sensitive     TRIMETH/SULFA <=20 SENSITIVE Sensitive     AMPICILLIN/SULBACTAM 4 SENSITIVE Sensitive     PIP/TAZO <=4 SENSITIVE Sensitive     Extended ESBL NEGATIVE Sensitive     * ESCHERICHIA COLI  Culture, blood (routine x 2)     Status: Abnormal    Collection Time: 05/02/17  2:53 PM  Result Value Ref Range Status   Specimen Description   Final    BLOOD RIGHT ANTECUBITAL Performed at [facility], [address], [place], [place] [address]    Special Requests   Final    Blood Culture results may not be optimal due to an inadequate volume of blood received in culture bottles Performed at [facility], [address] [place], [place] [address]    Culture  Setup Time   Final    GRAM NEGATIVE RODS IN BOTH AEROBIC AND ANAEROBIC BOTTLES CRITICAL RESULT CALLED TO, READ BACK BY AND VERIFIED WITH: MATT MCBANE @ 0206 ON 05/03/2017 BY CAF Performed at [facility], [address] [place], [place] [address]    Culture (A)  Final    ESCHERICHIA COLI SUSCEPTIBILITIES PERFORMED ON PREVIOUS CULTURE WITHIN THE LAST 5 DAYS. Performed at [facility], [place] [address] [place], [place] [address]    Report Status 05/05/2017 FINAL  Final  C difficile quick scan w PCR reflex     Status: None   Collection Time: 05/02/17  2:53 PM  Result Value Ref Range Status   C Diff antigen NEGATIVE NEGATIVE Final   C Diff toxin NEGATIVE NEGATIVE Final   C Diff interpretation No C. difficile detected.  Final    Comment: Performed at [facility], [address] [place],  [address]  Blood Culture ID Panel (Reflexed)     Status: Abnormal   Collection Time: 05/02/17  2:53 PM  Result Value Ref Range Status   Enterococcus species NOT DETECTED NOT DETECTED Final   Listeria monocytogenes NOT DETECTED NOT DETECTED Final   Staphylococcus species NOT DETECTED NOT DETECTED Final   Staphylococcus aureus NOT DETECTED NOT DETECTED Final   Streptococcus species NOT DETECTED NOT DETECTED Final   Streptococcus agalactiae NOT DETECTED NOT DETECTED Final   Streptococcus pneumoniae NOT DETECTED NOT DETECTED Final   Streptococcus pyogenes NOT DETECTED NOT DETECTED Final   Acinetobacter baumannii NOT DETECTED NOT DETECTED Final    Enterobacteriaceae species DETECTED (A) NOT DETECTED Final    Comment: Enterobacteriaceae represent a large family of gram-negative bacteria, not a single organism. CRITICAL RESULT CALLED TO, READ BACK BY AND VERIFIED WITH: MATT MCBANE @ 0206 ON 05/03/2017 BY CAF    Enterobacter cloacae complex NOT DETECTED NOT DETECTED Final   Escherichia coli DETECTED (A)  NOT DETECTED Final    Comment: CRITICAL RESULT CALLED TO, READ BACK BY AND VERIFIED WITH: MATT MCBANE @ 0206 ON 05/03/2017 BY CAF    Klebsiella oxytoca NOT DETECTED NOT DETECTED Final   Klebsiella pneumoniae NOT DETECTED NOT DETECTED Final   Proteus species NOT DETECTED NOT DETECTED Final   Serratia marcescens NOT DETECTED NOT DETECTED Final   Carbapenem resistance NOT DETECTED NOT DETECTED Final   Haemophilus influenzae NOT DETECTED NOT DETECTED Final   Neisseria meningitidis NOT DETECTED NOT DETECTED Final   Pseudomonas aeruginosa NOT DETECTED NOT DETECTED Final   Candida albicans NOT DETECTED NOT DETECTED Final   Candida glabrata NOT DETECTED NOT DETECTED Final   Candida krusei NOT DETECTED NOT DETECTED Final   Candida parapsilosis NOT DETECTED NOT DETECTED Final   Candida tropicalis NOT DETECTED NOT DETECTED Final    Comment: Performed at [facility], [address], [place], [place] [address]  Urine Culture     Status: Abnormal   Collection Time: 05/03/17  9:16 AM  Result Value Ref Range Status   Specimen Description   Final    URINE, RANDOM Performed at [facility], [address], [place], [place] [address]    Special Requests   Final    NONE Performed at [facility], [address], [place], [place] [address]    Culture (A)  Final    <10,000 COLONIES/mL INSIGNIFICANT GROWTH Performed at [facility], [place] [address], [place], [place] [address]    Report Status 05/04/2017 FINAL  Final  Body fluid culture     Status: None (Preliminary result)   Collection Time: 05/03/17 11:30 AM   Result Value Ref Range Status   Specimen Description   Final    PERITONEAL Performed at [facility], [address] [place], [place] [address]    Special Requests   Final    NONE Performed at [facility], [place], [place] [address]    Gram Stain   Final    RARE WBC PRESENT,BOTH PMN AND MONONUCLEAR NO ORGANISMS SEEN    Culture   Final    NO GROWTH 3 DAYS Performed at [facility], [place] [address], [place], [place] [address]    Report Status PENDING  Incomplete    Coagulation Studies: No results for input(s): LABPROT, INR in the last 72 hours.  Urinalysis: No results for input(s): COLORURINE, LABSPEC, PHURINE, GLUCOSEU, HGBUR, BILIRUBINUR, KETONESUR, PROTEINUR, UROBILINOGEN, NITRITE, LEUKOCYTESUR in the last 72 hours.  Invalid input(s): APPERANCEUR    Imaging: No results found.   Medications:   . albumin human    .  ceFAZolin (ANCEF) IV Stopped (05/06/17 1029)  . magnesium sulfate 1 - 4 g bolus IVPB Stopped (05/04/17 0150)   . allopurinol  300 mg Oral Daily  . cholecalciferol  1,000 Units Oral Daily  . docusate sodium  100 mg Oral BID  . feeding supplement (ENSURE ENLIVE)  237 mL Oral BID BM  . ferrous sulfate  325 mg Oral Q breakfast  . folic acid  1 mg Oral Daily  . levothyroxine  75 mcg Oral QAC breakfast  . LORazepam  0-4 mg Intravenous Q12H  . midodrine  5 mg Oral TID WC  . mometasone-formoterol  2 puff Inhalation BID  . multivitamin with minerals  1 tablet Oral Daily  . pentoxifylline  400 mg Oral Daily  . predniSONE  40 mg Oral Q breakfast  . tamsulosin  0.4 mg Oral  QPC supper  . thiamine  100 mg Oral Daily   Or  . thiamine  100 mg Intravenous Daily   acetaminophen **OR** acetaminophen, albuterol, bisacodyl, HYDROcodone-acetaminophen, iopamidol, ondansetron **OR** ondansetron (ZOFRAN) IV, traZODone  Assessment/ Plan:  83 y.o. African-American male with hypertension, history of myelodysplastic syndrome,  cholelithiasis, cirrhosis of the liver, alcohol abuse, ascites, was admitted on 05/02/2017 with fevers.  Found to have severe ascites and worsening renal failure.  Underwent paracentesis on 5/3 with 2 L of fluid was removed.  Nephrology consult for acute renal failure  1.  Acute renal failure, likely multifactorial Patient had IV contrast exposure at admission for CT abdomen and pelvis.  This likely contributed to acute kidney injury.  Also possibility of ATN from ongoing infection/sepsis.    Unlikely hepatorenal syndrome 2.  E. Coli sepsis 3.  Urinary retention  4.  Cirrhosis of the liver/ascites  Plan: Creatinine is improving and currently down to 1.86.  Therefore we will maintain the patient on albumin infusion for now.  Continue treatment of E. coli sepsis as per hospitalist.  Foley catheter remains in place for treatment of urinary retention.  Patient has been started on Flomax 0.4 mg daily.  Further plan as patient progresses.     LOS: 4 Joseph Hawkins 5/6/20191:33 PM  [facility] [place], [place]  Note: This note was prepared with Dragon dictation. Any transcription errors are unintentional

## 2017-05-06 NOTE — Care Management Important Message (Signed)
Copy of signed IM left in patient's room.    

## 2017-05-06 NOTE — Evaluation (Signed)
Physical Therapy Evaluation Patient Details Name: Joseph Hawkins MRN: [id number] DOB: [birth year] Today's Date: 05/06/2017   History of Present Illness  Pt is an 83 y.o. male presenting to hospital with fever, non-productive cough, and diarrhea.  Pt admitted with E. Coli sepsis with multi-organ failure, liver cirrhosis d/t alcohol with portal htn, hypotension, lactic acidosis.  Pt s/p paracentesis 05/03/17.  PMH includes anemia, CKD, htn, a-fib with RVR, esophageal varices in cirrhosis.    Clinical Impression  Prior to hospital admission, pt was independent with ambulation.  Pt lives with his wife in 1 level home with 4-5 steps to enter with R railing.  Currently pt is modified independent with bed mobility; CGA with transfers using RW; and CGA ambulating 110 feet x2 with RW (pt unable to take any steps without UE support d/t LE weakness and unsteadiness but did well with use of RW).  Pt would benefit from skilled PT to address noted impairments and functional limitations (see below for any additional details).  Upon hospital discharge, recommend pt discharge to home with HHPT and support of family.    Follow Up Recommendations Home health PT    Equipment Recommendations  Rolling walker with 5" wheels    Recommendations for Other Services       Precautions / Restrictions Precautions Precautions: Fall Restrictions Weight Bearing Restrictions: No      Mobility  Bed Mobility Overal bed mobility: Modified Independent             General bed mobility comments: Supine to/from sit with HOB elevated without any noted difficulties.  Transfers Overall transfer level: Needs assistance Equipment used: None;Rolling walker (2 wheeled) Transfers: Sit to/from Stand Sit to Stand: Min assist;Min guard         General transfer comment: min assist to stand without AD (assist to steady) and CGA standing with RW (initial vc's for UE placement required and then pt able to perform on  own)  Ambulation/Gait Ambulation/Gait assistance: Min guard Ambulation Distance (Feet): (110 feet x2) Assistive device: Rolling walker (2 wheeled)   Gait velocity: decreased   General Gait Details: pt unable to takes steps d/t unsteadiness and weakness without UE support but did much better with use of RW; mild SOB and fatigue noted with distance ambulated  Stairs            Wheelchair Mobility    Modified Rankin (Stroke Patients Only)       Balance Overall balance assessment: Needs assistance Sitting-balance support: No upper extremity supported;Feet supported Sitting balance-Leahy Scale: Normal Sitting balance - Comments: steady sitting reaching outside BOS   Standing balance support: Single extremity supported Standing balance-Leahy Scale: Poor Standing balance comment: requires at least single UE support for static standing balance                             Pertinent Vitals/Pain Pain Assessment: No/denies pain  Vitals (HR and O2 on room air) stable and WFL throughout treatment session.    Home Living Family/patient expects to be discharged to:: Private residence Living Arrangements: Spouse/significant other Available Help at Discharge: Family Type of Home: House Home Access: Stairs to enter Entrance Stairs-Rails: Right Entrance Stairs-Number of Steps: 4-5 Home Layout: One level [place] - single point      Prior Function Level of Independence: Independent         Comments: Pt ambulating without AD.  H/o 2 falls d/t getting up too quick  and passing out.     Hand Dominance        Extremity/Trunk Assessment   Upper Extremity Assessment Upper Extremity Assessment: Generalized weakness    Lower Extremity Assessment Lower Extremity Assessment: Generalized weakness    Cervical / Trunk Assessment Cervical / Trunk Assessment: Normal  Communication   Communication: (Difficult to understand at times)  Cognition  Arousal/Alertness: Awake/alert Behavior During Therapy: WFL for tasks assessed/performed Overall Cognitive Status: Within Functional Limits for tasks assessed                                        General Comments General comments (skin integrity, edema, etc.): Pt resting in bed upon PT entry; pt's wife and daughter present.  Nursing cleared pt for participation in physical therapy.  Pt agreeable to PT session.    Exercises  Ambulation; gait and transfer training with RW   Assessment/Plan    PT Assessment Patient needs continued PT services  PT Problem List Decreased strength;Decreased activity tolerance;Decreased balance;Decreased mobility;Decreased knowledge of use of DME;Decreased knowledge of precautions       PT Treatment Interventions DME instruction;Gait training;Stair training;Functional mobility training;Therapeutic activities;Therapeutic exercise;Balance training;Patient/family education    PT Goals (Current goals can be found in the Care Plan section)  Acute Rehab PT Goals Patient Stated Goal: to go home soon PT Goal Formulation: With patient Time For Goal Achievement: 05/20/17 Potential to Achieve Goals: Good    Frequency Min 2X/week   Barriers to discharge        Co-evaluation               AM-PAC PT "6 Clicks" Daily Activity  Outcome Measure Difficulty turning over in bed (including adjusting bedclothes, sheets and blankets)?: A Little Difficulty moving from lying on back to sitting on the side of the bed? : A Little Difficulty sitting down on and standing up from a chair with arms (e.g., wheelchair, bedside commode, etc,.)?: Unable Help needed moving to and from a bed to chair (including a wheelchair)?: A Little Help needed walking in hospital room?: A Little Help needed climbing 3-5 steps with a railing? : A Little 6 Click Score: 16    End of Session Equipment Utilized During Treatment: Gait belt Activity Tolerance: Patient  tolerated treatment well Patient left: in bed;with call bell/phone within reach;with family/visitor present Nurse Communication: Mobility status;Precautions(Bed alarm not on upon PT entry and not placed end of session; nursing notified and ok with bed alarm off and reported she would check on pt) PT Visit Diagnosis: Other abnormalities of gait and mobility (R26.89);Unsteadiness on feet (R26.81);Muscle weakness (generalized) (M62.81);History of falling (Z91.81)    Time: 1751-0258 PT Time Calculation (min) (ACUTE ONLY): 20 min   Charges:   PT Evaluation $PT Eval Low Complexity: 1 Low PT Treatments $Therapeutic Exercise: 8-22 mins   PT G CodesLeitha Bleak, PT 05/06/17, 2:39 PM [phone]

## 2017-05-07 LAB — BASIC METABOLIC PANEL
ANION GAP: 7 (ref 5–15)
BUN: 38 mg/dL — AB (ref 6–20)
CO2: 23 mmol/L (ref 22–32)
Calcium: 9.1 mg/dL (ref 8.9–10.3)
Chloride: 100 mmol/L — ABNORMAL LOW (ref 101–111)
Creatinine, Ser: 1.29 mg/dL — ABNORMAL HIGH (ref 0.61–1.24)
GFR calc Af Amer: 58 mL/min — ABNORMAL LOW (ref >60)
GFR calc non Af Amer: 50 mL/min — ABNORMAL LOW (ref >60)
GLUCOSE: 148 mg/dL — AB (ref 65–99)
POTASSIUM: 4.1 mmol/L (ref 3.5–5.1)
Sodium: 130 mmol/L — ABNORMAL LOW (ref 135–145)

## 2017-05-07 LAB — CBC
HEMATOCRIT: 33.3 % — AB (ref 40.0–52.0)
HEMOGLOBIN: 11.7 g/dL — AB (ref 13.0–18.0)
MCH: 41.2 pg — AB (ref 26.0–34.0)
MCHC: 35.2 g/dL (ref 32.0–36.0)
MCV: 117.1 fL — ABNORMAL HIGH (ref 80.0–100.0)
Platelets: 61 10*3/uL — ABNORMAL LOW (ref 150–440)
RBC: 2.84 MIL/uL — ABNORMAL LOW (ref 4.40–5.90)
RDW: 16.4 % — AB (ref 11.5–14.5)
WBC: 3.2 10*3/uL — ABNORMAL LOW (ref 3.8–10.6)

## 2017-05-07 LAB — GLUCOSE, CAPILLARY: Glucose-Capillary: 122 mg/dL — ABNORMAL HIGH (ref 65–99)

## 2017-05-07 LAB — HEPATITIS B SURFACE ANTIGEN: Hepatitis B Surface Ag: NEGATIVE

## 2017-05-07 LAB — HERPES SIMPLEX VIRUS(HSV) DNA BY PCR
HSV 1 DNA: NEGATIVE
HSV 2 DNA: NEGATIVE

## 2017-05-07 MED ORDER — MIDODRINE HCL 10 MG PO TABS: 10.0000 mg | ORAL_TABLET | Freq: Three times a day (TID) | ORAL | 2 refills | Status: AC

## 2017-05-07 MED ORDER — CEFUROXIME AXETIL 500 MG PO TABS: 500.0000 mg | ORAL_TABLET | Freq: Two times a day (BID) | ORAL | 0 refills | Status: AC

## 2017-05-07 MED ORDER — PENTOXIFYLLINE ER 400 MG PO TBCR: 400.0000 mg | EXTENDED_RELEASE_TABLET | Freq: Every day | ORAL | 0 refills | Status: DC

## 2017-05-07 NOTE — Progress Notes (Signed)
Nutrition Follow Up Note   DOCUMENTATION CODES:   Not applicable  INTERVENTION:   Ensure Enlive po TID, each supplement provides 350 kcal and 20 grams of protein  MVI daily  Magic cup TID with meals, each supplement provides 290 kcal and 9 grams of protein  Thiamine 100mg  po daily  Folic acid 1mg  po daily   NUTRITION DIAGNOSIS:   Increased nutrient needs related to chronic illness(ETOH abuse, cirrhosis with ascites ) as evidenced by increased estimated needs.  GOAL:   Patient will meet greater than or equal to 90% of their needs  -progressing   MONITOR:   PO intake, Supplement acceptance, Labs, Weight trends, Skin, I & O's  ASSESSMENT:   83 y.o. African-American  male with hypertension, history of myelodysplastic syndrome, cholelithiasis, cirrhosis of the liver, alcohol abuse, ascites, was admitted on 05/02/2017 with fevers.  Found to have severe ascites and worsening renal failure.  Underwent paracentesis on 5/3 with 2 L of fluid was removed.    Pt with improved appetite and oral intake; pt eating 100% of meals and drinking some Ensure. On CIWA. Per chart, pt is weight stable since admit. Ascites and edema present. Continue supplements and vitamins.   Medications reviewed and include: allopurinol, vitamin D, colace, ferrous sulfate, folic acid, synthroid, MVI, thiamine, prednisone, cefazolin   Labs reviewed: Na 130(L), K 4.1 wnl, Cl 100(L), BUN 38(H), creat 1.29(H) P 3.3 wnl, Mg 2.2 wnl- 5/5 tbili- 4.5(H)- 5/6 Wbc- 3.2(L), Hgb 11.7(L), Hct 33.3(L), MCV 117.1(H), MCH 41.2(H)  Diet Order:   Diet Order           Diet 2 gram sodium Room service appropriate? Yes; Fluid consistency: Thin  Diet effective now         EDUCATION NEEDS:   No education needs have been identified at this time  Skin:  Skin Assessment: Reviewed RN Assessment  Last BM:  5/6 - type 7  Height:   Ht Readings from Last 1 Encounters:  05/02/17 6' (1.829 m)    Weight:   Wt Readings from  Last 1 Encounters:  05/07/17 209 lb 14.1 oz (95.2 kg)    Ideal Body Weight:  80.9 kg  BMI:  Body mass index is 28.46 kg/m.  Estimated Nutritional Needs:   Kcal:  2200-2500kcal/day   Protein:  113-123g/day   Fluid:  per MD  Koleen Distance MS, RD, LDN Pager #[phone] After Hours Pager: [phone]

## 2017-05-07 NOTE — Progress Notes (Signed)
Pt was given D/C instructions along with teaching him foley care and how to switch foley bags. His vs were WDL and he was given follow up appts. I went over all his information and they had no questions. He was released to his wife and daughter.

## 2017-05-07 NOTE — Care Management Note (Signed)
Case Management Note  Patient Details  Name: NORWIN ALEMAN MRN: [id number] Date of Birth: [birth year]    Referral made to [facility] with [facility]  Patient and family states he has a RW and cane in the home  Subjective/Objective:                    Action/Plan:   Expected Discharge Date:  05/07/17               Expected Discharge Plan:  [place]  In-House Referral:     Discharge planning Services  CM Consult  Post Acute Care Choice:  Home Health Choice offered to:  Patient, Adult Children  DME Arranged:    DME Agency:     HH Arranged:  RN, PT, Nurse's Aide [place] Agency:  [place]  Status of Service:  Completed, signed off  If discussed at [place] of Stay Meetings, dates discussed:    Additional Comments:  Beverly Sessions, RN 05/07/2017, 3:31 PM

## 2017-05-07 NOTE — Discharge Summary (Signed)
[place] at [facility], [place], 83 y.o., DOB 01/04/1934, MRN [id number]. Admission date: 05/02/2017 Discharge Date 05/07/2017 Primary MD Joseph Hire, MD Admitting Physician Joseph Lesches, MD  Admission Diagnosis  AKI (acute kidney injury) ([place]) [N17.9] Elevated alkaline phosphatase level [R74.8] Ascites of liver [R18.8]  Discharge Diagnosis   Active Problems: E. coli sepsis with multiorgan failure source due to bacterial peritonitis Hypotension and lactic acidosis due to liver cirrhosis Acute kidney injury secondary to hypotension sepsis renal function now resolved Urinary retention patient will have Foley in place will need outpatient urology follow-up Alcoholic hepatitis with liver cirrhosis and jaundice and coagulopathy Hypomagnesemia Thrombocytopenia Hyponatremia Alcohol abuse      Hospital Course  Joseph Hawkins  is a 83 y.o. male with a known history of diabetes, liver cirrhosis came in for fever, nonproductive cough for 2 days /also has diarrhea patient went 4 times today mainly watery stool.  Patient was noted to have sepsis related to E. coli.  Source was felt to be due to ascites.  Patient underwent a paracentesis cultures were negative however suspicion for bacteremia was felt to be due to his abdomen.  Patient has been treated with antibiotics.  He also had elevated LFTs and findings consistent with acute alcoholic hepatitis.  He was seen in consultation by GI who recommended pentoxifylline therapy.  Patient is strongly recommended not to drink. He also was noted to have acute renal failure and was seen by nephrology.  He was noted to have urinary retention.  A voiding trial was attempted in the hospital however he continues to have difficulty with urination therefore a Foley is continued.  He will need outpatient follow-up with urology for this. Patient also was noted to have hypotension related to his current illness he is started on  Midrodrin, this will need to be weaned off by his primary care provider.  At that point he could be started on a low-dose Lasix and nadolol if his blood pressure allows.                 Consults gastroenterology, nephrology Significant Tests:  See full reports for all details    Dg Chest 2 View  Result Date: 05/02/2017 CLINICAL DATA:  Fever, cough, diarrhea for 2 days EXAM: CHEST - 2 VIEW COMPARISON:  Portable chest x-ray of 05/16/2013 FINDINGS: No active infiltrate or effusion is seen. The lungs are not optimally aerated. Mediastinal and hilar contours are unremarkable. The heart is mildly enlarged. No acute bony abnormality is seen. IMPRESSION: No active lung disease.  Borderline to mild cardiomegaly. Electronically Signed   By: Joseph Hawkins M.D.   On: 05/02/2017 15:21   Ct Abdomen Pelvis W Contrast  Result Date: 05/02/2017 CLINICAL DATA:  82 year old male with fever, chills and diarrhea EXAM: CT ABDOMEN AND PELVIS WITH CONTRAST TECHNIQUE: Multidetector CT imaging of the abdomen and pelvis was performed using the standard protocol following bolus administration of intravenous contrast. CONTRAST:  85mL OMNIPAQUE IOHEXOL 300 MG/ML  SOLN COMPARISON:  Prior CT scan of the abdomen and pelvis 05/09/2013 FINDINGS: Lower chest: The lung bases are clear. Visualized cardiac structures are within normal limits for size. No pericardial effusion. Unremarkable visualized distal thoracic esophagus. Hepatobiliary: The liver is relatively small and demonstrates a diffusely nodular contour. There is relative hypertrophy of the left and caudate lobes and atrophy of the right hepatic lobe. No enhancing mass. The portal veins remain patent. Stones are present within the gallbladder. No gallbladder dilatation  or wall thickening. No biliary ductal dilatation. Pancreas: Unremarkable. No pancreatic ductal dilatation or surrounding inflammatory changes. Spleen: Borderline splenomegaly. Adrenals/Urinary Tract:  Unremarkable adrenal glands. No enhancing renal mass, hydronephrosis or nephrolithiasis. Ureters are unremarkable. Bladder is only partially distended but unremarkable for degree of distention. Stomach/Bowel: No evidence of obstruction or focal bowel wall thickening. Vascular/Lymphatic: Atherosclerotic calcifications present throughout the abdominal aorta. No evidence of aneurysm. No evidence of a recurrent gastric varices. Metallic coils are present in the remnant of a left gastro renal shunt. The visceral veins are patent. Reproductive: Prostate is unremarkable. Other: Large volume ascites. Musculoskeletal: No acute fracture or aggressive appearing lytic or blastic osseous lesion. IMPRESSION: 1. Cirrhosis with portal hypertension and large volume ascites. 2. 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 No evidence of recurrent or residual gastric varices. 3. Cholelithiasis. 4.  Aortic Atherosclerosis (ICD10-170.0). Electronically Signed   By: Joseph Hawkins M.D.   On: 05/02/2017 16:31   US Paracentesis  Result Date: 05/03/2017 INDICATION: 83 year old with cirrhosis and ascites. Request for a diagnostic paracentesis. Patient is hypotensive. EXAM: ULTRASOUND GUIDED DIAGNOSTIC PARACENTESIS MEDICATIONS: None. COMPLICATIONS: None immediate. PROCEDURE: Informed written consent was obtained from the patient after a discussion of the risks, benefits and alternatives to treatment. A timeout was performed prior to the initiation of the procedure. Initial ultrasound scanning demonstrates a large amount of ascites within the right lower abdominal quadrant. The right lower abdomen was prepped and draped in the usual sterile fashion. 1% lidocaine was used for local anesthesia. Following this, a 6 Fr Safe-T-Centesis catheter was introduced. An ultrasound image was saved for documentation purposes. The paracentesis was performed. Paracentesis was stopped after 2 L due  to patient's hypotension. Blood pressure did not significantly change with the paracentesis. The catheter was removed and a dressing was applied. The patient tolerated the procedure well without immediate post procedural complication. FINDINGS: A total of approximately 2 L of yellow fluid was removed. Samples were sent to the laboratory as requested by the clinical team. IMPRESSION: Successful ultrasound-guided diagnostic paracentesis yielding 2 liters of peritoneal fluid. Electronically Signed   By: Markus Daft M.D.   On: 05/03/2017 12:12   US Abdomen Limited Ruq  Result Date: 05/02/2017 CLINICAL DATA:  Elevated alkaline phosphatase. Bilirubin 7.1. Abdominal distention. History of cirrhosis. EXAM: ULTRASOUND ABDOMEN LIMITED RIGHT UPPER QUADRANT COMPARISON:  CT of the abdomen and pelvis on 05/02/2017 FINDINGS: Gallbladder: Gallbladder is filled with stones and contracted around the stones. Largest stone measures approximately 11 millimeters. Gallbladder wall is thickened, 5.4 millimeters. No sonographic Murphy sign identified. Common bile duct: Diameter: 3.5 millimeters Liver: No focal lesion identified. Within normal limits in parenchymal echogenicity. Marked nodular contour the liver consistent with cirrhosis. No focal liver lesions are identified. Additional: Moderate volume ascites. IMPRESSION: 1. Cirrhotic changes in the liver. 2. Ascites. 3. Cholelithiasis and thickened gallbladder wall. No other evidence for acute cholecystitis. Electronically Signed   By: [name] M.D.   On: 05/02/2017 17:46       Today   Subjective:   Joseph Hawkins patient feeling much better wants to go home  Objective:   Blood pressure 115/72, pulse 63, temperature 97.8 F (36.6 C), temperature source Oral, resp. rate 19, height 6' (1.829 m), weight 95.2 kg (209 lb 14.1 oz), SpO2 99 %.  .  Intake/Output Summary (Last 24 hours) at 05/07/2017 1501 Last data filed at 05/07/2017 1300 Gross per 24 hour  Intake 1230 ml   Output 1050 ml  Net 180 ml    Exam VITAL SIGNS: Blood pressure 115/72, pulse 63, temperature 97.8 F (36.6 C), temperature source Oral, resp. rate 19, height 6' (1.829 m), weight 95.2 kg (209 lb 14.1 oz), SpO2 99 %.  GENERAL:  83 y.o.-year-old patient lying in the bed with no acute distress.  EYES: Pupils equal, round, reactive to light and accommodation. No scleral icterus. Extraocular muscles intact.  HEENT: Head atraumatic, normocephalic. Oropharynx and nasopharynx clear.  NECK:  Supple, no jugular venous distention. No thyroid enlargement, no tenderness.  LUNGS: Normal breath sounds bilaterally, no wheezing, rales,rhonchi or crepitation. No use of accessory muscles of respiration.  CARDIOVASCULAR: S1, S2 normal. No murmurs, rubs, or gallops.  ABDOMEN: Soft, nontender, nondistended. Bowel sounds present. No organomegaly or mass.  EXTREMITIES: No pedal edema, cyanosis, or clubbing.  NEUROLOGIC: Cranial nerves II through XII are intact. Muscle strength 5/5 in all extremities. Sensation intact. Gait not checked.  PSYCHIATRIC: The patient is alert and oriented x 3.  SKIN: No obvious rash, lesion, or ulcer.   Data Review     CBC w Diff:  Lab Results  Component Value Date   WBC 3.2 (L) 05/07/2017   HGB 11.7 (L) 05/07/2017   HGB 12.2 (L) 08/03/2013   HCT 33.3 (L) 05/07/2017   HCT 37.3 (L) 08/03/2013   PLT 61 (L) 05/07/2017   PLT 107 (L) 08/03/2013   LYMPHOPCT 5 05/02/2017   LYMPHOPCT 29.8 08/03/2013   MONOPCT 11 05/02/2017   MONOPCT 14.7 08/03/2013   EOSPCT 0 05/02/2017   EOSPCT 5.9 08/03/2013   BASOPCT 0 05/02/2017   BASOPCT 1.4 08/03/2013   CMP:  Lab Results  Component Value Date   NA 130 (L) 05/07/2017   NA 141 05/05/2013   K 4.1 05/07/2017   K 4.2 05/05/2013   CL 100 (L) 05/07/2017   CL 109 (H) 05/05/2013   CO2 23 05/07/2017   CO2 23 05/05/2013   BUN 38 (H) 05/07/2017   BUN 30 (H) 05/05/2013   CREATININE 1.29 (H) 05/07/2017   CREATININE 1.18 05/05/2013    PROT 6.3 (L) 05/06/2017   PROT 8.1 05/04/2013   ALBUMIN 2.6 (L) 05/06/2017   ALBUMIN 2.8 (L) 05/04/2013   BILITOT 4.5 (H) 05/06/2017   BILITOT 2.0 (H) 05/04/2013   ALKPHOS 159 (H) 05/06/2017   ALKPHOS 347 (H) 05/04/2013   AST 58 (H) 05/06/2017   AST 92 (H) 05/04/2013   ALT 22 05/06/2017   ALT 36 05/04/2013  .  Micro Results Recent Results (from the past 240 hour(s))  Culture, blood (routine x 2)     Status: Abnormal   Collection Time: 05/02/17  2:53 PM  Result Value Ref Range Status   Specimen Description   Final    BLOOD BLOOD LEFT HAND Performed at [facility], [address] [place], [place] [address]    Special Requests   Final    BOTTLES DRAWN AEROBIC AND ANAEROBIC Blood Culture results may not be optimal due to an inadequate volume of blood received in culture bottles Performed at [facility], [address], [place], [place] [address]    Culture  Setup Time   Final    GRAM NEGATIVE RODS IN BOTH AEROBIC AND ANAEROBIC BOTTLES CRITICAL RESULT CALLED TO, READ BACK BY AND VERIFIED WITH: MATT MCBANE @ 0206 ON 05/03/2017 BY CAF Performed at [facility], [place] [address], [place], [place] [address]    Culture ESCHERICHIA COLI (A)  Final   Report Status 05/05/2017  FINAL  Final   Organism ID, Bacteria ESCHERICHIA COLI  Final      Susceptibility   Escherichia coli - MIC*    AMPICILLIN 16 INTERMEDIATE Intermediate     CEFAZOLIN <=4 SENSITIVE Sensitive     CEFEPIME <=1 SENSITIVE Sensitive     CEFTAZIDIME <=1 SENSITIVE Sensitive     CEFTRIAXONE <=1 SENSITIVE Sensitive     CIPROFLOXACIN <=0.25 SENSITIVE Sensitive     GENTAMICIN <=1 SENSITIVE Sensitive     IMIPENEM <=0.25 SENSITIVE Sensitive     TRIMETH/SULFA <=20 SENSITIVE Sensitive     AMPICILLIN/SULBACTAM 4 SENSITIVE Sensitive     PIP/TAZO <=4 SENSITIVE Sensitive     Extended ESBL NEGATIVE Sensitive     * ESCHERICHIA COLI  Culture, blood (routine x 2)     Status: Abnormal   Collection Time:  05/02/17  2:53 PM  Result Value Ref Range Status   Specimen Description   Final    BLOOD RIGHT ANTECUBITAL Performed at [facility], [address], [place], [place] [address]    Special Requests   Final    Blood Culture results may not be optimal due to an inadequate volume of blood received in culture bottles Performed at [facility], [address] [place], [place] [address]    Culture  Setup Time   Final    GRAM NEGATIVE RODS IN BOTH AEROBIC AND ANAEROBIC BOTTLES CRITICAL RESULT CALLED TO, READ BACK BY AND VERIFIED WITH: MATT MCBANE @ 0206 ON 05/03/2017 BY CAF Performed at [facility], [address] [place], [place] [address]    Culture (A)  Final    ESCHERICHIA COLI SUSCEPTIBILITIES PERFORMED ON PREVIOUS CULTURE WITHIN THE LAST 5 DAYS. Performed at [facility], [place] [address] [place], [place] [address]    Report Status 05/05/2017 FINAL  Final  C difficile quick scan w PCR reflex     Status: None   Collection Time: 05/02/17  2:53 PM  Result Value Ref Range Status   C Diff antigen NEGATIVE NEGATIVE Final   C Diff toxin NEGATIVE NEGATIVE Final   C Diff interpretation No C. difficile detected.  Final    Comment: Performed at [facility], [address] [place], [place] [address]  Blood Culture ID Panel (Reflexed)     Status: Abnormal   Collection Time: 05/02/17  2:53 PM  Result Value Ref Range Status   Enterococcus species NOT DETECTED NOT DETECTED Final   Listeria monocytogenes NOT DETECTED NOT DETECTED Final   Staphylococcus species NOT DETECTED NOT DETECTED Final   Staphylococcus aureus NOT DETECTED NOT DETECTED Final   Streptococcus species NOT DETECTED NOT DETECTED Final   Streptococcus agalactiae NOT DETECTED NOT DETECTED Final   Streptococcus pneumoniae NOT DETECTED NOT DETECTED Final   Streptococcus pyogenes NOT DETECTED NOT DETECTED Final   Acinetobacter baumannii NOT DETECTED NOT DETECTED Final    Enterobacteriaceae species DETECTED (A) NOT DETECTED Final    Comment: Enterobacteriaceae represent a large family of gram-negative bacteria, not a single organism. CRITICAL RESULT CALLED TO, READ BACK BY AND VERIFIED WITH: MATT MCBANE @ 0206 ON 05/03/2017 BY CAF    Enterobacter cloacae complex NOT DETECTED NOT DETECTED Final   Escherichia coli DETECTED (A) NOT DETECTED Final    Comment: CRITICAL RESULT CALLED TO, READ BACK BY AND VERIFIED WITH: MATT MCBANE @ 0206 ON 05/03/2017 BY CAF    Klebsiella oxytoca NOT DETECTED NOT DETECTED Final   Klebsiella pneumoniae NOT DETECTED NOT DETECTED Final  Proteus species NOT DETECTED NOT DETECTED Final   Serratia marcescens NOT DETECTED NOT DETECTED Final   Carbapenem resistance NOT DETECTED NOT DETECTED Final   Haemophilus influenzae NOT DETECTED NOT DETECTED Final   Neisseria meningitidis NOT DETECTED NOT DETECTED Final   Pseudomonas aeruginosa NOT DETECTED NOT DETECTED Final   Candida albicans NOT DETECTED NOT DETECTED Final   Candida glabrata NOT DETECTED NOT DETECTED Final   Candida krusei NOT DETECTED NOT DETECTED Final   Candida parapsilosis NOT DETECTED NOT DETECTED Final   Candida tropicalis NOT DETECTED NOT DETECTED Final    Comment: Performed at [facility], [address] [place], [place] [address]  Urine Culture     Status: Abnormal   Collection Time: 05/03/17  9:16 AM  Result Value Ref Range Status   Specimen Description   Final    URINE, RANDOM Performed at [facility], [address] [place], [place] [address]    Special Requests   Final    NONE Performed at [facility], [place], [place] [address]    Culture (A)  Final    <10,000 COLONIES/mL INSIGNIFICANT GROWTH Performed at [facility] [address], [place], [place] [address]    Report Status 05/04/2017 FINAL  Final  Body fluid culture     Status: None   Collection Time: 05/03/17 11:30 AM  Result Value Ref Range  Status   Specimen Description   Final    PERITONEAL Performed at [facility], [address] [place], [place] [address]    Special Requests   Final    NONE Performed at [facility], [place], [place] [address]    Gram Stain   Final    RARE WBC PRESENT,BOTH PMN AND MONONUCLEAR NO ORGANISMS SEEN    Culture   Final    NO GROWTH 3 DAYS Performed at [facility], [place] [address], [place], [place] [address]    Report Status 05/06/2017 FINAL  Final        Code Status Orders  (From admission, onward)        Start     Ordered   05/02/17 1836  Full code  Continuous     05/02/17 1838    Code Status History    Date Active Date Inactive Code Status Order ID Comments User Context   05/07/2013 1849 05/20/2013 1715 Full Code [id number]  Joseph Cadet, MD Inpatient   05/06/2013 0109 05/07/2013 1849 Full Code [id number]  Germain Osgood, PA-C Inpatient    Advance Directive Documentation     Most Recent Value  Type of Advance Directive  Healthcare Power of Attorney  Pre-existing out of facility DNR order (yellow form or pink MOST form)  -  "MOST" Form in Place?  -          Follow-up Information    Joseph Hire, MD. Go on 05/13/2017.   Specialty:  Internal Medicine Why:  Monday at 3:45pm for hospital follow-up Contact information: [place] [address] [phone]        Jonathon Bellows, MD. Go on 06/03/2017.   Specialty:  Gastroenterology Why:  Monday at 1:30pm for hospital follow-up Contact information: [place] [address] [phone]        Hollice Espy, MD. Go on 05/14/2017.   Specialty:  Urology Why:  Tuesday at 8:30am for urianary rention, has foley in place/hospital follow-up Contact information: [address] [place] [address] [phone]           Discharge Medications   Allergies as of 05/07/2017   No Known Allergies      Medication List    STOP taking these medications   furosemide 40 MG tablet Commonly known as:  LASIX   nadolol 40 MG tablet Commonly known as:  CORGARD   spironolactone 50 MG tablet Commonly known as:  ALDACTONE     TAKE these medications   albuterol (2.5 MG/3ML) 0.083% nebulizer solution Commonly known as:  PROVENTIL Take 3 mLs (2.5 mg total) by nebulization every 6 (six) hours as needed for wheezing or shortness of breath.   allopurinol 300 MG tablet Commonly known as:  ZYLOPRIM Take 300 mg by mouth daily.   cefUROXime 500 MG tablet Commonly known as:  CEFTIN Take 1 tablet (500 mg total) by mouth 2 (two) times daily for 10 days.   cholecalciferol 1000 units tablet Commonly known as:  VITAMIN D Take 1,000 Units by mouth daily.   FERROUSUL 325 (65 FE) MG tablet Generic drug:  ferrous sulfate Take 325 mg by mouth daily with breakfast.   folic acid 1 MG tablet Commonly known as:  FOLVITE Take 1 tablet (1 mg total) by mouth daily.   levothyroxine 75 MCG tablet Commonly known as:  SYNTHROID, LEVOTHROID Take 75 mcg by mouth daily before breakfast.   midodrine 10 MG tablet Commonly known as:  PROAMATINE Take 1 tablet (10 mg total) by mouth 3 (three) times daily with meals.   mometasone-formoterol 100-5 MCG/ACT Aero Commonly known as:  DULERA Inhale 2 puffs into the lungs 2 (two) times daily.   pantoprazole 40 MG tablet Commonly known as:  PROTONIX Take 1 tablet (40 mg total) by mouth daily. What changed:  when to take this   pentoxifylline 400 MG CR tablet Commonly known as:  TRENTAL Take 1 tablet (400 mg total) by mouth daily. Start taking on:  05/08/2017   tamsulosin 0.4 MG Caps capsule Commonly known as:  FLOMAX Take 1 capsule (0.4 mg total) by mouth daily.   thiamine 100 MG tablet Take 1 tablet (100 mg total) by mouth daily.          Total Time in preparing paper work, data evaluation and todays exam - 26 minutes  Dustin Flock M.D on 05/07/2017  at [place]  [phone]

## 2017-05-07 NOTE — Progress Notes (Signed)
[facility], [place] 05/07/17  Subjective:  Renal function has significantly improved.   creatinine down to 1.29 at the moment. Still has distended abdomen however.   Objective:  Vital signs in last 24 hours:  Temp:  [97.6 F (36.4 C)-97.8 F (36.6 C)] 97.8 F (36.6 C) (05/07 1305) Pulse Rate:  [54-63] 63 (05/07 1305) Resp:  [18-20] 19 (05/07 1305) BP: (99-115)/(64-72) 115/72 (05/07 1305) SpO2:  [96 %-99 %] 99 % (05/07 1305) Weight:  [95.2 kg (209 lb 14.1 oz)] 95.2 kg (209 lb 14.1 oz) (05/07 0500)  Weight change: 1.8 kg (3 lb 15.5 oz) Filed Weights   05/05/17 0500 05/06/17 0500 05/07/17 0500  Weight: 95 kg (209 lb 7 oz) 93.4 kg (205 lb 14.6 oz) 95.2 kg (209 lb 14.1 oz)    Intake/Output:    Intake/Output Summary (Last 24 hours) at 05/07/2017 1455 Last data filed at 05/07/2017 1300 Gross per 24 hour  Intake 1230 ml  Output 1050 ml  Net 180 ml     Physical Exam: General:  Chronically ill-appearing, sitting up  HEENT  moist oral mucous membranes  Neck  supple  Pulm/lungs  normal breathing effort, clear to auscultation  CVS/Heart  irregular rhythm  Abdomen:   Distended, ascites  Extremities:  2-3+ pitting edema  Neurologic:  Awake, alert, follows commands  Skin:  Warm    Foley in place       Basic Metabolic Panel:  Recent Labs  Lab 05/03/17 0545 05/04/17 0550 05/05/17 0527 05/06/17 0447 05/07/17 0452  NA 130* 128* 129* 133* 130*  K 3.7 3.3* 3.3* 3.9 4.1  CL 98* 97* 97* 101 100*  CO2 22 22 22 24 23   GLUCOSE 110* 97 122* 88 148*  BUN 21* 33* 44* 43* 38*  CREATININE 2.17* 2.54* 2.13* 1.86* 1.29*  CALCIUM 7.9* 7.6* 8.3* 8.8* 9.1  MG 1.0*  --  2.2  --   --   PHOS 3.8  --  3.3  --   --      CBC: Recent Labs  Lab 05/02/17 1452 05/03/17 0545 05/04/17 0550 05/05/17 0527 05/06/17 0447 05/07/17 0452  WBC 12.1* 17.3* 11.2* 8.4 5.6 3.2*  NEUTROABS 10.1*  --   --   --   --   --   HGB 12.7* 12.1* 11.5* 11.6* 11.4* 11.7*  HCT  37.1* 34.9* 33.1* 32.6* 32.2* 33.3*  MCV 117.9* 118.2* 116.8* 117.4* 117.4* 117.1*  PLT 92* 76* 70* 75* 66* 61*      Lab Results  Component Value Date   HEPBSAG Negative 05/04/2017   HEPBIGM  10/14/2009    NEGATIVE (NOTE) High levels of Hepatitis B Core IgM antibody are detectable during the acute stage of Hepatitis B. This antibody is used to differentiate current from past HBV infection.       Microbiology:  Recent Results (from the past 240 hour(s))  Culture, blood (routine x 2)     Status: Abnormal   Collection Time: 05/02/17  2:53 PM  Result Value Ref Range Status   Specimen Description   Final    BLOOD BLOOD LEFT HAND Performed at [facility], [address], [place], [place] [address]    Special Requests   Final    BOTTLES DRAWN AEROBIC AND ANAEROBIC Blood Culture results may not be optimal due to an inadequate volume of blood received in culture bottles Performed at [facility], [address] [place], [place] [address]    Culture  Setup Time  Final    GRAM NEGATIVE RODS IN BOTH AEROBIC AND ANAEROBIC BOTTLES CRITICAL RESULT CALLED TO, READ BACK BY AND VERIFIED WITH: MATT MCBANE @ 0206 ON 05/03/2017 BY CAF Performed at [facility], [place] [address] [place], [place] [address]    Culture ESCHERICHIA COLI (A)  Final   Report Status 05/05/2017 FINAL  Final   Organism ID, Bacteria ESCHERICHIA COLI  Final      Susceptibility   Escherichia coli - MIC*    AMPICILLIN 16 INTERMEDIATE Intermediate     CEFAZOLIN <=4 SENSITIVE Sensitive     CEFEPIME <=1 SENSITIVE Sensitive     CEFTAZIDIME <=1 SENSITIVE Sensitive     CEFTRIAXONE <=1 SENSITIVE Sensitive     CIPROFLOXACIN <=0.25 SENSITIVE Sensitive     GENTAMICIN <=1 SENSITIVE Sensitive     IMIPENEM <=0.25 SENSITIVE Sensitive     TRIMETH/SULFA <=20 SENSITIVE Sensitive     AMPICILLIN/SULBACTAM 4 SENSITIVE Sensitive     PIP/TAZO <=4 SENSITIVE Sensitive     Extended ESBL NEGATIVE Sensitive     *  ESCHERICHIA COLI  Culture, blood (routine x 2)     Status: Abnormal   Collection Time: 05/02/17  2:53 PM  Result Value Ref Range Status   Specimen Description   Final    BLOOD RIGHT ANTECUBITAL Performed at [facility], [address], [place], [place] [address]    Special Requests   Final    Blood Culture results may not be optimal due to an inadequate volume of blood received in culture bottles Performed at [facility], [address] [place], [place] [address]    Culture  Setup Time   Final    GRAM NEGATIVE RODS IN BOTH AEROBIC AND ANAEROBIC BOTTLES CRITICAL RESULT CALLED TO, READ BACK BY AND VERIFIED WITH: MATT MCBANE @ 0206 ON 05/03/2017 BY CAF Performed at [facility], [address] [place], [place] [address]    Culture (A)  Final    ESCHERICHIA COLI SUSCEPTIBILITIES PERFORMED ON PREVIOUS CULTURE WITHIN THE LAST 5 DAYS. Performed at [facility], [place] [address] [place], [place] [address]    Report Status 05/05/2017 FINAL  Final  C difficile quick scan w PCR reflex     Status: None   Collection Time: 05/02/17  2:53 PM  Result Value Ref Range Status   C Diff antigen NEGATIVE NEGATIVE Final   C Diff toxin NEGATIVE NEGATIVE Final   C Diff interpretation No C. difficile detected.  Final    Comment: Performed at [facility], [address] [place],  [address]  Blood Culture ID Panel (Reflexed)     Status: Abnormal   Collection Time: 05/02/17  2:53 PM  Result Value Ref Range Status   Enterococcus species NOT DETECTED NOT DETECTED Final   Listeria monocytogenes NOT DETECTED NOT DETECTED Final   Staphylococcus species NOT DETECTED NOT DETECTED Final   Staphylococcus aureus NOT DETECTED NOT DETECTED Final   Streptococcus species NOT DETECTED NOT DETECTED Final   Streptococcus agalactiae NOT DETECTED NOT DETECTED Final   Streptococcus pneumoniae NOT DETECTED NOT DETECTED Final   Streptococcus pyogenes NOT DETECTED NOT  DETECTED Final   Acinetobacter baumannii NOT DETECTED NOT DETECTED Final   Enterobacteriaceae species DETECTED (A) NOT DETECTED Final    Comment: Enterobacteriaceae represent a large family of gram-negative bacteria, not a single organism. CRITICAL RESULT CALLED TO, READ BACK BY AND VERIFIED WITH: MATT MCBANE @ 0206 ON 05/03/2017 BY CAF    Enterobacter cloacae  complex NOT DETECTED NOT DETECTED Final   Escherichia coli DETECTED (A) NOT DETECTED Final    Comment: CRITICAL RESULT CALLED TO, READ BACK BY AND VERIFIED WITH: MATT MCBANE @ 0206 ON 05/03/2017 BY CAF    Klebsiella oxytoca NOT DETECTED NOT DETECTED Final   Klebsiella pneumoniae NOT DETECTED NOT DETECTED Final   Proteus species NOT DETECTED NOT DETECTED Final   Serratia marcescens NOT DETECTED NOT DETECTED Final   Carbapenem resistance NOT DETECTED NOT DETECTED Final   Haemophilus influenzae NOT DETECTED NOT DETECTED Final   Neisseria meningitidis NOT DETECTED NOT DETECTED Final   Pseudomonas aeruginosa NOT DETECTED NOT DETECTED Final   Candida albicans NOT DETECTED NOT DETECTED Final   Candida glabrata NOT DETECTED NOT DETECTED Final   Candida krusei NOT DETECTED NOT DETECTED Final   Candida parapsilosis NOT DETECTED NOT DETECTED Final   Candida tropicalis NOT DETECTED NOT DETECTED Final    Comment: Performed at [facility], [address] [place], [place] [address]  Urine Culture     Status: Abnormal   Collection Time: 05/03/17  9:16 AM  Result Value Ref Range Status   Specimen Description   Final    URINE, RANDOM Performed at [facility], [address], [place], [place] [address]    Special Requests   Final    NONE Performed at [facility], [address] [place], [place] [address]    Culture (A)  Final    <10,000 COLONIES/mL INSIGNIFICANT GROWTH Performed at [place] [address], [place], [place] [address]    Report Status 05/04/2017 FINAL  Final  Body fluid culture      Status: None   Collection Time: 05/03/17 11:30 AM  Result Value Ref Range Status   Specimen Description   Final    PERITONEAL Performed at [facility], [address] [place], [place] [address]    Special Requests   Final    NONE Performed at [facility], [place], [place] [address]    Gram Stain   Final    RARE WBC PRESENT,BOTH PMN AND MONONUCLEAR NO ORGANISMS SEEN    Culture   Final    NO GROWTH 3 DAYS Performed at [facility], [place] [address] [place], [place] [address]    Report Status 05/06/2017 FINAL  Final    Coagulation Studies: No results for input(s): LABPROT, INR in the last 72 hours.  Urinalysis: No results for input(s): COLORURINE, LABSPEC, PHURINE, GLUCOSEU, HGBUR, BILIRUBINUR, KETONESUR, PROTEINUR, UROBILINOGEN, NITRITE, LEUKOCYTESUR in the last 72 hours.  Invalid input(s): APPERANCEUR    Imaging: No results found.   Medications:   .  ceFAZolin (ANCEF) IV Stopped (05/07/17 1100)  . magnesium sulfate 1 - 4 g bolus IVPB Stopped (05/04/17 0150)   . allopurinol  300 mg Oral Daily  . cholecalciferol  1,000 Units Oral Daily  . docusate sodium  100 mg Oral BID  . feeding supplement (ENSURE ENLIVE)  237 mL Oral BID BM  . ferrous sulfate  325 mg Oral Q breakfast  . folic acid  1 mg Oral Daily  . levothyroxine  75 mcg Oral QAC breakfast  . midodrine  10 mg Oral TID WC  . mometasone-formoterol  2 puff Inhalation BID  . multivitamin with minerals  1 tablet Oral Daily  . pentoxifylline  400 mg Oral Daily  . tamsulosin  0.4 mg Oral QPC supper  . thiamine  100 mg Oral Daily  Or  . thiamine  100 mg Intravenous Daily   acetaminophen **OR** acetaminophen, albuterol, bisacodyl, HYDROcodone-acetaminophen, iopamidol, ondansetron **OR** ondansetron (ZOFRAN) IV, traZODone  Assessment/ Plan:  83 y.o. African-American male with hypertension, history of myelodysplastic syndrome, cholelithiasis, cirrhosis of the liver,  alcohol abuse, ascites, was admitted on 05/02/2017 with fevers.  Found to have severe ascites and worsening renal failure.  Underwent paracentesis on 5/3 with 2 L of fluid was removed.  Nephrology consult for acute renal failure  1.  Acute renal failure, likely multifactorial Patient had IV contrast exposure at admission for CT abdomen and pelvis.  This likely contributed to acute kidney injury.  Also possibility of ATN from ongoing infection/sepsis.    Unlikely hepatorenal syndrome 2.  E. Coli sepsis 3.  Urinary retention  4.  Cirrhosis of the liver/ascites  Plan: The patient's acute renal failure continues to improve.  It appears that he is recovering from IV contrast exposure.  Creatinine down to 1.29.  Encouraged patient to maintain adequate fluid hydration status.  He verbalized understanding of this.  Avoid additional nephrotoxins as possible.  Treatment of Escherichia coli sepsis as per hospitalist.    LOS: 5 Abiageal Blowe 5/7/20192:55 PM  [place], [place]  Note: This note was prepared with Dragon dictation. Any transcription errors are unintentional

## 2017-05-07 NOTE — Progress Notes (Signed)
Foley catheter inserted . Pt. has tolerated the procedure fairly. Inserted for urinary retention.Will be discharged today will foley in placed.

## 2017-05-08 ENCOUNTER — Telehealth: Payer: Self-pay | Admitting: *Deleted

## 2017-05-08 LAB — HEPATITIS C VRS RNA DETECT BY PCR-QUAL: Hepatitis C Vrs RNA by PCR-Qual: NEGATIVE

## 2017-05-08 LAB — HEPATITIS B E ANTIGEN: Hep B E Ag: NEGATIVE

## 2017-05-08 LAB — HEPATITIS B CORE ANTIBODY, TOTAL: HEP B C TOTAL AB: NEGATIVE

## 2017-05-08 LAB — HEPATITIS A ANTIBODY, IGM: Hep A IgM: NEGATIVE

## 2017-05-08 NOTE — Telephone Encounter (Signed)
Lmom for Joseph Hawkins if he would be willing to come in for a consult for TIPS procedure with Dr. Laurence Ferrari.[name]

## 2017-05-09 ENCOUNTER — Telehealth: Payer: Self-pay | Admitting: *Deleted

## 2017-05-09 NOTE — Telephone Encounter (Signed)
I s/w Mr. Sobieski daughter [name] and wife on 3 way call. Mr. Lenhoff was unable to come to the phone. I went over having a TIPS consult with Dr. Laurence Ferrari.The family said they will think it over and call back if they decide to have the consult./vm

## 2017-05-11 LAB — CMV DNA, QUANTITATIVE, PCR
CMV DNA QUANT: NEGATIVE [IU]/mL
Log10 CMV Qn DNA Pl: UNDETERMINED log10 IU/mL

## 2017-05-13 ENCOUNTER — Encounter: Payer: Self-pay | Admitting: Gastroenterology

## 2017-05-14 ENCOUNTER — Ambulatory Visit: Payer: Medicare HMO | Admitting: Urology

## 2017-05-14 DIAGNOSIS — R339 Retention of urine, unspecified: Secondary | ICD-10-CM

## 2017-05-14 NOTE — Progress Notes (Signed)
05/14/2017 12:20 PM   Joseph Hawkins [birth year] [id number]  Referring provider: Baxter Hire, MD [place], [place] [address]  Chief Complaint  Patient presents with  . New Patient (Initial Visit)    urinary retention    HPI: 83 year old male who presents to urology for further evaluation of urinary retention.  He is recently admitted to the hospital and discharged on 05/07/2017 for E. coli sepsis with multiorgan failure secondary to bacterial peritonitis.  During this admission, he was noted to have acute kidney injury presumably related to hypotension as well as urinary retention.  Per his discharge summary, he failed multiple voiding trials while in the inpatient setting.  He denies any significant baseline urinary symptoms.  No personal history of urinary retention.  While in the hospital, PSA was checked on 05/05/2017 which was 1.57.  Creatinine was as high as 2.54 during recent admission, baseline around 1.03.  I returned back down to 1.29 prior to discharge.  He does have multiple medical comorbidities mostly related to alcoholism including cirrhosis, ascites, esophageal varices, etc.   He currently has a Foley catheter in place.  A fill and pull voiding trial was performed this morning and the patient had a large bladder spasm around his catheter equal to the size of the instilled fluid.  The Foley catheter was subsequently removed.   PMH: Past Medical History:  Diagnosis Date  . Anemia   . Chronic kidney disease   . Hypertension   . MDS (myelodysplastic syndrome) ([facility]) 08/02/2015    Surgical History: Past Surgical History:  Procedure Laterality Date  . RADIOLOGY WITH ANESTHESIA N/A 05/07/2013   Procedure: RADIOLOGY WITH ANESTHESIA;  Surgeon: Jacqulynn Cadet, MD;  Location: [facility];  Service: Radiology;  Laterality: N/A;    Home Medications:  Allergies as of 05/14/2017   No Known Allergies     Medication List        Accurate as of 05/14/17  12:20 PM. Always use your most recent med list.          albuterol (2.5 MG/3ML) 0.083% nebulizer solution Commonly known as:  PROVENTIL Take 3 mLs (2.5 mg total) by nebulization every 6 (six) hours as needed for wheezing or shortness of breath.   allopurinol 300 MG tablet Commonly known as:  ZYLOPRIM Take 300 mg by mouth daily.   cefUROXime 500 MG tablet Commonly known as:  CEFTIN Take 1 tablet (500 mg total) by mouth 2 (two) times daily for 10 days.   cholecalciferol 1000 units tablet Commonly known as:  VITAMIN D Take 1,000 Units by mouth daily.   FERROUSUL 325 (65 FE) MG tablet Generic drug:  ferrous sulfate Take 325 mg by mouth daily with breakfast.   folic acid 1 MG tablet Commonly known as:  FOLVITE Take 1 tablet (1 mg total) by mouth daily.   levothyroxine 75 MCG tablet Commonly known as:  SYNTHROID, LEVOTHROID Take 75 mcg by mouth daily before breakfast.   midodrine 10 MG tablet Commonly known as:  PROAMATINE Take 1 tablet (10 mg total) by mouth 3 (three) times daily with meals.   mometasone-formoterol 100-5 MCG/ACT Aero Commonly known as:  DULERA Inhale 2 puffs into the lungs 2 (two) times daily.   pantoprazole 40 MG tablet Commonly known as:  PROTONIX Take 1 tablet (40 mg total) by mouth daily.   pentoxifylline 400 MG CR tablet Commonly known as:  TRENTAL Take 1 tablet (400 mg total) by mouth daily.   tamsulosin 0.4 MG  Caps capsule Commonly known as:  FLOMAX Take 1 capsule (0.4 mg total) by mouth daily.   thiamine 100 MG tablet Take 1 tablet (100 mg total) by mouth daily.       Allergies: No Known Allergies  Family History: Family History  Family history unknown: Yes    Social History:  reports that he has never smoked. He has never used smokeless tobacco. He reports that he drinks about 2.4 oz of alcohol per week. He reports that he does not use drugs.   Physical Exam: Constitutional:  Alert and oriented, No acute distress.   Elderly-appearing.  Accompanied by wife today.  Wearing bilateral orthotic shoes. HEENT: [place] AT, moist mucus membranes.  Trachea midline, no masses. Cardiovascular: No clubbing, cyanosis, or edema. Respiratory: Normal respiratory effort, no increased work of breathing. GI: Abdomen is soft, nontender, nondistended, no abdominal masses Rectal: Normal sphincter tone.  Normal prostate but given habitus and patient positioning, only able to palpate apex and mid prostate which was unremarkable, no nodules, nontender. Skin: No rashes, bruises or suspicious lesions. Neurologic: Grossly intact, no focal deficits, moving all 4 extremities. Psychiatric: Normal mood and affect.  Laboratory Data: Lab Results  Component Value Date   WBC 3.2 (L) 05/07/2017   HGB 11.7 (L) 05/07/2017   HCT 33.3 (L) 05/07/2017   MCV 117.1 (H) 05/07/2017   PLT 61 (L) 05/07/2017    Lab Results  Component Value Date   CREATININE 1.29 (H) 05/07/2017     Lab Results  Component Value Date   HGBA1C  10/14/2009    5.4 (NOTE)                                                                       According to the ADA Clinical Practice Recommendations for 2011, when HbA1c is used as a screening test:   >=6.5%   Diagnostic of Diabetes Mellitus           (if abnormal result  is confirmed)  5.7-6.4%   Increased risk of developing Diabetes Mellitus  References:Diagnosis and Classification of Diabetes Mellitus,Diabetes YIFO,2774,12(INOMV 1):S62-S69 and Standards of Medical Care in         Diabetes - 2011,Diabetes Care,2011,34  (Suppl 1):S11-S61.    Urinalysis N/a  Pertinent Imaging: CT abdomen pelvis from 05/02/2017 personally reviewed today.  No significant prostamegaly or GU pathology.  Sequela of cirrhosis appreciated.  Assessment & Plan:    1. Urinary retention Urinary retention, likely multifactorial in the setting of acute medical illness, sepsis, and underlying BPH Continue Flomax which he has been chronically  taking Essentially successful voiding trial today, large bladder spasm around the catheter prior to its removal We have advised the patient to return later today if he is unable to void spontaneously, limitations of bladder scan with underlying ascites Rectal exam today and PSA reassuring without concern for underlying malignancy  Plan for recheck in 1 month to ensure he is voiding spontaneously  Return in about 1 month (around 06/11/2017) for shannon pvr, ipss.  Hollice Espy, MD  [facility] [address] [place], [place] [address] [phone]

## 2017-05-30 ENCOUNTER — Emergency Department
Admission: EM | Admit: 2017-05-30 | Discharge: 2017-05-30 | Disposition: A | Discharge status: Home / Self Care | Payer: Medicare HMO | Attending: Emergency Medicine | Admitting: Emergency Medicine

## 2017-05-30 ENCOUNTER — Emergency Department: Payer: Medicare HMO

## 2017-05-30 ENCOUNTER — Encounter: Payer: Self-pay | Admitting: Emergency Medicine

## 2017-05-30 DIAGNOSIS — I4891 Unspecified atrial fibrillation: Secondary | ICD-10-CM | POA: Diagnosis not present

## 2017-05-30 DIAGNOSIS — I129 Hypertensive chronic kidney disease with stage 1 through stage 4 chronic kidney disease, or unspecified chronic kidney disease: Secondary | ICD-10-CM | POA: Diagnosis not present

## 2017-05-30 DIAGNOSIS — R05 Cough: Secondary | ICD-10-CM | POA: Insufficient documentation

## 2017-05-30 DIAGNOSIS — R197 Diarrhea, unspecified: Secondary | ICD-10-CM | POA: Diagnosis not present

## 2017-05-30 DIAGNOSIS — R059 Cough, unspecified: Secondary | ICD-10-CM

## 2017-05-30 DIAGNOSIS — Z79899 Other long term (current) drug therapy: Secondary | ICD-10-CM | POA: Insufficient documentation

## 2017-05-30 DIAGNOSIS — N189 Chronic kidney disease, unspecified: Secondary | ICD-10-CM | POA: Insufficient documentation

## 2017-05-30 LAB — COMPREHENSIVE METABOLIC PANEL
ALK PHOS: 216 U/L — AB (ref 38–126)
ALT: 26 U/L (ref 17–63)
AST: 52 U/L — ABNORMAL HIGH (ref 15–41)
Albumin: 2.6 g/dL — ABNORMAL LOW (ref 3.5–5.0)
Anion gap: 11 (ref 5–15)
BILIRUBIN TOTAL: 6.4 mg/dL — AB (ref 0.3–1.2)
BUN: 16 mg/dL (ref 6–20)
CALCIUM: 9.5 mg/dL (ref 8.9–10.3)
CO2: 21 mmol/L — ABNORMAL LOW (ref 22–32)
CREATININE: 1.49 mg/dL — AB (ref 0.61–1.24)
Chloride: 102 mmol/L (ref 101–111)
GFR, EST AFRICAN AMERICAN: 49 mL/min — AB (ref >60)
GFR, EST NON AFRICAN AMERICAN: 42 mL/min — AB (ref >60)
Glucose, Bld: 94 mg/dL (ref 65–99)
Potassium: 4.6 mmol/L (ref 3.5–5.1)
SODIUM: 134 mmol/L — AB (ref 135–145)
TOTAL PROTEIN: 7.5 g/dL (ref 6.5–8.1)

## 2017-05-30 LAB — CBC
HCT: 38.3 % — ABNORMAL LOW (ref 40.0–52.0)
Hemoglobin: 13.2 g/dL (ref 13.0–18.0)
MCH: 41.2 pg — AB (ref 26.0–34.0)
MCHC: 34.6 g/dL (ref 32.0–36.0)
MCV: 119.2 fL — ABNORMAL HIGH (ref 80.0–100.0)
PLATELETS: 104 10*3/uL — AB (ref 150–440)
RBC: 3.21 MIL/uL — AB (ref 4.40–5.90)
RDW: 16.5 % — ABNORMAL HIGH (ref 11.5–14.5)
WBC: 5.4 10*3/uL (ref 3.8–10.6)

## 2017-05-30 LAB — LIPASE, BLOOD: Lipase: 37 U/L (ref 11–51)

## 2017-05-30 NOTE — Discharge Instructions (Addendum)
He had been seen in the emergency department for a cough.  Your work-up shows likely new onset atrial fibrillation.  Please call your cardiologist at the number provided above to arrange a follow-up appointment as soon as possible.  Return to the emergency department for any chest pain or trouble breathing.

## 2017-05-30 NOTE — ED Provider Notes (Signed)
[facility] Emergency Department Provider Note  Time seen: 2:28 PM  I have reviewed the triage vital signs and the nursing notes.   HISTORY  Chief Complaint Cough and Diarrhea    HPI Joseph Hawkins is a 83 y.o. male with a past medical history of anemia, CKD, hypertension, cirrhosis, presents to the emergency department for cough and diarrhea.  According to the patient for the past 2 days he has had a cough with clear sputum production.  Denies any fever.  Denies any chest pain or significant shortness of breath.  Does have mild lower extremity edema which is chronic.  Denies any pain.  Patient also states somewhat worse diarrhea over the past few days compared to baseline.  Denies any black or bloody stool.  No fever.  No abdominal pain.   Past Medical History:  Diagnosis Date  . Anemia   . Chronic kidney disease   . Hypertension   . MDS (myelodysplastic syndrome) ([place]) 08/02/2015    Patient Active Problem List   Diagnosis Date Noted  . Sepsis ([place]) 05/02/2017  . MDS (myelodysplastic syndrome) ([place]) 08/02/2015  . Atrial fibrillation with RVR ([place]) 05/13/2013  . Diarrhea 05/13/2013  . Esophageal varices in cirrhosis ([place]) 05/06/2013  . Anemia 05/06/2013  . UGIB (upper gastrointestinal bleed) 05/06/2013  . Hemorrhagic shock ([place]) 05/06/2013  . Alcohol withdrawal ([place]) 05/06/2013  . Acute renal failure ([place]) 05/06/2013  . Alcoholic cirrhosis ([place]) 56/38/7564  . Coagulopathy ([place]) 05/06/2013  . Acute respiratory failure ([place]) 05/06/2013    Past Surgical History:  Procedure Laterality Date  . RADIOLOGY WITH ANESTHESIA N/A 05/07/2013   Procedure: RADIOLOGY WITH ANESTHESIA;  Surgeon: Jacqulynn Cadet, MD;  Location: [place];  Service: Radiology;  Laterality: N/A;    Prior to Admission medications   Medication Sig Start Date End Date Taking? Authorizing Provider  allopurinol (ZYLOPRIM) 300 MG tablet Take 300 mg by mouth daily.   Yes [provider]   cholecalciferol (VITAMIN D) 1000 UNITS tablet Take 1,000 Units by mouth daily.   Yes [provider]  ferrous sulfate (FERROUSUL) 325 (65 FE) MG tablet Take 325 mg by mouth daily with breakfast.   Yes [provider]  folic acid (FOLVITE) 1 MG tablet Take 1 tablet (1 mg total) by mouth daily. 05/20/13  Yes Delfina Redwood, MD  levothyroxine (SYNTHROID, LEVOTHROID) 75 MCG tablet Take 75 mcg by mouth daily before breakfast.   Yes [provider]  midodrine (PROAMATINE) 10 MG tablet Take 1 tablet (10 mg total) by mouth 3 (three) times daily with meals. 05/07/17  Yes Dustin Flock, MD  pantoprazole (PROTONIX) 40 MG tablet Take 1 tablet (40 mg total) by mouth daily. Patient taking differently: Take 40 mg by mouth 2 (two) times daily.  05/20/13  Yes Delfina Redwood, MD  pentoxifylline (TRENTAL) 400 MG CR tablet Take 1 tablet (400 mg total) by mouth daily. 05/08/17  Yes Dustin Flock, MD  thiamine 100 MG tablet Take 1 tablet (100 mg total) by mouth daily. 05/20/13  Yes Delfina Redwood, MD  albuterol (PROVENTIL) (2.5 MG/3ML) 0.083% nebulizer solution Take 3 mLs (2.5 mg total) by nebulization every 6 (six) hours as needed for wheezing or shortness of breath. Patient not taking: Reported on 05/02/2017 05/20/13   Delfina Redwood, MD  mometasone-formoterol [facility]) 100-5 MCG/ACT AERO Inhale 2 puffs into the lungs 2 (two) times daily. Patient not taking: Reported on 05/02/2017 05/20/13   Delfina Redwood, MD  tamsulosin [facility])  0.4 MG CAPS capsule Take 1 capsule (0.4 mg total) by mouth daily. Patient not taking: Reported on 05/02/2017 05/20/13   Delfina Redwood, MD    No Known Allergies  Family History  Family history unknown: Yes    Social History Social History   Tobacco Use  . Smoking status: Never Smoker  . Smokeless tobacco: Never Used  Substance Use Topics  . Alcohol use: Yes    Alcohol/week: 2.4 oz    Types: 4 Shots of liquor per week    Comment: 4  shots of Liquor a day  . Drug use: No    Review of Systems Constitutional: Negative for fever. Eyes: Negative for visual complaints ENT: Negative for recent illness/congestion Cardiovascular: Negative for chest pain. Respiratory: Negative for shortness of breath.  Positive cough with clear sputum production x2 days. Gastrointestinal: Negative for abdominal pain, vomiting.  Increased diarrhea from baseline. Genitourinary: Negative for urinary compaints Musculoskeletal: Lower extremity edema, chronic.  No tenderness. Skin: Negative for skin complaints  Neurological: Negative for headache All other ROS negative  ____________________________________________   PHYSICAL EXAM:  VITAL SIGNS: ED Triage Vitals  Enc Vitals Group     BP 05/30/17 1130 116/64     Pulse Rate 05/30/17 1130 (!) 127     Resp 05/30/17 1130 18     Temp 05/30/17 1130 97.9 F (36.6 C)     Temp Source 05/30/17 1130 Oral     SpO2 05/30/17 1130 96 %     Weight 05/30/17 1132 210 lb (95.3 kg)     Height 05/30/17 1132 6\' 1"  (1.854 m)     Head Circumference --      Peak Flow --      Pain Score 05/30/17 1132 0     Pain Loc --      Pain Edu? --      Excl. in [place]? --    Constitutional: Alert and oriented. Well appearing and in no distress. Eyes: Normal exam ENT   Head: Normocephalic and atraumatic.   Mouth/Throat: Mucous membranes are moist. Cardiovascular: Normal rate, regular rhythm around 70 bpm.  Respiratory: Normal respiratory effort without tachypnea nor retractions. Breath sounds are clear Gastrointestinal: Soft, moderate distention, dull percussion but nontender to palpation.  History of cirrhosis. Musculoskeletal: Nontender with normal range of motion in all extremities.  Mild lower extremity edema bilaterally.  Nontender. Neurologic:  Normal speech and language. No gross focal neurologic deficits Skin:  Skin is warm, dry and intact.  Psychiatric: Mood and affect are normal.    ____________________________________________    EKG  EKG reviewed and interpreted by myself shows atrial fibrillation at 111 bpm, narrow QRS, left axis deviation, nonspecific intervals with nonspecific ST changes.  No ST elevation.  ____________________________________________    RADIOLOGY  Chest x-ray is negative  ____________________________________________   INITIAL IMPRESSION / ASSESSMENT AND PLAN / ED COURSE  Pertinent labs & imaging results that were available during my care of the patient were reviewed by me and considered in my medical decision making (see chart for details).  Patient presents to the emergency department for increased diarrhea and cough.  Overall the patient appears well, no distress.  Differential would include upper respiratory infection, pulmonary edema, pneumonia.  Patient has known cirrhosis with an elevated bilirubin however this is also known to the patient and family.  Completely nontender abdominal exam.  Labs are largely at his baseline.  White blood cell count is reassuringly normal.  We will obtain a chest x-ray to  further evaluate.  Patient and family agreeable to this plan of care.  Triage heart rate documented at 45 however the patient has been on the monitor in the room with a heart rate in the 70s.  Review the patient's records, was recently discharged 05/07/2017 after an admission for possible bacterial peritonitis.  Patient has completed his course of antibiotics.  Reassuringly today the patient's white blood cell count is normal and he has a completely nontender abdominal exam.  Patient is EKG does appear to show new onset atrial fibrillation.  Do not see a history of this in the past.  Given the patient's liver disease I do not believe we should start the patient on anticoagulation at this time but I believe rapid cardiology follow-up is warranted.  Will refer to cardiology.  Family agreeable to this plan of care.  Patient is rate controlled  currently 70 bpm.  ____________________________________________   FINAL CLINICAL IMPRESSION(S) / ED DIAGNOSES  Cough Diarrhea Atrial fibrillation   Harvest Dark, MD 05/30/17 1536

## 2017-05-30 NOTE — ED Triage Notes (Signed)
Diarrhea, abdominal swelling and loss of appetitis since sometime in April.  Since it  Has been so hot his cough is worse and he has pain in upper left chest. Denies abdominal pain.

## 2017-06-03 ENCOUNTER — Encounter: Payer: Self-pay | Admitting: Gastroenterology

## 2017-06-03 ENCOUNTER — Ambulatory Visit: Payer: Medicare HMO | Admitting: Gastroenterology

## 2017-06-03 VITALS — BP 109/65 | HR 86 | Ht 73.0 in | Wt 210.0 lb

## 2017-06-03 DIAGNOSIS — K7031 Alcoholic cirrhosis of liver with ascites: Secondary | ICD-10-CM | POA: Diagnosis not present

## 2017-06-03 DIAGNOSIS — R197 Diarrhea, unspecified: Secondary | ICD-10-CM

## 2017-06-03 NOTE — Progress Notes (Signed)
Patient complains of productive cough and diarrhea x4 daily

## 2017-06-03 NOTE — Progress Notes (Signed)
Jonathon Bellows MD, MRCP(U.K) [address]  [place]  [place], [place] [address]  Main: [phone]  Fax: [phone]   Primary Care Physician: Baxter Hire, MD  Primary Gastroenterologist:  Dr. Jonathon Bellows   Chief Complaint  Patient presents with  . Diarrhea  . Cough    HPI: Joseph Hawkins is a 83 y.o. male   Summary of history :  He was admitted in early may with E coli sepsis, AKI. Commenced on Trental for alcoholic hepatitis.   Interval history   5//2019-  06/03/2017 Seen at the ER 3 days back with cough and diarrhea.   Labs 05/30/17- Hb 13.2 grams. Cr 1.49  Hep C negative, Hepatitis B ag-negative.  Stopped all alcohol. Says he has had diarrhea for 2 weeks, 5 bowel movements a day , no blood. Recalls last colonoscopy some years back cant recall.   Current Outpatient Medications  Medication Sig Dispense Refill  . albuterol (PROVENTIL) (2.5 MG/3ML) 0.083% nebulizer solution Take 3 mLs (2.5 mg total) by nebulization every 6 (six) hours as needed for wheezing or shortness of breath. 75 mL 12  . allopurinol (ZYLOPRIM) 300 MG tablet Take 300 mg by mouth daily.    . cholecalciferol (VITAMIN D) 1000 UNITS tablet Take 1,000 Units by mouth daily.    . ferrous sulfate (FERROUSUL) 325 (65 FE) MG tablet Take 325 mg by mouth daily with breakfast.    . folic acid (FOLVITE) 1 MG tablet Take 1 tablet (1 mg total) by mouth daily.    Marland Kitchen levothyroxine (SYNTHROID, LEVOTHROID) 75 MCG tablet Take 75 mcg by mouth daily before breakfast.    . midodrine (PROAMATINE) 10 MG tablet Take 1 tablet (10 mg total) by mouth 3 (three) times daily with meals. 90 tablet 2  . mometasone-formoterol (DULERA) 100-5 MCG/ACT AERO Inhale 2 puffs into the lungs 2 (two) times daily.    . pantoprazole (PROTONIX) 40 MG tablet Take 1 tablet (40 mg total) by mouth daily. (Patient taking differently: Take 40 mg by mouth 2 (two) times daily. )    . pentoxifylline (TRENTAL) 400 MG CR tablet Take 1 tablet (400 mg total)  by mouth daily. 30 tablet 0  . tamsulosin (FLOMAX) 0.4 MG CAPS capsule Take 1 capsule (0.4 mg total) by mouth daily. 30 capsule   . thiamine 100 MG tablet Take 1 tablet (100 mg total) by mouth daily.     No current facility-administered medications for this visit.     Allergies as of 06/03/2017  . (No Known Allergies)    ROS:  General: Negative for anorexia, weight loss, fever, chills, fatigue, weakness. ENT: Negative for hoarseness, difficulty swallowing , nasal congestion. CV: Negative for chest pain, angina, palpitations, dyspnea on exertion, peripheral edema.  Respiratory: Negative for dyspnea at rest, dyspnea on exertion, cough, sputum, wheezing.  GI: See history of present illness. GU:  Negative for dysuria, hematuria, urinary incontinence, urinary frequency, nocturnal urination.  Endo: Negative for unusual weight change.    Physical Examination:   BP 109/65   Pulse 86   Ht 6\' 1"  (1.854 m)   Wt 210 lb (95.3 kg)   BMI 27.71 kg/m   General: Well-nourished, well-developed in no acute distress.  Eyes: No icterus. Conjunctivae pink. Mouth: Oropharyngeal mucosa moist and pink , no lesions erythema or exudate. Lungs: Clear to auscultation bilaterally. Non-labored. Heart: Regular rate and rhythm, no murmurs rubs or gallops.  Abdomen: Bowel sounds are normal, nontender, distended , free fluid +, no hepatosplenomegaly  or masses, no abdominal bruits or hernia , no rebound or guarding.   Extremities: No lower extremity edema. No clubbing or deformities. Neuro: Alert and oriented x 3.  Grossly intact. Skin: Warm and dry, no jaundice.   Psych: Alert and cooperative, normal mood and affect.   Imaging Studies: Dg Chest 2 View  Result Date: 05/30/2017 CLINICAL DATA:  Cough.  Chest pain. EXAM: CHEST - 2 VIEW COMPARISON:  Radiographs of May 02, 2017. FINDINGS: Stable cardiomediastinal silhouette. Both lungs are clear. No pneumothorax or pleural effusion is noted. The visualized  skeletal structures are unremarkable. IMPRESSION: No active cardiopulmonary disease. Electronically Signed   By: Marijo Conception, M.D.   On: 05/30/2017 15:02    Assessment and Plan:   Joseph Hawkins is a 83 y.o. y/o male here to follow up from his discahrge when he was admitted with alcoholic hepatitis, e coli sepsis and ewas treated with antibiotics. He also had AKI presently creatinine is stable a bit better than inpatient. Check INR, Hep A serology , autoimmune serology .    Plan  1. Stop all alcohol  2.  IF we are not able to control his ascites may need TIPS- still has ascites on exam - cannot use diuretics due to elevated creatinine. Will decide at next visit.  3. Calculate MELD score and decide on transplant referral once he is off all alcohol for 6 months.  4. Low salt diet  5. Stop Trental , stop PPI 6. Check stool for PCR, C diff.   Dr Jonathon Bellows  MD,MRCP [facility]) Follow up in 2 weeks

## 2017-06-06 ENCOUNTER — Telehealth: Payer: Self-pay | Admitting: Gastroenterology

## 2017-06-06 NOTE — Telephone Encounter (Signed)
Joseph Hawkins from [facility] is calling pt dropped off Stool sample with them please call [phone] ext [phone]

## 2017-06-06 NOTE — Telephone Encounter (Signed)
[facility] sent the C-Diff to [facility] by Dr. Edwina Barth.  Called and advised the patient that the labs were left at the wrong location.   Also, blood draws were not completed.  Explained that the lab orders and samples have been disposed of by [employer].

## 2017-06-07 ENCOUNTER — Other Ambulatory Visit
Admission: RE | Admit: 2017-06-07 | Discharge: 2017-06-07 | Disposition: A | Discharge status: Home / Self Care | Payer: Medicare HMO | Source: Ambulatory Visit | Attending: Gastroenterology | Admitting: Gastroenterology

## 2017-06-07 ENCOUNTER — Encounter: Payer: Self-pay | Admitting: Gastroenterology

## 2017-06-07 ENCOUNTER — Other Ambulatory Visit: Payer: Self-pay | Admitting: Internal Medicine

## 2017-06-07 DIAGNOSIS — K7031 Alcoholic cirrhosis of liver with ascites: Secondary | ICD-10-CM | POA: Insufficient documentation

## 2017-06-07 DIAGNOSIS — R197 Diarrhea, unspecified: Secondary | ICD-10-CM | POA: Diagnosis present

## 2017-06-07 LAB — IRON AND TIBC
Iron: 129 ug/dL (ref 45–182)
Saturation Ratios: 79 % — ABNORMAL HIGH (ref 17.9–39.5)
TIBC: 162 ug/dL — AB (ref 250–450)
UIBC: 33 ug/dL

## 2017-06-07 LAB — PROTIME-INR
INR: 1.4
Prothrombin Time: 17 seconds — ABNORMAL HIGH (ref 11.4–15.2)

## 2017-06-07 LAB — FERRITIN: FERRITIN: 868 ng/mL — AB (ref 24–336)

## 2017-06-07 NOTE — Discharge Instructions (Signed)
Paracentesis, Care After °Refer to this sheet in the next few weeks. These instructions provide you with information about caring for yourself after your procedure. Your health care provider may also give you more specific instructions. Your treatment has been planned according to current medical practices, but problems sometimes occur. Call your health care provider if you have any problems or questions after your procedure. °What can I expect after the procedure? °After your procedure, it is common to have a small amount of clear fluid coming from the puncture site. °Follow these instructions at home: °· Return to your normal activities as told by your health care provider. Ask your health care provider what activities are safe for you. °· Take over-the-counter and prescription medicines only as told by your health care provider. °· Do not take baths, swim, or use a hot tub until your health care provider approves. °· Follow instructions from your health care provider about: °? How to take care of your puncture site. °? When and how you should change your bandage (dressing). °? When you should remove your dressing. °· Check your puncture area every day signs of infection. Watch for: °? Redness, swelling, or pain. °? Fluid, blood, or pus. °· Keep all follow-up visits as told by your health care provider. This is important. °Contact a health care provider if: °· You have redness, swelling, or pain at your puncture site. °· You start to have more clear fluid coming from your puncture site. °· You have blood or pus coming from your puncture site. °· You have chills. °· You have a fever. °Get help right away if: °· You develop chest pain or shortness of breath. °· You develop increasing pain, discomfort, or swelling in your abdomen. °· You feel dizzy or light-headed or you pass out. °This information is not intended to replace advice given to you by your health care provider. Make sure you discuss any questions you  have with your health care provider. °Document Released: 05/04/2014 Document Revised: 05/26/2015 Document Reviewed: 03/02/2014 °Elsevier Interactive Patient Education © 2018 [employer] ° °

## 2017-06-08 LAB — ANA W/REFLEX IF POSITIVE: ANA: NEGATIVE

## 2017-06-08 LAB — HEPATITIS A ANTIBODY, TOTAL: Hep A Total Ab: NEGATIVE

## 2017-06-08 LAB — MITOCHONDRIAL ANTIBODIES

## 2017-06-08 LAB — ANTI-SMOOTH MUSCLE ANTIBODY, IGG: F-Actin IgG: 10 Units (ref 0–19)

## 2017-06-09 LAB — CELIAC DISEASE PANEL
ENDOMYSIAL ANTIBODY IGA: NEGATIVE
IgA: 1672 mg/dL — ABNORMAL HIGH (ref 61–437)
TISSUE TRANSGLUTAMINASE AB, IGA: 2 U/mL (ref 0–3)

## 2017-06-10 ENCOUNTER — Ambulatory Visit
Admission: RE | Admit: 2017-06-10 | Discharge: 2017-06-10 | Disposition: A | Discharge status: Home / Self Care | Payer: Medicare HMO | Source: Ambulatory Visit | Attending: Internal Medicine | Admitting: Internal Medicine

## 2017-06-10 DIAGNOSIS — K7031 Alcoholic cirrhosis of liver with ascites: Secondary | ICD-10-CM

## 2017-06-10 DIAGNOSIS — I4891 Unspecified atrial fibrillation: Secondary | ICD-10-CM | POA: Insufficient documentation

## 2017-06-10 LAB — ALPHA-1 ANTITRYPSIN PHENOTYPE: A1 ANTITRYPSIN SER: 147 mg/dL (ref 90–200)

## 2017-06-16 ENCOUNTER — Emergency Department: Payer: Medicare HMO

## 2017-06-16 ENCOUNTER — Emergency Department
Admission: EM | Admit: 2017-06-16 | Discharge: 2017-06-16 | Disposition: A | Discharge status: Home / Self Care | Payer: Medicare HMO | Attending: Student in an Organized Health Care Education/Training Program | Admitting: Student in an Organized Health Care Education/Training Program

## 2017-06-16 ENCOUNTER — Other Ambulatory Visit: Payer: Self-pay

## 2017-06-16 DIAGNOSIS — I129 Hypertensive chronic kidney disease with stage 1 through stage 4 chronic kidney disease, or unspecified chronic kidney disease: Secondary | ICD-10-CM | POA: Diagnosis not present

## 2017-06-16 DIAGNOSIS — Z79899 Other long term (current) drug therapy: Secondary | ICD-10-CM | POA: Diagnosis not present

## 2017-06-16 DIAGNOSIS — N189 Chronic kidney disease, unspecified: Secondary | ICD-10-CM | POA: Insufficient documentation

## 2017-06-16 DIAGNOSIS — E039 Hypothyroidism, unspecified: Secondary | ICD-10-CM | POA: Insufficient documentation

## 2017-06-16 DIAGNOSIS — R0602 Shortness of breath: Secondary | ICD-10-CM

## 2017-06-16 DIAGNOSIS — R188 Other ascites: Secondary | ICD-10-CM

## 2017-06-16 DIAGNOSIS — K7031 Alcoholic cirrhosis of liver with ascites: Secondary | ICD-10-CM | POA: Insufficient documentation

## 2017-06-16 LAB — TROPONIN I: Troponin I: <0.03 ng/mL (ref <0.03)

## 2017-06-16 LAB — BODY FLUID CELL COUNT WITH DIFFERENTIAL
Eos, Fluid: 0 %
Lymphs, Fluid: 17 %
MONOCYTE-MACROPHAGE-SEROUS FLUID: 56 %
Neutrophil Count, Fluid: 27 %
OTHER CELLS FL: 0 %
Total Nucleated Cell Count, Fluid: 163 cu mm

## 2017-06-16 LAB — HEPATIC FUNCTION PANEL
ALBUMIN: 2.4 g/dL — AB (ref 3.5–5.0)
ALK PHOS: 268 U/L — AB (ref 38–126)
ALT: 36 U/L (ref 17–63)
AST: 77 U/L — AB (ref 15–41)
Bilirubin, Direct: 2.5 mg/dL — ABNORMAL HIGH (ref 0.1–0.5)
Indirect Bilirubin: 3.3 mg/dL — ABNORMAL HIGH (ref 0.3–0.9)
Total Bilirubin: 5.8 mg/dL — ABNORMAL HIGH (ref 0.3–1.2)
Total Protein: 7 g/dL (ref 6.5–8.1)

## 2017-06-16 LAB — CBC
HEMATOCRIT: 39.9 % — AB (ref 40.0–52.0)
HEMOGLOBIN: 13.8 g/dL (ref 13.0–18.0)
MCH: 41 pg — ABNORMAL HIGH (ref 26.0–34.0)
MCHC: 34.5 g/dL (ref 32.0–36.0)
MCV: 118.9 fL — AB (ref 80.0–100.0)
PLATELETS: 102 10*3/uL — AB (ref 150–440)
RBC: 3.36 MIL/uL — AB (ref 4.40–5.90)
RDW: 15.5 % — ABNORMAL HIGH (ref 11.5–14.5)
WBC: 8.1 10*3/uL (ref 3.8–10.6)

## 2017-06-16 LAB — BASIC METABOLIC PANEL
ANION GAP: 11 (ref 5–15)
BUN: 22 mg/dL — ABNORMAL HIGH (ref 6–20)
CHLORIDE: 102 mmol/L (ref 101–111)
CO2: 20 mmol/L — ABNORMAL LOW (ref 22–32)
Calcium: 9.4 mg/dL (ref 8.9–10.3)
Creatinine, Ser: 1.43 mg/dL — ABNORMAL HIGH (ref 0.61–1.24)
GFR calc non Af Amer: 44 mL/min — ABNORMAL LOW (ref >60)
GFR, EST AFRICAN AMERICAN: 51 mL/min — AB (ref >60)
Glucose, Bld: 115 mg/dL — ABNORMAL HIGH (ref 65–99)
POTASSIUM: 4.2 mmol/L (ref 3.5–5.1)
SODIUM: 133 mmol/L — AB (ref 135–145)

## 2017-06-16 LAB — PROTIME-INR
INR: 1.46
Prothrombin Time: 17.6 seconds — ABNORMAL HIGH (ref 11.4–15.2)

## 2017-06-16 LAB — AMMONIA: Ammonia: 16 umol/L (ref 9–35)

## 2017-06-16 MED ORDER — ONDANSETRON 4 MG PO TBDP
4.0000 mg | ORAL_TABLET | Freq: Once | ORAL | Status: AC
  Administered 2017-06-16: 4 mg via ORAL
  Filled 2017-06-16: qty 1

## 2017-06-16 MED ORDER — ALBUMIN HUMAN 25 % IV SOLN
25.0000 g | Freq: Once | INTRAVENOUS | Status: AC
  Administered 2017-06-16: 25 g via INTRAVENOUS
  Filled 2017-06-16: qty 100

## 2017-06-16 MED ORDER — ONDANSETRON HCL 4 MG/2ML IJ SOLN
4.0000 mg | Freq: Once | INTRAMUSCULAR | Status: AC
  Administered 2017-06-16: 4 mg via INTRAVENOUS
  Filled 2017-06-16: qty 2

## 2017-06-16 MED ORDER — FUROSEMIDE 40 MG PO TABS: 40.0000 mg | ORAL_TABLET | Freq: Once | ORAL | Status: DC

## 2017-06-16 NOTE — ED Notes (Signed)
Patient tolerating albumin infusion well. Dr. Quentin Cornwall dressed paracentesis site.

## 2017-06-16 NOTE — ED Notes (Signed)
Lasix held per Dr. Quentin Cornwall until after paracentesis.

## 2017-06-16 NOTE — ED Provider Notes (Signed)
[facility] Emergency Department Provider Note    First MD Initiated Contact with Patient 06/16/17 1625     (approximate)  I have reviewed the triage vital signs and the nursing notes.   HISTORY  Chief Complaint Shortness of Breath and Fluid Overload    HPI Joseph Hawkins is a 83 y.o. male the history of CKD as well as alcoholic cirrhosis with ascites status post ultrasound large-volume paracentesis done on the 10th yielding 7.5L on spironolactone as well as Lasix 3 presents to the ER for recurrent abdominal swelling and feeling that he is more short of breath.  Denies any pain.  No fevers.  No cough.  Denies any abdominal pain but does feel that this fluid distention recurred very quickly.  Wife states that he did miss a few doses of his diuretics over the week due to other appointments.    Past Medical History:  Diagnosis Date  . Anemia   . Chronic kidney disease   . Hypertension   . Hypothyroidism, unspecified 06/28/2013  . MDS (myelodysplastic syndrome) ([place]) 08/02/2015   Family History  Family history unknown: Yes   Past Surgical History:  Procedure Laterality Date  . RADIOLOGY WITH ANESTHESIA N/A 05/07/2013   Procedure: RADIOLOGY WITH ANESTHESIA;  Surgeon: Jacqulynn Cadet, MD;  Location: [place];  Service: Radiology;  Laterality: N/A;   Patient Active Problem List   Diagnosis Date Noted  . Sepsis ([place]) 05/02/2017  . Hyperglycemia, unspecified 09/15/2015  . MDS (myelodysplastic syndrome) ([place]) 08/02/2015  . Essential hypertension 04/04/2015  . CRI (chronic renal insufficiency), stage 1 03/15/2015  . Palpitations 11/10/2013  . Hypothyroidism, unspecified 06/28/2013  . Gout 06/28/2013  . Esophageal and gastric varices ([place]) 06/28/2013  . Atrial fibrillation with RVR ([place]) 05/13/2013  . Diarrhea 05/13/2013  . Esophageal varices in cirrhosis ([place]) 05/06/2013  . Anemia 05/06/2013  . UGIB (upper gastrointestinal bleed) 05/06/2013  . Hemorrhagic  shock ([place]) 05/06/2013  . Alcohol withdrawal ([place]) 05/06/2013  . Acute renal failure ([place]) 05/06/2013  . Alcoholic cirrhosis ([place]) 59/74/1638  . Coagulopathy ([place]) 05/06/2013  . Acute respiratory failure ([place]) 05/06/2013      Prior to Admission medications   Medication Sig Start Date End Date Taking? Authorizing Provider  albuterol (PROVENTIL) (2.5 MG/3ML) 0.083% nebulizer solution Take 3 mLs (2.5 mg total) by nebulization every 6 (six) hours as needed for wheezing or shortness of breath. 05/20/13   Delfina Redwood, MD  allopurinol (ZYLOPRIM) 300 MG tablet Take 300 mg by mouth daily.    [provider]  cholecalciferol (VITAMIN D) 1000 UNITS tablet Take 1,000 Units by mouth daily.    [provider]  ferrous sulfate (FERROUSUL) 325 (65 FE) MG tablet Take 325 mg by mouth daily with breakfast.    [provider]  folic acid (FOLVITE) 1 MG tablet Take 1 tablet (1 mg total) by mouth daily. 05/20/13   Delfina Redwood, MD  levothyroxine (SYNTHROID, LEVOTHROID) 75 MCG tablet Take 75 mcg by mouth daily before breakfast.    [provider]  midodrine (PROAMATINE) 10 MG tablet Take 1 tablet (10 mg total) by mouth 3 (three) times daily with meals. 05/07/17   Dustin Flock, MD  mometasone-formoterol [facility]) 100-5 MCG/ACT AERO Inhale 2 puffs into the lungs 2 (two) times daily. 05/20/13   Delfina Redwood, MD  tamsulosin (FLOMAX) 0.4 MG CAPS capsule Take 1 capsule (0.4 mg total) by mouth daily. 05/20/13   Delfina Redwood, MD  thiamine 100 MG  tablet Take 1 tablet (100 mg total) by mouth daily. 05/20/13   Delfina Redwood, MD    Allergies Patient has no known allergies.    Social History Social History   Tobacco Use  . Smoking status: Never Smoker  . Smokeless tobacco: Never Used  Substance Use Topics  . Alcohol use: Yes    Alcohol/week: 2.4 oz    Types: 4 Shots of liquor per week    Comment: 4 shots of Liquor a day  . Drug use: No     Review of Systems Patient denies headaches, rhinorrhea, blurry vision, numbness, shortness of breath, chest pain, edema, cough, abdominal pain, nausea, vomiting, diarrhea, dysuria, fevers, rashes or hallucinations unless otherwise stated above in HPI. ____________________________________________   PHYSICAL EXAM:  VITAL SIGNS: Vitals:   06/16/17 1915 06/16/17 1930  BP:  (!) 144/85  Pulse:  87  Resp:  18  Temp: 97.6 F (36.4 C) 97.7 F (36.5 C)  SpO2:  98%    Constitutional: Alert and oriented.  Eyes: Conjunctivae are normal.  Head: Atraumatic. Nose: No congestion/rhinnorhea. Mouth/Throat: Mucous membranes are moist.   Neck: No stridor. Painless ROM.  Cardiovascular: Normal rate, regular rhythm. Grossly normal heart sounds.  Good peripheral circulation. Respiratory: Normal respiratory effort.  No retractions. Lungs CTAB. Gastrointestinal: Soft and nontender. + distention with fluid, the abdomen is non tense. No abdominal bruits. No CVA tenderness. Genitourinary: deferred Musculoskeletal: No lower extremity tenderness nor edema.  No joint effusions. Neurologic:  Normal speech and language. No gross focal neurologic deficits are appreciated. No facial droop Skin:  Skin is warm, dry and intact. No rash noted. Psychiatric: Mood and affect are normal. Speech and behavior are normal.  ____________________________________________   LABS (all labs ordered are listed, but only abnormal results are displayed)  Results for orders placed or performed during the hospital encounter of 06/16/17 (from the past 24 hour(s))  Basic metabolic panel     Status: Abnormal   Collection Time: 06/16/17  1:45 PM  Result Value Ref Range   Sodium 133 (L) 135 - 145 mmol/L   Potassium 4.2 3.5 - 5.1 mmol/L   Chloride 102 101 - 111 mmol/L   CO2 20 (L) 22 - 32 mmol/L   Glucose, Bld 115 (H) 65 - 99 mg/dL   BUN 22 (H) 6 - 20 mg/dL   Creatinine, Ser 1.43 (H) 0.61 - 1.24 mg/dL   Calcium 9.4 8.9 -  10.3 mg/dL   GFR calc non Af Amer 44 (L) >60 mL/min   GFR calc Af Amer 51 (L) >60 mL/min   Anion gap 11 5 - 15  CBC     Status: Abnormal   Collection Time: 06/16/17  1:45 PM  Result Value Ref Range   WBC 8.1 3.8 - 10.6 K/uL   RBC 3.36 (L) 4.40 - 5.90 MIL/uL   Hemoglobin 13.8 13.0 - 18.0 g/dL   HCT 39.9 (L) 40.0 - 52.0 %   MCV 118.9 (H) 80.0 - 100.0 fL   MCH 41.0 (H) 26.0 - 34.0 pg   MCHC 34.5 32.0 - 36.0 g/dL   RDW 15.5 (H) 11.5 - 14.5 %   Platelets 102 (L) 150 - 440 K/uL  Troponin I     Status: None   Collection Time: 06/16/17  1:45 PM  Result Value Ref Range   Troponin I <0.03 <0.03 ng/mL  Hepatic function panel     Status: Abnormal   Collection Time: 06/16/17  1:51 PM  Result Value Ref Range  Total Protein 7.0 6.5 - 8.1 g/dL   Albumin 2.4 (L) 3.5 - 5.0 g/dL   AST 77 (H) 15 - 41 U/L   ALT 36 17 - 63 U/L   Alkaline Phosphatase 268 (H) 38 - 126 U/L   Total Bilirubin 5.8 (H) 0.3 - 1.2 mg/dL   Bilirubin, Direct 2.5 (H) 0.1 - 0.5 mg/dL   Indirect Bilirubin 3.3 (H) 0.3 - 0.9 mg/dL  Protime-INR     Status: Abnormal   Collection Time: 06/16/17  1:51 PM  Result Value Ref Range   Prothrombin Time 17.6 (H) 11.4 - 15.2 seconds   INR 1.46   Ammonia     Status: None   Collection Time: 06/16/17  5:46 PM  Result Value Ref Range   Ammonia 16 9 - 35 umol/L  Body fluid cell count with differential     Status: Abnormal   Collection Time: 06/16/17  6:43 PM  Result Value Ref Range   Fluid Type-FCT FLUID    Color, Fluid YELLOW (A) YELLOW   Appearance, Fluid CLEAR CLEAR   WBC, Fluid 163 cu mm   Neutrophil Count, Fluid 27 %   Lymphs, Fluid 17 %   Monocyte-Macrophage-Serous Fluid 56 %   Eos, Fluid 0 %   Other Cells, Fluid 0 %   ____________________________________________  EKG My review and personal interpretation at Time: 13:47   Indication: abd distension  Rate: 85  Rhythm: sinus with pacs Axis: normal Other: no stemi, nonsepcific st  abn ____________________________________________  RADIOLOGY  I personally reviewed all radiographic images ordered to evaluate for the above acute complaints and reviewed radiology reports and findings.  These findings were personally discussed with the patient.  Please see medical record for radiology report.  ____________________________________________   PROCEDURES  Procedure(s) performed:  ABDOMINAL PARACENTESIS Date/Time: 06/16/2017 7:55 PM Performed by: Merlyn Lot, MD Authorized by: Merlyn Lot, MD  Consent: Verbal consent obtained. Consent given by: patient Patient understanding: patient states understanding of the procedure being performed Patient consent: the patient's understanding of the procedure matches consent given Patient identity confirmed: verbally with patient Preparation: Patient was prepped and draped in the usual sterile fashion. Local anesthesia used: yes Anesthesia: local infiltration  Anesthesia: Local anesthesia used: yes Local Anesthetic: lidocaine 1% without epinephrine Anesthetic total: 10 mL  Sedation: Patient sedated: no  Patient tolerance: Patient tolerated the procedure well with no immediate complications Comments: ~2 L clear yellow fluid drained from RLQ.  `       Critical Care performed: no ____________________________________________   INITIAL IMPRESSION / ASSESSMENT AND PLAN / ED COURSE  Pertinent labs & imaging results that were available during my care of the patient were reviewed by me and considered in my medical decision making (see chart for details).   DDX: recurrent ascites, non compliance, chf, pna, portal vein thrombous  Joseph Hawkins is a 83 y.o. who presents to the ED with abdominal distention and evidence of recurrent ascites.  Seems to have been abrupt onset and recurrence.  Patient reportedly has been off of his diuresis as well as not been compliant with his medications this week.  Ultrasound was  ordered to evaluate for any worsening evidence of portal venous hypertension or thrombosis.  Results of ultrasound discussed with patient.  Blood work is otherwise reassuring.  No evidence of congestive heart failure at this time.  Will perform diagnostic and therapeutic paracentesis to rule out SBP.  Clinical Course as of Jun 17 2010  Sun Jun 16, 2017  7473 Patient tolerated paracentesis without complication.  Little over 2 L were removed of clear fluid.   [PR]  1956 No evidence of SBP based on cell count.  Patient doing well.  No respiratory distress.  He has follow-up with gastroenterology tomorrow.  Believe he stable and appropriate for outpatient follow-up.  Have discussed with the patient and available family all diagnostics and treatments performed thus far and all questions were answered to the best of my ability. The patient demonstrates understanding and agreement with plan.    [PR]    Clinical Course User Index [PR] Merlyn Lot, MD     As part of my medical decision making, I reviewed the following data within the [place] notes reviewed and incorporated, Labs reviewed, notes from prior ED visits and [place] Controlled Substance Database   ____________________________________________   FINAL CLINICAL IMPRESSION(S) / ED DIAGNOSES  Final diagnoses:  Ascites of liver  Alcoholic cirrhosis of liver with ascites ([place])  Shortness of breath      NEW MEDICATIONS STARTED DURING THIS VISIT:  New Prescriptions   No medications on file     Note:  This document was prepared using Dragon voice recognition software and may include unintentional dictation errors.    Merlyn Lot, MD 06/16/17 2012

## 2017-06-16 NOTE — ED Triage Notes (Signed)
Pt arrives to ED c/o of SOB and fluid overload. Had fluid drawn off abd Monday but it's back. abd distended upon assessment. Hx cirrhosis. Alert, oriented.

## 2017-06-16 NOTE — Discharge Instructions (Addendum)
Follow up with your GI doctor tomorrow.  Be sure to discuss the need for more frequent paracentesis vs. Restarting your diuretic medication.     You have been seen in the emergency department for emergency care. It is important that you contact your own doctor, specialist or the closest clinic for follow-up care. Please bring this instruction sheet, all medications and X-ray copies with you when you are seen for follow-up care.  Determining the exact cause for all patients with abdominal pain is extremely difficult in the emergency department. Our primary focus is to rule-out immediate life-threatening diseases. If no immediate source of pain is found the definitive diagnosis frequently needs to be determined over time.Many times your primary care physician can determine the cause by following the symptoms over time. Sometimes, specialist are required such as Gastroenterologists, Gynecologists, Urologists or Surgeons. Please return immediately to the Emergency Department for fever>101, Vomiting or Intractable Pain. You should return to the emergency department or see your primary care provider in 12-24hrs if your pain is no better and sooner if your pain becomes worse.

## 2017-06-16 NOTE — ED Notes (Signed)
Patient taken to ultrasound.

## 2017-06-16 NOTE — ED Notes (Addendum)
Patient vomited after getting dressed. Dr. Quentin Cornwall aware.

## 2017-06-16 NOTE — ED Triage Notes (Signed)
First nurse:  Shortness of breath.  Fluid on abdomen.  Patient no in distress.

## 2017-06-16 NOTE — Progress Notes (Signed)
06/17/2017 3:06 PM   Joseph Hawkins August 17, 1934 [id number]  Referring provider: Baxter Hire, MD [place], [place] [address]  Chief Complaint  Patient presents with  . Urinary Retention    HPI: Patient is an 83 year old [place] American male with a history of urinary retention who presents today for follow-up with his wife, Joseph Hawkins, and daughter, [name].    Background history 83 year old male who presents to urology for further evaluation of urinary retention.  He is recently admitted to the hospital and discharged on 05/07/2017 for E. coli sepsis with multiorgan failure secondary to bacterial peritonitis.  During this admission, he was noted to have acute kidney injury presumably related to hypotension as well as urinary retention.  Per his discharge summary, he failed multiple voiding trials while in the inpatient setting.  He denies any significant baseline urinary symptoms.  No personal history of urinary retention.  While in the hospital, PSA was checked on 05/05/2017 which was 1.57.  Creatinine was as high as 2.54 during recent admission, baseline around 1.03.  I returned back down to 1.29 prior to discharge.  He does have multiple medical comorbidities mostly related to alcoholism including cirrhosis, ascites, esophageal varices, etc.    He was seen yesterday in the ED for another paracentesis.    Foley removed on 05/14/2017.  He states he is not having any issues with urination.    BPH WITH LUTS  (prostate and/or bladder) IPSS score: 1/2       Major complaint(s): Denies any dysuria, hematuria or suprapubic pain.   Denies any recent fevers, chills, nausea or vomiting.  IPSS    Row Name 06/17/17 1400         International Prostate Symptom Score   How often have you had the sensation of not emptying your bladder?  Not at All     How often have you had to urinate less than every two hours?  Not at All     How often have you found you stopped and started  again several times when you urinated?  Not at All     How often have you found it difficult to postpone urination?  Not at All     How often have you had a weak urinary stream?  Not at All     How often have you had to strain to start urination?  Not at All     How many times did you typically get up at night to urinate?  1 Time     Total IPSS Score  1       Quality of Life due to urinary symptoms   If you were to spend the rest of your life with your urinary condition just the way it is now how would you feel about that?  Mostly Satisfied        Score:  1-7 Mild 8-19 Moderate 20-35 Severe  PMH: Past Medical History:  Diagnosis Date  . Anemia   . Chronic kidney disease   . Hypertension   . Hypothyroidism, unspecified 06/28/2013  . MDS (myelodysplastic syndrome) ([place]) 08/02/2015    Surgical History: Past Surgical History:  Procedure Laterality Date  . RADIOLOGY WITH ANESTHESIA N/A 05/07/2013   Procedure: RADIOLOGY WITH ANESTHESIA;  Surgeon: Jacqulynn Cadet, MD;  Location: [facility];  Service: Radiology;  Laterality: N/A;    Home Medications:  Allergies as of 06/17/2017   No Known Allergies     Medication List  Accurate as of 06/17/17  3:06 PM. Always use your most recent med list.          albuterol (2.5 MG/3ML) 0.083% nebulizer solution Commonly known as:  PROVENTIL Take 3 mLs (2.5 mg total) by nebulization every 6 (six) hours as needed for wheezing or shortness of breath.   allopurinol 300 MG tablet Commonly known as:  ZYLOPRIM Take 300 mg by mouth daily.   cholecalciferol 1000 units tablet Commonly known as:  VITAMIN D Take 1,000 Units by mouth daily.   FERROUSUL 325 (65 FE) MG tablet Generic drug:  ferrous sulfate Take 325 mg by mouth daily with breakfast.   folic acid 1 MG tablet Commonly known as:  FOLVITE Take 1 tablet (1 mg total) by mouth daily.   levothyroxine 75 MCG tablet Commonly known as:  SYNTHROID, LEVOTHROID Take 75 mcg by mouth daily  before breakfast.   midodrine 10 MG tablet Commonly known as:  PROAMATINE Take 1 tablet (10 mg total) by mouth 3 (three) times daily with meals.   mometasone-formoterol 100-5 MCG/ACT Aero Commonly known as:  DULERA Inhale 2 puffs into the lungs 2 (two) times daily.   tamsulosin 0.4 MG Caps capsule Commonly known as:  FLOMAX Take 1 capsule (0.4 mg total) by mouth daily.   thiamine 100 MG tablet Take 1 tablet (100 mg total) by mouth daily.   vancomycin 125 MG capsule Commonly known as:  VANCOCIN TAKE 1 CAPSULE (125 MG TOTAL) BY MOUTH 4 (FOUR) TIMES DAILY   vancomycin 125 MG capsule Commonly known as:  VANCOCIN Take by mouth.       Allergies: No Known Allergies  Family History: Family History  Family history unknown: Yes    Social History:  reports that he has never smoked. He has never used smokeless tobacco. He reports that he drinks about 2.4 oz of alcohol per week. He reports that he does not use drugs.   Physical Exam: Blood pressure (!) 97/53, pulse 77, height 6' (1.829 m), weight 181 lb 4.8 oz (82.2 kg). Constitutional: Exhausted  HEENT: [place] AT, moist mucus membranes. Trachea midline, no masses. Cardiovascular: No clubbing, cyanosis, or edema. Respiratory: Normal respiratory effort, no increased work of breathing. GI: Abdomen is soft, non tender, distended, no abdominal masses. Liver and spleen not palpable.  No hernias appreciated.  Stool sample for occult testing is not indicated.   GU: No CVA tenderness.  No bladder fullness or masses.   Skin: No rashes, bruises or suspicious lesions. Lymph: No cervical or inguinal adenopathy. Neurologic: Grossly intact, no focal deficits, moving all 4 extremities. Psychiatric: Normal mood and affect.   Laboratory Data: Lab Results  Component Value Date   WBC 8.1 06/16/2017   HGB 13.8 06/16/2017   HCT 39.9 (L) 06/16/2017   MCV 118.9 (H) 06/16/2017   PLT 102 (L) 06/16/2017    Lab Results  Component Value Date    CREATININE 1.43 (H) 06/16/2017     Lab Results  Component Value Date   HGBA1C  10/14/2009    5.4 (NOTE)                                                                       According to the ADA Clinical Practice Recommendations for  2011, when HbA1c is used as a screening test:   >=6.5%   Diagnostic of Diabetes Mellitus           (if abnormal result  is confirmed)  5.7-6.4%   Increased risk of developing Diabetes Mellitus  References:Diagnosis and Classification of Diabetes Mellitus,Diabetes YYQM,2500,37(CWUGQ 1):S62-S69 and Standards of Medical Care in         Diabetes - 2011,Diabetes BVQX,4503,88  (Suppl 1):S11-S61.      Pertinent Imaging: CT abdomen pelvis from 05/02/2017.  No significant prostamegaly or GU pathology.  Sequela of cirrhosis appreciated.  Assessment & Plan:    1. Urinary retention Urinary retention, likely multifactorial in the setting of acute medical illness, sepsis, and underlying BPH Flomax discontinued at this time   Plan for recheck in 3 month to ensure he is voiding spontaneously  Return in about 3 months (around 09/17/2017) for IPSS and PVR.  Zara Council, PA-C  [facility] [address] [place], [place] [address] [phone]

## 2017-06-16 NOTE — ED Notes (Signed)
Patient reports no complaints at this time.

## 2017-06-16 NOTE — ED Notes (Signed)
Paracentesis site is dry, glue is intact,  no drainage or redness noted.

## 2017-06-17 ENCOUNTER — Other Ambulatory Visit: Payer: Self-pay

## 2017-06-17 ENCOUNTER — Telehealth: Payer: Self-pay

## 2017-06-17 ENCOUNTER — Ambulatory Visit (INDEPENDENT_AMBULATORY_CARE_PROVIDER_SITE_OTHER): Payer: Medicare HMO | Admitting: Urology

## 2017-06-17 ENCOUNTER — Encounter: Payer: Self-pay | Admitting: Urology

## 2017-06-17 VITALS — BP 97/53 | HR 77 | Ht 72.0 in | Wt 181.3 lb

## 2017-06-17 DIAGNOSIS — Z87898 Personal history of other specified conditions: Secondary | ICD-10-CM | POA: Diagnosis not present

## 2017-06-17 LAB — MEASURE POST VOID RESIDUAL: Scan Result: 999

## 2017-06-17 LAB — PATHOLOGIST SMEAR REVIEW

## 2017-06-17 NOTE — Telephone Encounter (Signed)
Spoke to patient's spouse concerning lab results. She states [facility] is treating him for the C-Diff. He's taking vancomycin. Labs were taken again. They will follow-up with Jefm Bryant concerning the C-diff. Diarrhea has resolved.

## 2017-06-18 ENCOUNTER — Other Ambulatory Visit: Payer: Self-pay | Admitting: Gastroenterology

## 2017-06-18 ENCOUNTER — Telehealth: Payer: Self-pay | Admitting: *Deleted

## 2017-06-18 ENCOUNTER — Other Ambulatory Visit: Payer: Self-pay

## 2017-06-18 ENCOUNTER — Encounter: Payer: Self-pay | Admitting: Gastroenterology

## 2017-06-18 ENCOUNTER — Ambulatory Visit: Payer: Medicare HMO | Admitting: Gastroenterology

## 2017-06-18 VITALS — BP 119/65 | HR 74 | Temp 97.8°F | Ht 72.0 in | Wt 180.8 lb

## 2017-06-18 DIAGNOSIS — I8500 Esophageal varices without bleeding: Secondary | ICD-10-CM

## 2017-06-18 DIAGNOSIS — R9389 Abnormal findings on diagnostic imaging of other specified body structures: Secondary | ICD-10-CM | POA: Diagnosis not present

## 2017-06-18 DIAGNOSIS — R945 Abnormal results of liver function studies: Secondary | ICD-10-CM

## 2017-06-18 DIAGNOSIS — R066 Hiccough: Secondary | ICD-10-CM

## 2017-06-18 DIAGNOSIS — R7989 Other specified abnormal findings of blood chemistry: Secondary | ICD-10-CM

## 2017-06-18 DIAGNOSIS — K7031 Alcoholic cirrhosis of liver with ascites: Secondary | ICD-10-CM

## 2017-06-18 DIAGNOSIS — I864 Gastric varices: Principal | ICD-10-CM

## 2017-06-18 NOTE — Progress Notes (Signed)
Jonathon Bellows MD, MRCP(U.K) [address]  [place]  [place], [place] [address]  Main: [phone]  Fax: [phone]   Primary Care Physician: Baxter Hire, MD  Primary Gastroenterologist:  Dr. Jonathon Bellows   No chief complaint on file.   HPI: KEN[place] is a 83 y.o. male  Summary of history :  He was admitted in early may with E coli sepsis, AKI. Commenced on [place] for alcoholic hepatitis. Stopped all alcohol. Returned to my office 06/03/17 with  diarrhea for 2 weeks, 5 bowel movements a day , no blood. Recalls last colonoscopy some years back cant recall.   Labs 05/30/17- Hb 13.2 grams. Cr 1.49  Hep C negative, Hepatitis B ag-negative.  Interval history   5//2019-  06/03/2017 Seen at the ER 3 days back with cough and diarrhea.  Stool tests - c diff positive .   Labs 06/2017 : HB 13.8 grams , Cr 1.43, Not immune to hep A,INR 1.40  ANA negative, Iron % saturation 79 , Factin, AMA,celiac  Negative,ferritin 868, T bil 6.4   06/16/17- had paracentresis- 2 Litres taken out . Stopped all alcohol in 04/2017 . No salt in diet   RUQ USG showed hyperechoic structure in rt lobe of his liver .  Taken off lasix and aldactone in 05/2017 due to worsening of kidney functions.   C/o of a lot of hiccups.   Diarrhea resolved    Current Outpatient Medications  Medication Sig Dispense Refill  . albuterol (PROVENTIL) (2.5 MG/3ML) 0.083% nebulizer solution Take 3 mLs (2.5 mg total) by nebulization every 6 (six) hours as needed for wheezing or shortness of breath. 75 mL 12  . allopurinol (ZYLOPRIM) 300 MG tablet Take 300 mg by mouth daily.    . cholecalciferol (VITAMIN D) 1000 UNITS tablet Take 1,000 Units by mouth daily.    . ferrous sulfate (FERROUSUL) 325 (65 FE) MG tablet Take 325 mg by mouth daily with breakfast.    . folic acid (FOLVITE) 1 MG tablet Take 1 tablet (1 mg total) by mouth daily.    Marland Kitchen levothyroxine (SYNTHROID, LEVOTHROID) 75 MCG tablet Take 75 mcg by mouth daily  before breakfast.    . midodrine (PROAMATINE) 10 MG tablet Take 1 tablet (10 mg total) by mouth 3 (three) times daily with meals. 90 tablet 2  . mometasone-formoterol (DULERA) 100-5 MCG/ACT AERO Inhale 2 puffs into the lungs 2 (two) times daily.    . tamsulosin (FLOMAX) 0.4 MG CAPS capsule Take 1 capsule (0.4 mg total) by mouth daily. 30 capsule   . thiamine 100 MG tablet Take 1 tablet (100 mg total) by mouth daily.    . vancomycin (VANCOCIN) 125 MG capsule TAKE 1 CAPSULE (125 MG TOTAL) BY MOUTH 4 (FOUR) TIMES DAILY  0  . vancomycin (VANCOCIN) 125 MG capsule Take by mouth.     No current facility-administered medications for this visit.     Allergies as of 06/18/2017  . (No Known Allergies)    ROS:  General: Negative for anorexia, weight loss, fever, chills, fatigue, weakness. ENT: Negative for hoarseness, difficulty swallowing , nasal congestion. CV: Negative for chest pain, angina, palpitations, dyspnea on exertion, peripheral edema.  Respiratory: Negative for dyspnea at rest, dyspnea on exertion, cough, sputum, wheezing.  GI: See history of present illness. GU:  Negative for dysuria, hematuria, urinary incontinence, urinary frequency, nocturnal urination.  Endo: Negative for unusual weight change.    Physical Examination:   There were no vitals taken for  this visit.  General: sitting in wheel chair  Eyes: No icterus. Conjunctivae pink. Mouth: Oropharyngeal mucosa moist and pink , no lesions erythema or exudate. Lungs: Clear to auscultation bilaterally. Non-labored. Heart: Regular rate and rhythm, no murmurs rubs or gallops.  Abdomen: Bowel sounds are normal, nontender, nondistended, no hepatosplenomegaly or masses, no abdominal bruits or hernia , no rebound or guarding.   Extremities: No lower extremity edema. No clubbing or deformities. Neuro: Alert and oriented x 3.  Grossly intact. Skin: Warm and dry, no jaundice.   Psych: Alert and cooperative, normal mood and  affect.   Imaging Studies: Dg Chest 2 View  Result Date: 06/16/2017 CLINICAL DATA:  Shortness of breath, fluid overload EXAM: CHEST - 2 VIEW COMPARISON:  05/30/2017 FINDINGS: Heart is upper limits normal in size. Linear densities in the right upper lobe and lung bases, likely atelectasis. No overt edema. No effusions or acute bony abnormality. IMPRESSION: Heart upper limits normal in size. Areas of atelectasis in the lungs bilaterally. Electronically Signed   By: Rolm Baptise M.D.   On: 06/16/2017 14:55   Dg Chest 2 View  Result Date: 05/30/2017 CLINICAL DATA:  Cough.  Chest pain. EXAM: CHEST - 2 VIEW COMPARISON:  Radiographs of May 02, 2017. FINDINGS: Stable cardiomediastinal silhouette. Both lungs are clear. No pneumothorax or pleural effusion is noted. The visualized skeletal structures are unremarkable. IMPRESSION: No active cardiopulmonary disease. Electronically Signed   By: Marijo Conception, M.D.   On: 05/30/2017 15:02   US Paracentesis  Result Date: 06/10/2017 CLINICAL DATA:  Cirrhosis, recurrent ascites EXAM: ULTRASOUND GUIDED PARACENTESIS TECHNIQUE: The procedure, risks (including but not limited to bleeding, infection, organ damage ), benefits, and alternatives were explained to the patient and family. Questions regarding the procedure were encouraged and answered. The family understands and consents to the procedure. Survey ultrasound of the abdomen was performed and an appropriate skin entry site in the right lower abdomen was selected. Skin site was marked, prepped with chlorhexadine, draped in usual sterile fashion, and infiltrated locally with 1% lidocaine. A Safe-T-Centesis needle was advanced into the peritoneal space until fluid could be aspirated. The sheath was advanced and the needle removed. 7.5 L of clear yellow ascites were aspirated. The patient tolerated the procedure well. COMPLICATIONS: COMPLICATIONS none IMPRESSION: Technically successful ultrasound guided paracentesis,  removing 7.5 L ascites. Electronically Signed   By: Lucrezia Europe M.D.   On: 06/10/2017 10:54   US Abdomen Limited Ruq  Result Date: 06/16/2017 CLINICAL DATA:  Ascites.  Alcoholic hepatitis EXAM: ULTRASOUND ABDOMEN LIMITED RIGHT UPPER QUADRANT COMPARISON:  06/10/2017 FINDINGS: Gallbladder: Gallstones are identified. There is diffuse gallbladder wall thickening which measures up to 3.5 mm. Nonspecific in the setting of cirrhosis with large volume ascites. Negative sonographic Murphy's sign. Common bile duct: Diameter: 4.5 mm Liver: The liver is diffusely cirrhotic. There is a hyperechoic structure within the right lobe of liver measuring 3.9 cm in maximum diameter. Portal vein is patent on color Doppler imaging with normal direction of blood flow towards the liver. Other: Large volume of ascites noted. IMPRESSION: 1. Cirrhosis. 2. Gallstones. 3. Focal hyperechoic structure in right lobe of liver noted. In this patient who is at increased risk for hepatoma suggest correlation with AFP levels and further imaging with liver protocol MRI with contrast material. Electronically Signed   By: Kerby Moors M.D.   On: 06/16/2017 17:37    Assessment and Plan:   DEMETRIS [place] is a 83 y.o. y/o male here to follow  up from his discahrge when he was admitted with alcoholic hepatitis, e coli sepsis and ewas treated with antibiotics. MELD 22 . Elevated ferritin and iron saturation . Present issues  1. Ascites - not controlled on low salt diet - unable to add on diuretics due to kidney functions - would benefit from TIPS 2. Hiccups - ?from GERD 3. Abnormal spot on the liver seen on RUQ USG- needs evaluation  4. Stopped all alcohol recently in 04/2017    Plan  1.  Calculate MELD score and decide on transplant referral once he is off all alcohol for 6 months.  2. Low salt diet  3.. Hep A vaccine , check HFE gene mutation  4. MRI liver to evaluate abnormal area seen ?will discuss with radiology if contrast can be used  due to renal function.  5. Zantac BID for hiccups 6. EGD to screen for varices.  7. Refer to [facility] to evaluate for TIPS  I have discussed alternative options, risks & benefits,  which include, but are not limited to, bleeding, infection, perforation,respiratory complication & drug reaction.  The patient agrees with this plan & written consent will be obtained.     Dr Jonathon Bellows  MD,MRCP [facility]) Follow up in 3-4 weeks

## 2017-06-18 NOTE — Telephone Encounter (Signed)
Lmom for daughter on hm and wk to sch Tips consult for Mr. Gurr./vm

## 2017-06-19 ENCOUNTER — Emergency Department
Admission: EM | Admit: 2017-06-19 | Discharge: 2017-06-19 | Disposition: A | Discharge status: Home / Self Care | Payer: Medicare HMO | Attending: Emergency Medicine | Admitting: Emergency Medicine

## 2017-06-19 ENCOUNTER — Other Ambulatory Visit: Payer: Self-pay

## 2017-06-19 DIAGNOSIS — E039 Hypothyroidism, unspecified: Secondary | ICD-10-CM | POA: Insufficient documentation

## 2017-06-19 DIAGNOSIS — T8189XA Other complications of procedures, not elsewhere classified, initial encounter: Secondary | ICD-10-CM | POA: Diagnosis present

## 2017-06-19 DIAGNOSIS — I129 Hypertensive chronic kidney disease with stage 1 through stage 4 chronic kidney disease, or unspecified chronic kidney disease: Secondary | ICD-10-CM | POA: Diagnosis not present

## 2017-06-19 DIAGNOSIS — K7031 Alcoholic cirrhosis of liver with ascites: Secondary | ICD-10-CM | POA: Diagnosis not present

## 2017-06-19 DIAGNOSIS — N189 Chronic kidney disease, unspecified: Secondary | ICD-10-CM | POA: Diagnosis not present

## 2017-06-19 DIAGNOSIS — Z9889 Other specified postprocedural states: Secondary | ICD-10-CM

## 2017-06-19 DIAGNOSIS — Z79899 Other long term (current) drug therapy: Secondary | ICD-10-CM | POA: Insufficient documentation

## 2017-06-19 DIAGNOSIS — Y658 Other specified misadventures during surgical and medical care: Secondary | ICD-10-CM | POA: Insufficient documentation

## 2017-06-19 DIAGNOSIS — S30811A Abrasion of abdominal wall, initial encounter: Secondary | ICD-10-CM

## 2017-06-19 MED ORDER — BACITRACIN-NEOMYCIN-POLYMYXIN 400-5-5000 EX OINT
TOPICAL_OINTMENT | Freq: Once | CUTANEOUS | Status: AC
Start: 2017-06-19 — End: 2017-06-19
  Administered 2017-06-19: 1 via TOPICAL
  Filled 2017-06-19: qty 1

## 2017-06-19 NOTE — Discharge Instructions (Signed)
Continue to apply Neosporin, bacitracin, or any triple antibiotic cream over the paracentesis site every 2 hours to protect the overlying skin, and create a natural barrier for the leakage.  Continue to change your dressings when they are wet to prevent skin breakdown.  Make an appointment with your primary care physician or with your GI doctor for Friday for reevaluation.  Return to the emergency department if you develop severe pain, fever, increasing redness, warmth or pain at the paracentesis site, or any other symptoms concerning to you.

## 2017-06-19 NOTE — ED Notes (Signed)
Spoke with Dr. Mariea Clonts and Dr. Lura Em orders placed at this time.

## 2017-06-19 NOTE — ED Provider Notes (Signed)
[facility] Emergency Department Provider Note  ____________________________________________  Time seen: Approximately 1:13 PM  I have reviewed the triage vital signs and the nursing notes.   HISTORY  Chief Complaint Post-op Problem    HPI [place] is a 83 y.o. male history of alcoholic cirrhosis presenting for leakage from the paracentesis site.  The patient was seen here 4 days ago and underwent therapeutic and diagnostic paracentesis for increasing abdominal ascites.  His work-up in the emergency department was reassuring, and he was discharged home.  Since then, the paracentesis site has continued to leak fluid, and his wife reports a significant amount of fluid leakage last night.  He also has some mild skin irritation over the paracentesis site.  He has not been having any abdominal pain, fevers or chills.  The patient has baseline decreased appetite, and difficulty with early satiety, anorexia; he is currently being evaluated for TIPS procedure.  Past Medical History:  Diagnosis Date  . Anemia   . Chronic kidney disease   . Hypertension   . Hypothyroidism, unspecified 06/28/2013  . MDS (myelodysplastic syndrome) ([place]) 08/02/2015    Patient Active Problem List   Diagnosis Date Noted  . Sepsis ([place]) 05/02/2017  . Hyperglycemia, unspecified 09/15/2015  . MDS (myelodysplastic syndrome) ([place]) 08/02/2015  . Essential hypertension 04/04/2015  . CRI (chronic renal insufficiency), stage 1 03/15/2015  . Palpitations 11/10/2013  . Hypothyroidism, unspecified 06/28/2013  . Gout 06/28/2013  . 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 Atrial fibrillation with RVR ([place]) 05/13/2013  . Diarrhea 05/13/2013  . Esophageal varices in cirrhosis ([place]) 05/06/2013  . Anemia 05/06/2013  . UGIB (upper gastrointestinal bleed) 05/06/2013  . Hemorrhagic shock ([place]) 05/06/2013  . Alcohol withdrawal ([place]) 05/06/2013  . Acute renal failure ([place]) 05/06/2013  .  Alcoholic cirrhosis ([place]) 59/56/3875  . Coagulopathy ([place]) 05/06/2013  . Acute respiratory failure ([place]) 05/06/2013    Past Surgical History:  Procedure Laterality Date  . RADIOLOGY WITH ANESTHESIA N/A 05/07/2013   Procedure: RADIOLOGY WITH ANESTHESIA;  Surgeon: Jacqulynn Cadet, MD;  Location: [place];  Service: Radiology;  Laterality: N/A;    Current Outpatient Rx  . Order #: [id number] Class: No Print  . Order #: [id number] Class: Historical Med  . Order #: [id number] Class: Historical Med  . Order #: [id number] Class: Historical Med  . Order #: [id number] Class: No Print  . Order #: [id number] Class: Historical Med  . Order #: [id number] Class: Normal  . Order #: [id number] Class: No Print  . Order #: [id number] Class: No Print  . Order #: [id number] Class: No Print  . Order #: [id number] Class: Historical Med  . Order #: [id number] Class: Historical Med    Allergies Patient has no known allergies.  Family History  Family history unknown: Yes    Social History Social History   Tobacco Use  . Smoking status: Never Smoker  . Smokeless tobacco: Never Used  Substance Use Topics  . Alcohol use: Yes    Alcohol/week: 2.4 oz    Types: 4 Shots of liquor per week    Comment: 4 shots of Liquor a day  . Drug use: No    Review of Systems Constitutional: No fever/chills.  Lightheadedness or syncope.  No changes in mental status. ENT:  No congestion or rhinorrhea. Cardiovascular: Denies chest pain. Denies palpitations. Respiratory: Denies shortness of breath.  No cough. Gastrointestinal: No abdominal pain.  Positive abdominal distention with fluid.  Positive anorexia.  No nausea, no vomiting.  No diarrhea.  No constipation. Genitourinary: Negative for dysuria.  Musculoskeletal: Negative for back pain. Skin: Negative for rash.  Positive for leakage from prior paracentesis site.  Positive for skin abrasion over paracentesis site in the right lower quadrant. Neurological: Negative for headaches. No  focal numbness, tingling or weakness.     ____________________________________________   PHYSICAL EXAM:  VITAL SIGNS: ED Triage Vitals  Enc Vitals Group     BP 06/19/17 1108 116/62     Pulse Rate 06/19/17 1108 85     Resp 06/19/17 1108 16     Temp 06/19/17 1108 98.1 F (36.7 C)     Temp Source 06/19/17 1108 Oral     SpO2 06/19/17 1108 98 %     Weight 06/19/17 1109 180 lb (81.6 kg)     Height 06/19/17 1109 6' (1.829 m)     Head Circumference --      Peak Flow --      Pain Score 06/19/17 1109 0     Pain Loc --      Pain Edu? --      Excl. in [place]? --     Constitutional: Alert and oriented.Answers questions appropriately.  Chronically ill-appearing Eyes: Conjunctivae are normal.  EOMI. positive scleral icterus. Head: Atraumatic. Nose: No congestion/rhinnorhea. Mouth/Throat: Mucous membranes are mildly dry.  Neck: No stridor.  Supple.  No JVD.  No meningismus. Cardiovascular: Normal rate Respiratory: Normal respiratory effort. Gastrointestinal: Soft, with diffuse distention and fluid wave.  The patient has no tenderness to palpation in the abdomen.  In the right lower quadrant, he has a small skin abrasion that is approximately 1 x 1 cm with an overlying scab around the paracentesis site that has a very mild fluid leak.  No guarding or rebound.  No peritoneal signs. Musculoskeletal: No LE edema.  Neurologic:  A&Ox3.  Speech is clear.  Face and smile are symmetric.  EOMI.  Moves all extremities well. Skin:  Skin is warm, dry. See above for skin exam.   Psychiatric: Mood and affect are normal.   ____________________________________________   LABS (all labs ordered are listed, but only abnormal results are displayed)  Labs Reviewed - No data to display ____________________________________________  EKG  Not indicated ____________________________________________  RADIOLOGY  No results found.  ____________________________________________   PROCEDURES  Procedure(s)  performed: None  Procedures  Critical Care performed: No ____________________________________________   INITIAL IMPRESSION / ASSESSMENT AND PLAN / ED COURSE  Pertinent labs & imaging results that were available during my care of the patient were reviewed by me and considered in my medical decision making (see chart for details).  83 y.o. male with fluid leakage from a recent paracentesis site also with a small abrasion at the paracentesis site.  Overall, the patient is hemodynamically stable.  He has no other acute complaints although he does have many chronic abnormalities.  I have talked to the family about management of the fluid leak and recommended using Neosporin or bacitracin as a barrier every 2 hours, and continuing to change his overlying gauze when wet to prevent further skin breakdown.  I have talked to the family about the risk of infection and red flag symptoms, but today, the patient has no evidence for SBP.  At this time, the patient is safe for discharge home.  Return precautions as well as follow-up instructions were discussed with the patient, his wife and his daughter.  ____________________________________________  FINAL CLINICAL IMPRESSION(S) / ED DIAGNOSES  Final diagnoses:  Status post abdominal paracentesis  Ascites due to alcoholic cirrhosis ([place])  Abrasion  of abdominal wall, initial encounter         NEW MEDICATIONS STARTED DURING THIS VISIT:  New Prescriptions   No medications on file      Eula Listen, MD 06/19/17 1318

## 2017-06-19 NOTE — ED Triage Notes (Signed)
Pt had paracentesis on Sunday in ER. States discharged home from ER. Fluid leaking from site that started last night. Pt alert and oriented X4, active, cooperative, pt in NAD. RR even and unlabored, color WNL.

## 2017-06-19 NOTE — ED Notes (Signed)
Bandage removed from RLQ, scant amount of pink tinged fluid on dressing.

## 2017-06-19 NOTE — ED Notes (Signed)
Informed RN that patient has been roomed and is ready for evaluation.  Patient in NAD at this time and call bell placed within reach.   

## 2017-06-25 ENCOUNTER — Encounter: Payer: Self-pay | Admitting: Emergency Medicine

## 2017-06-25 ENCOUNTER — Inpatient Hospital Stay: Payer: Medicare HMO

## 2017-06-25 ENCOUNTER — Other Ambulatory Visit: Payer: Self-pay

## 2017-06-25 ENCOUNTER — Inpatient Hospital Stay
Admission: EM | Admit: 2017-06-25 | Discharge: 2017-06-28 | DRG: 377 | Disposition: A | Discharge status: Home Health | Payer: Medicare HMO | Attending: Internal Medicine | Admitting: Internal Medicine

## 2017-06-25 ENCOUNTER — Encounter
Admission: EM | Disposition: A | Discharge status: Home Health | Payer: Self-pay | Source: Home / Self Care | Attending: Internal Medicine

## 2017-06-25 ENCOUNTER — Inpatient Hospital Stay: Payer: Medicare HMO | Admitting: Anesthesiology

## 2017-06-25 DIAGNOSIS — I864 Gastric varices: Secondary | ICD-10-CM | POA: Diagnosis present

## 2017-06-25 DIAGNOSIS — K7031 Alcoholic cirrhosis of liver with ascites: Secondary | ICD-10-CM | POA: Diagnosis present

## 2017-06-25 DIAGNOSIS — R188 Other ascites: Secondary | ICD-10-CM

## 2017-06-25 DIAGNOSIS — F1010 Alcohol abuse, uncomplicated: Secondary | ICD-10-CM | POA: Diagnosis present

## 2017-06-25 DIAGNOSIS — K922 Gastrointestinal hemorrhage, unspecified: Secondary | ICD-10-CM

## 2017-06-25 DIAGNOSIS — K210 Gastro-esophageal reflux disease with esophagitis: Secondary | ICD-10-CM | POA: Diagnosis present

## 2017-06-25 DIAGNOSIS — Z66 Do not resuscitate: Secondary | ICD-10-CM | POA: Diagnosis present

## 2017-06-25 DIAGNOSIS — I129 Hypertensive chronic kidney disease with stage 1 through stage 4 chronic kidney disease, or unspecified chronic kidney disease: Secondary | ICD-10-CM | POA: Diagnosis present

## 2017-06-25 DIAGNOSIS — E039 Hypothyroidism, unspecified: Secondary | ICD-10-CM | POA: Diagnosis present

## 2017-06-25 DIAGNOSIS — N183 Chronic kidney disease, stage 3 (moderate): Secondary | ICD-10-CM | POA: Diagnosis present

## 2017-06-25 DIAGNOSIS — D62 Acute posthemorrhagic anemia: Secondary | ICD-10-CM | POA: Diagnosis present

## 2017-06-25 DIAGNOSIS — K766 Portal hypertension: Secondary | ICD-10-CM | POA: Diagnosis present

## 2017-06-25 DIAGNOSIS — Z79899 Other long term (current) drug therapy: Secondary | ICD-10-CM | POA: Diagnosis not present

## 2017-06-25 DIAGNOSIS — D6959 Other secondary thrombocytopenia: Secondary | ICD-10-CM | POA: Diagnosis present

## 2017-06-25 DIAGNOSIS — I959 Hypotension, unspecified: Secondary | ICD-10-CM | POA: Diagnosis present

## 2017-06-25 DIAGNOSIS — Z79890 Hormone replacement therapy (postmenopausal): Secondary | ICD-10-CM | POA: Diagnosis not present

## 2017-06-25 DIAGNOSIS — D631 Anemia in chronic kidney disease: Secondary | ICD-10-CM | POA: Diagnosis present

## 2017-06-25 DIAGNOSIS — D469 Myelodysplastic syndrome, unspecified: Secondary | ICD-10-CM | POA: Diagnosis present

## 2017-06-25 DIAGNOSIS — I8510 Secondary esophageal varices without bleeding: Secondary | ICD-10-CM | POA: Diagnosis present

## 2017-06-25 DIAGNOSIS — I4891 Unspecified atrial fibrillation: Secondary | ICD-10-CM | POA: Diagnosis present

## 2017-06-25 DIAGNOSIS — J449 Chronic obstructive pulmonary disease, unspecified: Secondary | ICD-10-CM | POA: Diagnosis present

## 2017-06-25 DIAGNOSIS — K3189 Other diseases of stomach and duodenum: Secondary | ICD-10-CM | POA: Diagnosis present

## 2017-06-25 DIAGNOSIS — E43 Unspecified severe protein-calorie malnutrition: Secondary | ICD-10-CM

## 2017-06-25 DIAGNOSIS — K264 Chronic or unspecified duodenal ulcer with hemorrhage: Secondary | ICD-10-CM | POA: Diagnosis present

## 2017-06-25 DIAGNOSIS — M109 Gout, unspecified: Secondary | ICD-10-CM | POA: Diagnosis present

## 2017-06-25 HISTORY — PX: ESOPHAGOGASTRODUODENOSCOPY (EGD) WITH PROPOFOL: SHX5813

## 2017-06-25 LAB — COMPREHENSIVE METABOLIC PANEL
ALT: 32 U/L (ref 0–44)
ANION GAP: 6 (ref 5–15)
AST: 57 U/L — ABNORMAL HIGH (ref 15–41)
Albumin: 1.9 g/dL — ABNORMAL LOW (ref 3.5–5.0)
Alkaline Phosphatase: 174 U/L — ABNORMAL HIGH (ref 38–126)
BUN: 38 mg/dL — ABNORMAL HIGH (ref 8–23)
CHLORIDE: 104 mmol/L (ref 98–111)
CO2: 23 mmol/L (ref 22–32)
Calcium: 9.2 mg/dL (ref 8.9–10.3)
Creatinine, Ser: 1.76 mg/dL — ABNORMAL HIGH (ref 0.61–1.24)
GFR calc non Af Amer: 35 mL/min — ABNORMAL LOW (ref >60)
GFR, EST AFRICAN AMERICAN: 40 mL/min — AB (ref >60)
Glucose, Bld: 99 mg/dL (ref 70–99)
Potassium: 3.9 mmol/L (ref 3.5–5.1)
Sodium: 133 mmol/L — ABNORMAL LOW (ref 135–145)
Total Bilirubin: 3.9 mg/dL — ABNORMAL HIGH (ref 0.3–1.2)
Total Protein: 5.4 g/dL — ABNORMAL LOW (ref 6.5–8.1)

## 2017-06-25 LAB — CBC
HEMATOCRIT: 30.2 % — AB (ref 40.0–52.0)
HEMOGLOBIN: 10.3 g/dL — AB (ref 13.0–18.0)
MCH: 41.1 pg — AB (ref 26.0–34.0)
MCHC: 34.2 g/dL (ref 32.0–36.0)
MCV: 120.2 fL — AB (ref 80.0–100.0)
Platelets: 88 10*3/uL — ABNORMAL LOW (ref 150–440)
RBC: 2.51 MIL/uL — ABNORMAL LOW (ref 4.40–5.90)
RDW: 17 % — ABNORMAL HIGH (ref 11.5–14.5)
WBC: 5.6 10*3/uL (ref 3.8–10.6)

## 2017-06-25 LAB — LIPASE, BLOOD: Lipase: 41 U/L (ref 11–51)

## 2017-06-25 SURGERY — ESOPHAGOGASTRODUODENOSCOPY (EGD) WITH PROPOFOL
Anesthesia: General | Laterality: Left

## 2017-06-25 MED ORDER — SODIUM CHLORIDE 0.9 % IV BOLUS
1000.0000 mL | Freq: Once | INTRAVENOUS | Status: AC
  Administered 2017-06-25: 1000 mL via INTRAVENOUS

## 2017-06-25 MED ORDER — LIDOCAINE HCL (PF) 2 % IJ SOLN
INTRAMUSCULAR | Status: AC
  Filled 2017-06-25: qty 10

## 2017-06-25 MED ORDER — SODIUM CHLORIDE 0.9 % IV SOLN
INTRAVENOUS | Status: DC | PRN
  Administered 2017-06-25: 14:00:00 via INTRAVENOUS

## 2017-06-25 MED ORDER — SODIUM CHLORIDE 0.9 % IV SOLN
8.0000 mg/h | INTRAVENOUS | Status: DC
  Administered 2017-06-25 – 2017-06-27 (×5): 8 mg/h via INTRAVENOUS
  Filled 2017-06-25 (×6): qty 80

## 2017-06-25 MED ORDER — PROPOFOL 500 MG/50ML IV EMUL
INTRAVENOUS | Status: DC | PRN
  Administered 2017-06-25: 180 ug/kg/min via INTRAVENOUS

## 2017-06-25 MED ORDER — MOMETASONE FURO-FORMOTEROL FUM 100-5 MCG/ACT IN AERO
2.0000 | INHALATION_SPRAY | Freq: Two times a day (BID) | RESPIRATORY_TRACT | Status: DC
  Administered 2017-06-25 – 2017-06-27 (×5): 2 via RESPIRATORY_TRACT
  Filled 2017-06-25: qty 8.8

## 2017-06-25 MED ORDER — DOCUSATE SODIUM 100 MG PO CAPS: 100.0000 mg | ORAL_CAPSULE | Freq: Two times a day (BID) | ORAL | Status: DC | PRN

## 2017-06-25 MED ORDER — ALBUTEROL SULFATE (2.5 MG/3ML) 0.083% IN NEBU: 2.5000 mg | INHALATION_SOLUTION | Freq: Four times a day (QID) | RESPIRATORY_TRACT | Status: DC | PRN

## 2017-06-25 MED ORDER — PROPOFOL 500 MG/50ML IV EMUL
INTRAVENOUS | Status: AC
  Filled 2017-06-25: qty 50

## 2017-06-25 MED ORDER — SODIUM CHLORIDE 0.9 % IV SOLN
10.0000 mL/h | Freq: Once | INTRAVENOUS | Status: AC
  Administered 2017-06-25: 10 mL/h via INTRAVENOUS

## 2017-06-25 MED ORDER — PROPOFOL 10 MG/ML IV BOLUS
INTRAVENOUS | Status: DC | PRN
  Administered 2017-06-25: 20 mg via INTRAVENOUS
  Administered 2017-06-25: 30 mg via INTRAVENOUS

## 2017-06-25 MED ORDER — ALLOPURINOL 100 MG PO TABS
300.0000 mg | ORAL_TABLET | Freq: Every day | ORAL | Status: DC
  Administered 2017-06-26 – 2017-06-28 (×3): 300 mg via ORAL
  Filled 2017-06-25 (×4): qty 3

## 2017-06-25 MED ORDER — FOLIC ACID 1 MG PO TABS
1.0000 mg | ORAL_TABLET | Freq: Every day | ORAL | Status: DC
  Administered 2017-06-25 – 2017-06-28 (×4): 1 mg via ORAL
  Filled 2017-06-25 (×4): qty 1

## 2017-06-25 MED ORDER — FERROUS SULFATE 325 (65 FE) MG PO TABS
325.0000 mg | ORAL_TABLET | Freq: Every day | ORAL | Status: DC
  Administered 2017-06-26 – 2017-06-28 (×3): 325 mg via ORAL
  Filled 2017-06-25 (×3): qty 1

## 2017-06-25 MED ORDER — VITAMIN D 1000 UNITS PO TABS
1000.0000 [IU] | ORAL_TABLET | Freq: Every day | ORAL | Status: DC
  Administered 2017-06-25 – 2017-06-28 (×4): 1000 [IU] via ORAL
  Filled 2017-06-25 (×4): qty 1

## 2017-06-25 MED ORDER — PANTOPRAZOLE SODIUM 40 MG IV SOLR
80.0000 mg | Freq: Once | INTRAVENOUS | Status: AC
  Administered 2017-06-25: 80 mg via INTRAVENOUS
  Filled 2017-06-25: qty 80

## 2017-06-25 MED ORDER — OCTREOTIDE LOAD VIA INFUSION
50.0000 ug | Freq: Once | INTRAVENOUS | Status: AC
  Administered 2017-06-25: 50 ug via INTRAVENOUS
  Filled 2017-06-25: qty 25

## 2017-06-25 MED ORDER — SODIUM CHLORIDE 0.9 % IV SOLN
50.0000 ug/h | INTRAVENOUS | Status: DC
  Administered 2017-06-25 – 2017-06-28 (×8): 50 ug/h via INTRAVENOUS
  Filled 2017-06-25 (×17): qty 1

## 2017-06-25 MED ORDER — LEVOTHYROXINE SODIUM 75 MCG PO TABS
75.0000 ug | ORAL_TABLET | Freq: Every day | ORAL | Status: DC
  Administered 2017-06-26 – 2017-06-28 (×3): 75 ug via ORAL
  Filled 2017-06-25 (×3): qty 1

## 2017-06-25 MED ORDER — FENTANYL CITRATE (PF) 100 MCG/2ML IJ SOLN
INTRAMUSCULAR | Status: AC
  Filled 2017-06-25: qty 2

## 2017-06-25 MED ORDER — LIDOCAINE HCL (CARDIAC) PF 100 MG/5ML IV SOSY
PREFILLED_SYRINGE | INTRAVENOUS | Status: DC | PRN
  Administered 2017-06-25: 50 mg via INTRAVENOUS

## 2017-06-25 MED ORDER — MIDODRINE HCL 5 MG PO TABS
10.0000 mg | ORAL_TABLET | Freq: Three times a day (TID) | ORAL | Status: DC
  Administered 2017-06-25 – 2017-06-28 (×9): 10 mg via ORAL
  Filled 2017-06-25 (×11): qty 2

## 2017-06-25 MED ORDER — TAMSULOSIN HCL 0.4 MG PO CAPS
0.4000 mg | ORAL_CAPSULE | Freq: Every day | ORAL | Status: DC
  Administered 2017-06-25 – 2017-06-28 (×4): 0.4 mg via ORAL
  Filled 2017-06-25 (×4): qty 1

## 2017-06-25 MED ORDER — VITAMIN B-1 100 MG PO TABS
100.0000 mg | ORAL_TABLET | Freq: Every day | ORAL | Status: DC
  Administered 2017-06-25 – 2017-06-28 (×4): 100 mg via ORAL
  Filled 2017-06-25 (×4): qty 1

## 2017-06-25 MED ORDER — ALBUMIN HUMAN 25 % IV SOLN
12.5000 g | Freq: Once | INTRAVENOUS | Status: AC
  Administered 2017-06-25: 12.5 g via INTRAVENOUS
  Filled 2017-06-25 (×2): qty 50

## 2017-06-25 NOTE — H&P (Signed)
[facility] [place] NAME: Joseph Hawkins    MR#:  [id number]  DATE OF BIRTH:  19-Jul-1934  DATE OF ADMISSION:  06/25/2017  PRIMARY CARE PHYSICIAN: Baxter Hire, MD   REQUESTING/REFERRING PHYSICIAN: [email or web address]  CHIEF COMPLAINT:   Chief Complaint  Patient presents with  . GI Bleeding    HISTORY OF PRESENT ILLNESS: Joseph Hawkins  is a 83 y.o. male with a known history of anemia, chronic kidney disease, hypertension, alcoholic liver cirrhosis, ascites tap done twice in recent past. Came to emergency room today with family after having episodes of dark-colored stool without any associated nausea or vomiting since today morning and feeling very weak and had an episode of fall. Pt have weakness.  Denies use of aspirin, OTC pain meds, BC powders.  PAST MEDICAL HISTORY:   Past Medical History:  Diagnosis Date  . Anemia   . Chronic kidney disease   . Hypertension   . Hypothyroidism, unspecified 06/28/2013  . MDS (myelodysplastic syndrome) ([place]) 08/02/2015    PAST SURGICAL HISTORY:  Past Surgical History:  Procedure Laterality Date  . RADIOLOGY WITH ANESTHESIA N/A 05/07/2013   Procedure: RADIOLOGY WITH ANESTHESIA;  Surgeon: Jacqulynn Cadet, MD;  Location: [facility];  Service: Radiology;  Laterality: N/A;    SOCIAL HISTORY:  Social History   Tobacco Use  . Smoking status: Never Smoker  . Smokeless tobacco: Never Used  Substance Use Topics  . Alcohol use: Yes    Alcohol/week: 2.4 oz    Types: 4 Shots of liquor per week    Comment: 4 shots of Liquor a day    FAMILY HISTORY:  Family History  Family history unknown: Yes    DRUG ALLERGIES: No Known Allergies  REVIEW OF SYSTEMS:   CONSTITUTIONAL: No fever,have fatigue or weakness.  EYES: No blurred or double vision.  EARS, NOSE, AND THROAT: No tinnitus or ear pain.  RESPIRATORY: No cough, shortness of breath, wheezing or hemoptysis.  CARDIOVASCULAR: No chest pain, orthopnea, edema.   GASTROINTESTINAL: No nausea, vomiting, diarrhea or abdominal pain.  GENITOURINARY: No dysuria, hematuria.  ENDOCRINE: No polyuria, nocturia,  HEMATOLOGY: No anemia, easy bruising or bleeding SKIN: No rash or lesion. MUSCULOSKELETAL: No joint pain or arthritis.   NEUROLOGIC: No tingling, numbness, weakness.  PSYCHIATRY: No anxiety or depression.   MEDICATIONS AT HOME:  Prior to Admission medications   Medication Sig Start Date End Date Taking? Authorizing Provider  allopurinol (ZYLOPRIM) 300 MG tablet Take 300 mg by mouth daily.   Yes [provider]  cholecalciferol (VITAMIN D) 1000 UNITS tablet Take 1,000 Units by mouth daily.   Yes [provider]  ferrous sulfate (FERROUSUL) 325 (65 FE) MG tablet Take 325 mg by mouth daily with breakfast.   Yes [provider]  levothyroxine (SYNTHROID, LEVOTHROID) 75 MCG tablet Take 75 mcg by mouth daily before breakfast.   Yes [provider]  midodrine (PROAMATINE) 10 MG tablet Take 1 tablet (10 mg total) by mouth 3 (three) times daily with meals. 05/07/17  Yes Dustin Flock, MD  thiamine 100 MG tablet Take 1 tablet (100 mg total) by mouth daily. 05/20/13  Yes Delfina Redwood, MD  albuterol (PROVENTIL) (2.5 MG/3ML) 0.083% nebulizer solution Take 3 mLs (2.5 mg total) by nebulization every 6 (six) hours as needed for wheezing or shortness of breath. 05/20/13   Delfina Redwood, MD  folic acid (FOLVITE) 1 MG tablet Take 1 tablet (1 mg total) by mouth daily. Patient not  taking: Reported on 06/25/2017 05/20/13   Delfina Redwood, MD  mometasone-formoterol [facility]) 100-5 MCG/ACT AERO Inhale 2 puffs into the lungs 2 (two) times daily. Patient not taking: Reported on 06/25/2017 05/20/13   Delfina Redwood, MD  tamsulosin (FLOMAX) 0.4 MG CAPS capsule Take 1 capsule (0.4 mg total) by mouth daily. Patient not taking: Reported on 06/25/2017 05/20/13   Delfina Redwood, MD      PHYSICAL EXAMINATION:   VITAL SIGNS:  Blood pressure 119/74, pulse 72, temperature (!) 97.1 F (36.2 C), temperature source Tympanic, resp. rate (!) 27, height 6' (1.829 m), weight 81.6 kg (180 lb), SpO2 100 %.  GENERAL:  83 y.o.-year-old patient lying in the bed with no acute distress.  EYES: Pupils equal, round, reactive to light and accommodation. No scleral icterus. Extraocular muscles intact.  HEENT: Head atraumatic, normocephalic. Oropharynx and nasopharynx clear.  NECK:  Supple, no jugular venous distention. No thyroid enlargement, no tenderness.  LUNGS: Normal breath sounds bilaterally, no wheezing, rales,rhonchi or crepitation. No use of accessory muscles of respiration.  CARDIOVASCULAR: S1, S2 normal. No murmurs, rubs, or gallops.  ABDOMEN: Soft, nontender, distended. Bowel sounds present. No organomegaly or mass.  EXTREMITIES: No pedal edema, cyanosis, or clubbing.  NEUROLOGIC: Cranial nerves II through XII are intact. Muscle strength 3-4/5 in all extremities. Sensation intact. Gait not checked.  PSYCHIATRIC: The patient is alert and oriented x 2.  SKIN: No obvious rash, lesion, or ulcer.   LABORATORY PANEL:   CBC Recent Labs  Lab 06/25/17 0751  WBC 5.6  HGB 10.3*  HCT 30.2*  PLT 88*  MCV 120.2*  MCH 41.1*  MCHC 34.2  RDW 17.0*   ------------------------------------------------------------------------------------------------------------------  Chemistries  Recent Labs  Lab 06/25/17 0751  NA 133*  K 3.9  CL 104  CO2 23  GLUCOSE 99  BUN 38*  CREATININE 1.76*  CALCIUM 9.2  AST 57*  ALT 32  ALKPHOS 174*  BILITOT 3.9*   ------------------------------------------------------------------------------------------------------------------ estimated creatinine clearance is 36.1 mL/min (A) (by C-G formula based on SCr of 1.76 mg/dL (H)). ------------------------------------------------------------------------------------------------------------------ No results for input(s): TSH, T4TOTAL, T3FREE,  THYROIDAB in the last 72 hours.  Invalid input(s): FREET3   Coagulation profile No results for input(s): INR, PROTIME in the last 168 hours. ------------------------------------------------------------------------------------------------------------------- No results for input(s): DDIMER in the last 72 hours. -------------------------------------------------------------------------------------------------------------------  Cardiac Enzymes No results for input(s): CKMB, TROPONINI, MYOGLOBIN in the last 168 hours.  Invalid input(s): CK ------------------------------------------------------------------------------------------------------------------ Invalid input(s): POCBNP  ---------------------------------------------------------------------------------------------------------------  Urinalysis    Component Value Date/Time   COLORURINE AMBER (A) 05/03/2017 0915   APPEARANCEUR HAZY (A) 05/03/2017 0915   APPEARANCEUR Clear 05/04/2013 0755   LABSPEC 1.036 (H) 05/03/2017 0915   LABSPEC 1.015 05/04/2013 0755   PHURINE 5.0 05/03/2017 0915   GLUCOSEU NEGATIVE 05/03/2017 0915   GLUCOSEU Negative 05/04/2013 0755   HGBUR MODERATE (A) 05/03/2017 0915   BILIRUBINUR SMALL (A) 05/03/2017 0915   BILIRUBINUR Negative 05/04/2013 0755   KETONESUR NEGATIVE 05/03/2017 0915   PROTEINUR NEGATIVE 05/03/2017 0915   UROBILINOGEN 1.0 10/15/2009 1126   NITRITE NEGATIVE 05/03/2017 0915   LEUKOCYTESUR MODERATE (A) 05/03/2017 0915   LEUKOCYTESUR Trace 05/04/2013 0755     RADIOLOGY: US Paracentesis  Result Date: 06/25/2017 INDICATION: 83 year old with recurrent ascites. EXAM: ULTRASOUND GUIDED PARACENTESIS MEDICATIONS: None. COMPLICATIONS: None immediate. PROCEDURE: Informed written consent was obtained from the patient and the patient's daughter after a discussion of the risks, benefits and alternatives to treatment. A timeout was performed prior to the initiation of the  procedure. Initial ultrasound  scanning demonstrates a large amount of ascites within the right lower abdominal quadrant. The right lower abdomen was prepped and draped in the usual sterile fashion. 1% lidocaine was used for local anesthesia. Following this, a 6 Fr Safe-T-Centesis catheter was introduced. An ultrasound image was saved for documentation purposes. The paracentesis was performed. The catheter was removed and a dressing was applied. The patient tolerated the procedure well without immediate post procedural complication. FINDINGS: A total of approximately 9.1 L of yellow fluid was removed. IMPRESSION: Successful ultrasound-guided paracentesis yielding 9.1 liters of peritoneal fluid. Electronically Signed   By: Markus Daft M.D.   On: 06/25/2017 14:42    EKG: Orders placed or performed during the hospital encounter of 06/16/17  . ED EKG within 10 minutes  . ED EKG within 10 minutes  . EKG    IMPRESSION AND PLAN:  * GI bleed   Likely upper GI secondary to liver cirrhosis   Continue octreotide and Protonix drip   GI consult   Monitor hemoglobin  * Acute blood loss anemia   Hb dropped compared to past week.   Monitor for now.  * Liver cirrhosis and ascites   IR guided tap today.  * hypothyroidism   Cont levothyroxine  * COPD, no exacerbation   Cont home inhalers.  All the records are reviewed and case discussed with ED provider. Management plans discussed with the patient, family and they are in agreement.  CODE STATUS: DNR    Code Status Orders  (From admission, onward)        Start     Ordered   06/25/17 1112  Do not attempt resuscitation (DNR)  Continuous    Question Answer Comment  In the event of cardiac or respiratory ARREST Do not call a "code blue"   In the event of cardiac or respiratory ARREST Do not perform Intubation, CPR, defibrillation or ACLS   In the event of cardiac or respiratory ARREST Use medication by any route, position, wound care, and other measures to relive pain and  suffering. May use oxygen, suction and manual treatment of airway obstruction as needed for comfort.      06/25/17 1111    Code Status History    Date Active Date Inactive Code Status Order ID Comments User Context   06/25/2017 1014 06/25/2017 1111 DNR [id number]  Vaughan Basta, MD ED   05/02/2017 1838 05/07/2017 1855 Full Code [id number]  Epifanio Lesches, MD ED   05/07/2013 1849 05/20/2013 1715 Full Code [id number]  Jacqulynn Cadet, MD Inpatient   05/06/2013 0109 05/07/2013 1849 Full Code [id number]  Germain Osgood, PA-C Inpatient    Advance Directive Documentation     Most Recent Value  Type of Advance Directive  Healthcare Power of Attorney  Pre-existing out of facility DNR order (yellow form or pink MOST form)  -  "MOST" Form in Place?  -     Talked to his wife and daughter in room.  TOTAL TIME TAKING CARE OF THIS PATIENT: 35 minutes.    Vaughan Basta M.D on 06/25/2017   Between 7am to 6pm - Pager - [phone]  After 6pm go to [email or web address] - password [email or web address] [facility]  Office  [phone]  CC: Primary care physician; Baxter Hire, MD   Note: This dictation was prepared with Dragon dictation along with smaller phrase technology. Any transcriptional errors that result from this process are unintentional.

## 2017-06-25 NOTE — ED Provider Notes (Signed)
[facility] Emergency Department Provider Note  Time seen: 7:49 AM  I have reviewed the triage vital signs and the nursing notes.   HISTORY  Chief Complaint GI Bleeding    HPI Joseph Hawkins is a 83 y.o. male with a past medical history of anemia, CKD, hypertension, upper GI bleed, esophageal and gastric varices history of alcohol use, presents to the emergency department for abdominal pain and GI bleed.  According to the patient he woke up this morning with abdominal discomfort, moderate diffuse pain across the entire abdomen with profuse black and bloody diarrhea.  Patient has a history of GI bleed previously.  Has known esophageal and gastric varices.  Denies any vomiting but does state some nausea.  Patient was brought to the emergency department as a critical patient with a blood pressure in the 80s per EMS.  Very large amount of melena output on arrival.   Past Medical History:  Diagnosis Date  . Anemia   . Chronic kidney disease   . Hypertension   . Hypothyroidism, unspecified 06/28/2013  . MDS (myelodysplastic syndrome) ([place]) 08/02/2015    Patient Active Problem List   Diagnosis Date Noted  . Sepsis ([place]) 05/02/2017  . Hyperglycemia, unspecified 09/15/2015  . MDS (myelodysplastic syndrome) ([place]) 08/02/2015  . Essential hypertension 04/04/2015  . CRI (chronic renal insufficiency), stage 1 03/15/2015  . Palpitations 11/10/2013  . Hypothyroidism, unspecified 06/28/2013  . Gout 06/28/2013  . Esophageal and gastric varices ([place]) 06/28/2013  . Atrial fibrillation with RVR ([place]) 05/13/2013  . Diarrhea 05/13/2013  . Esophageal varices in cirrhosis ([place]) 05/06/2013  . Anemia 05/06/2013  . UGIB (upper gastrointestinal bleed) 05/06/2013  . Hemorrhagic shock ([place]) 05/06/2013  . Alcohol withdrawal ([place]) 05/06/2013  . Acute renal failure ([place]) 05/06/2013  . Alcoholic cirrhosis ([place]) 01/03/7251  . Coagulopathy ([place]) 05/06/2013  . Acute respiratory failure  ([place]) 05/06/2013    Past Surgical History:  Procedure Laterality Date  . RADIOLOGY WITH ANESTHESIA N/A 05/07/2013   Procedure: RADIOLOGY WITH ANESTHESIA;  Surgeon: Jacqulynn Cadet, MD;  Location: [place];  Service: Radiology;  Laterality: N/A;    Prior to Admission medications   Medication Sig Start Date End Date Taking? Authorizing Provider  albuterol (PROVENTIL) (2.5 MG/3ML) 0.083% nebulizer solution Take 3 mLs (2.5 mg total) by nebulization every 6 (six) hours as needed for wheezing or shortness of breath. 05/20/13   Delfina Redwood, MD  allopurinol (ZYLOPRIM) 300 MG tablet Take 300 mg by mouth daily.    [provider]  cholecalciferol (VITAMIN D) 1000 UNITS tablet Take 1,000 Units by mouth daily.    [provider]  ferrous sulfate (FERROUSUL) 325 (65 FE) MG tablet Take 325 mg by mouth daily with breakfast.    [provider]  folic acid (FOLVITE) 1 MG tablet Take 1 tablet (1 mg total) by mouth daily. 05/20/13   Delfina Redwood, MD  levothyroxine (SYNTHROID, LEVOTHROID) 75 MCG tablet Take 75 mcg by mouth daily before breakfast.    [provider]  midodrine (PROAMATINE) 10 MG tablet Take 1 tablet (10 mg total) by mouth 3 (three) times daily with meals. 05/07/17   Dustin Flock, MD  mometasone-formoterol [facility]) 100-5 MCG/ACT AERO Inhale 2 puffs into the lungs 2 (two) times daily. 05/20/13   Delfina Redwood, MD  tamsulosin (FLOMAX) 0.4 MG CAPS capsule Take 1 capsule (0.4 mg total) by mouth daily. 05/20/13   Delfina Redwood, MD  thiamine 100 MG tablet Take 1 tablet (100  mg total) by mouth daily. 05/20/13   Delfina Redwood, MD  vancomycin (VANCOCIN) 125 MG capsule TAKE 1 CAPSULE (125 MG TOTAL) BY MOUTH 4 (FOUR) TIMES DAILY 06/07/17   [provider]    No Known Allergies  Family History  Family history unknown: Yes    Social History Social History   Tobacco Use  . Smoking status: Never Smoker  . Smokeless tobacco: Never Used   Substance Use Topics  . Alcohol use: Yes    Alcohol/week: 2.4 oz    Types: 4 Shots of liquor per week    Comment: 4 shots of Liquor a day  . Drug use: No    Review of Systems Constitutional: Negative for fever. Cardiovascular: Negative for chest pain. Respiratory: Negative for shortness of breath. Gastrointestinal: Moderate diffuse abdominal discomfort.  Negative for vomiting, positive for nausea, positive for profuse dark stool. Genitourinary: Negative for urinary compaints Musculoskeletal: Negative for musculoskeletal complaints Neurological: Negative for headache All other ROS negative  ____________________________________________   PHYSICAL EXAM:  VITAL SIGNS: ED Triage Vitals  Enc Vitals Group     BP 06/25/17 0745 (!) 101/54     Pulse Rate 06/25/17 0745 71     Resp 06/25/17 0745 16     Temp 06/25/17 0745 97.6 F (36.4 C)     Temp Source 06/25/17 0745 Oral     SpO2 06/25/17 0745 99 %     Weight 06/25/17 0746 180 lb (81.6 kg)     Height 06/25/17 0746 6' (1.829 m)     Head Circumference --      Peak Flow --      Pain Score 06/25/17 0746 0     Pain Loc --      Pain Edu? --      Excl. in [place]? --     Constitutional: Alert and oriented.  Mild distress, Trendelenburg positioning, hypotensive with profuse melena output incontinence Eyes: Normal exam ENT   Head: Normocephalic and atraumatic.   Mouth/Throat: Mucous membranes are moist. Cardiovascular: Normal rate, regular rhythm.  Respiratory: Normal respiratory effort without tachypnea nor retractions. Breath sounds are clear  Gastrointestinal: Soft, moderate diffuse tenderness to palpation.  No obvious rebound or guarding. Musculoskeletal: Moves all extremities. Neurologic:  Normal speech and language. No gross focal neurologic deficits Skin:  Skin is warm, dry Psychiatric: Mood and affect are normal.  ____________________________________________   INITIAL IMPRESSION / ASSESSMENT AND PLAN / ED  COURSE  Pertinent labs & imaging results that were available during my care of the patient were reviewed by me and considered in my medical decision making (see chart for details).  Patient presents to the emergency department for moderate abdominal pain with significant output of dark stool/incontinence.  Differential would include upper GI bleed, lower GI bleed, diverticulitis, diverticulosis, ulcer, variceal bleed.  Given the patient's initial hypotension with EMS 80s over 17s with profuse melena incontinence in the emergency department I have started the patient on Protonix, octreotide, will IV hydrate.  I have ordered 2 units of emergency release blood.  Blood pressure currently 101/54.  Patient's H&H is currently 10/30, 3 points down from last week.  I discussed the patient with Dr. Allen Norris of GI medicine.  We will discuss with the hospitalist service for admission and further treatment.  Blood pressure currently 94/52, receiving his second unit of blood as well as IV fluids.  CRITICAL CARE Performed by: Harvest Dark   Total critical care time: 45 minutes  Critical care time was exclusive  of separately billable procedures and treating other patients.  Critical care was necessary to treat or prevent imminent or life-threatening deterioration.  Critical care was time spent personally by me on the following activities: development of treatment plan with patient and/or surrogate as well as nursing, discussions with consultants, evaluation of patient's response to treatment, examination of patient, obtaining history from patient or surrogate, ordering and performing treatments and interventions, ordering and review of laboratory studies, ordering and review of radiographic studies, pulse oximetry and re-evaluation of patient's condition.   ____________________________________________   FINAL CLINICAL IMPRESSION(S) / ED DIAGNOSES  GI bleed    Harvest Dark, MD 06/25/17 [phone]

## 2017-06-25 NOTE — Op Note (Signed)
[facility] Gastroenterology Patient Name: Joseph Hawkins Procedure Date: 06/25/2017 2:22 PM MRN: [id number] Account #: [id number] Date of Birth: 1934/01/12 Admit Type: Inpatient Age: 83 Room: [facility] ENDO ROOM 4 Gender: Male Note Status: Finalized Procedure:            Upper GI endoscopy Indications:          Acute post hemorrhagic anemia Providers:            [name] MD, MD Medicines:            Propofol per Anesthesia Complications:        No immediate complications. Procedure:            Pre-Anesthesia Assessment:                       - Prior to the procedure, a History and Physical was                        performed, and patient medications and allergies were                        reviewed. The patient's tolerance of previous                        anesthesia was also reviewed. The risks and benefits of                        the procedure and the sedation options and risks were                        discussed with the patient. All questions were                        answered, and informed consent was obtained. Prior                        Anticoagulants: The patient has taken no previous                        anticoagulant or antiplatelet agents. ASA Grade                        Assessment: II - A patient with mild systemic disease.                        After reviewing the risks and benefits, the patient was                        deemed in satisfactory condition to undergo the                        procedure.                       After obtaining informed consent, the endoscope was                        passed under direct vision. Throughout the procedure,                        the patient's blood  pressure, pulse, and oxygen                        saturations were monitored continuously. The Endoscope                        was introduced through the mouth, and advanced to the                        second part of duodenum. The upper GI endoscopy  was                        accomplished without difficulty. The patient tolerated                        the procedure well. Findings:      LA Grade D (one or more mucosal breaks involving at least 75% of       esophageal circumference) esophagitis with no bleeding was found in the       lower third of the esophagus.      Severe portal hypertensive gastropathy was found in the entire examined       stomach.      One non-bleeding cratered duodenal ulcer with adherent clot was found in       the duodenal bulb. The lesion was 10 mm in largest dimension. Impression:           - LA Grade D reflux esophagitis.                       - Portal hypertensive gastropathy.                       - One non-bleeding duodenal ulcer with adherent clot.                       - No specimens collected. Recommendation:       - Return patient to hospital ward for ongoing care.                       - Use a proton pump inhibitor PO BID.                       - Consult vascular surgery for treatment if any further                        bleeding. Procedure Code(s):    --- Professional ---                       [phone], Esophagogastroduodenoscopy, flexible, transoral;                        diagnostic, including collection of specimen(s) by                        brushing or washing, when performed (separate procedure) Diagnosis Code(s):    --- Professional ---                       D62, Acute posthemorrhagic anemia  K26.4, Chronic or unspecified duodenal ulcer with                        hemorrhage                       K21.0, Gastro-esophageal reflux disease with esophagitis                       K76.6, Portal hypertension CPT copyright 2017 American Medical Association. All rights reserved. The codes documented in this report are preliminary and upon coder review may  be revised to meet current compliance requirements. [name] MD, MD 06/25/2017 2:39:13 PM This report has been signed  electronically. Number of Addenda: 0 Note Initiated On: 06/25/2017 2:22 PM      [facility]

## 2017-06-25 NOTE — Procedures (Signed)
US guided paracentesis.  Removed 9.1 liters.  Minimal blood loss and no immediate complication.

## 2017-06-25 NOTE — Transfer of Care (Signed)
Immediate Anesthesia Transfer of Care Note  Patient: Joseph Hawkins  Procedure(s) Performed: ESOPHAGOGASTRODUODENOSCOPY (EGD) WITH PROPOFOL (Left )  Patient Location: PACU  Anesthesia Type:General  Level of Consciousness: sedated  Airway & Oxygen Therapy: Patient Spontanous Breathing and Patient connected to nasal cannula oxygen  Post-op Assessment: Report given to RN and Post -op Vital signs reviewed and stable  Post vital signs: Reviewed and stable  Last Vitals:  Vitals Value Taken Time  BP 104/60 06/25/2017  2:42 PM  Temp 36.2 C 06/25/2017  2:42 PM  Pulse 72 06/25/2017  2:44 PM  Resp 20 06/25/2017  2:44 PM  SpO2 100 % 06/25/2017  2:44 PM  Vitals shown include unvalidated device data.  Last Pain:  Vitals:   06/25/17 1442  TempSrc: Tympanic  PainSc: Asleep         Complications: No apparent anesthesia complications

## 2017-06-25 NOTE — Progress Notes (Signed)
Liver cirrhosis, recurrent ascites, tap. Worsening weakness, decreased appeatite. Came with dark stool and a fall due to weakness. Hb 10. Admit for GI bleed, GI consult. NPO, octreotide and protonix drip.

## 2017-06-25 NOTE — Consult Note (Signed)
Joseph Lame, MD [facility]  [address] [place], [place] [address] Phone: [phone] Fax : [phone]  Consultation  Referring Provider:     Dr. Anselm Jungling Primary Care Physician:  Baxter Hire, MD Primary Gastroenterologist:  Dr. Vicente Males         Reason for Consultation:     GI bleed  Date of Admission:  06/25/2017 Date of Consultation:  06/25/2017         HPI:   Joseph Hawkins is a 83 y.o. male who has a history of alcoholic liver disease with documented gastric and esophageal varices.  The patient had blood work on 16 June that showed his hemoglobin to be 13.8 but reports that he has been having black stools and his recheck of his hemoglobin was 10.3 today.  The patient also had tense ascites and was sent down to ultrasound and had 9 L taken off of his abdomen today.  The patient had presented with worsening weakness and black stools.  He was kept n.p.o. and started on an octreotide and Protonix drip.  The patient when asked states that he has any in the last few days.  The patient has also had recent diarrhea for which she was seen in the emergency room back in May and by Dr. Vicente Males at the beginning of June.  Despite the patient being in the hospital in May for alcoholic hepatitis he states that he has not drank for a year.  The patient was sent by Dr. Vicente Males for a TIPS procedure in [facility] and he has an appointment for either evaluation or for the procedure for tomorrow.   Past Medical History:  Diagnosis Date  . Anemia   . Chronic kidney disease   . Hypertension   . Hypothyroidism, unspecified 06/28/2013  . MDS (myelodysplastic syndrome) ([place]) 08/02/2015    Past Surgical History:  Procedure Laterality Date  . RADIOLOGY WITH ANESTHESIA N/A 05/07/2013   Procedure: RADIOLOGY WITH ANESTHESIA;  Surgeon: Jacqulynn Cadet, MD;  Location: [facility];  Service: Radiology;  Laterality: N/A;    Prior to Admission medications   Medication Sig Start Date End Date Taking? Authorizing Provider   allopurinol (ZYLOPRIM) 300 MG tablet Take 300 mg by mouth daily.   Yes [provider]  cholecalciferol (VITAMIN D) 1000 UNITS tablet Take 1,000 Units by mouth daily.   Yes [provider]  ferrous sulfate (FERROUSUL) 325 (65 FE) MG tablet Take 325 mg by mouth daily with breakfast.   Yes [provider]  levothyroxine (SYNTHROID, LEVOTHROID) 75 MCG tablet Take 75 mcg by mouth daily before breakfast.   Yes [provider]  midodrine (PROAMATINE) 10 MG tablet Take 1 tablet (10 mg total) by mouth 3 (three) times daily with meals. 05/07/17  Yes Dustin Flock, MD  thiamine 100 MG tablet Take 1 tablet (100 mg total) by mouth daily. 05/20/13  Yes Delfina Redwood, MD  albuterol (PROVENTIL) (2.5 MG/3ML) 0.083% nebulizer solution Take 3 mLs (2.5 mg total) by nebulization every 6 (six) hours as needed for wheezing or shortness of breath. 05/20/13   Delfina Redwood, MD  folic acid (FOLVITE) 1 MG tablet Take 1 tablet (1 mg total) by mouth daily. Patient not taking: Reported on 06/25/2017 05/20/13   Delfina Redwood, MD  mometasone-formoterol [facility]) 100-5 MCG/ACT AERO Inhale 2 puffs into the lungs 2 (two) times daily. Patient not taking: Reported on 06/25/2017 05/20/13   Delfina Redwood, MD  tamsulosin (FLOMAX) 0.4 MG CAPS capsule  Take 1 capsule (0.4 mg total) by mouth daily. Patient not taking: Reported on 06/25/2017 05/20/13   Delfina Redwood, MD    Family History  Family history unknown: Yes     Social History   Tobacco Use  . Smoking status: Never Smoker  . Smokeless tobacco: Never Used  Substance Use Topics  . Alcohol use: Yes    Alcohol/week: 2.4 oz    Types: 4 Shots of liquor per week    Comment: 4 shots of Liquor a day  . Drug use: No    Allergies as of 06/25/2017  . (No Known Allergies)    Review of Systems:    All systems reviewed and negative except where noted in HPI.   Physical Exam:  Vital signs in last 24 hours: Temp:  [97.4  F (36.3 C)-97.8 F (36.6 C)] 97.8 F (36.6 C) (06/25 1107) Pulse Rate:  [65-85] 85 (06/25 1305) Resp:  [14-25] 16 (06/25 1305) BP: (94-125)/(52-76) 106/63 (06/25 1305) SpO2:  [96 %-100 %] 99 % (06/25 1305) Weight:  [180 lb (81.6 kg)] 180 lb (81.6 kg) (06/25 0746) Last BM Date: 06/25/17 General:   Pleasant, cooperative in NAD Head:  Normocephalic and atraumatic. Eyes:   No icterus.   Conjunctiva pink. PERRLA. Ears:  Normal auditory acuity. Neck:  Supple; no masses or thyroidomegaly Lungs: Respirations even and unlabored. Lungs clear to auscultation bilaterally.   No wheezes, crackles, or rhonchi.  Heart:  Regular rate and rhythm;  Without murmur, clicks, rubs or gallops Abdomen:  Soft, nondistended, nontender. Normal bowel sounds. No appreciable masses or hepatomegaly.  No rebound or guarding.  Of note is the patient just had a paracentesis with 9 L removed. Rectal:  Not performed. Msk:  Symmetrical without gross deformities.    Extremities:  Without edema, cyanosis or clubbing. Neurologic:  Alert and oriented x3;  grossly normal neurologically. Skin:  Intact without significant lesions or rashes. Cervical Nodes:  No significant cervical adenopathy. Psych:  Alert and cooperative. Normal affect.  LAB RESULTS: Recent Labs    06/25/17 0751  WBC 5.6  HGB 10.3*  HCT 30.2*  PLT 88*   BMET Recent Labs    06/25/17 0751  NA 133*  K 3.9  CL 104  CO2 23  GLUCOSE 99  BUN 38*  CREATININE 1.76*  CALCIUM 9.2   LFT Recent Labs    06/25/17 0751  PROT 5.4*  ALBUMIN 1.9*  AST 57*  ALT 32  ALKPHOS 174*  BILITOT 3.9*   PT/INR No results for input(s): LABPROT, INR in the last 72 hours.  STUDIES: No results found.    Impression / Plan:   Assessment: Active Problems:   GI bleed   Joseph Hawkins is a 83 y.o. y/o male with a history of alcoholic hepatitis and alcohol abuse who is now admitted with a drop in hemoglobin and a history of esophageal varices and gastric  varices.  The patient had a paracentesis today with 9 L removed from his abdomen.  He also reports that he has had black stools.  Plan:  The patient has a history of cirrhosis and likely has a variceal bleed.  I would continue the octreotide.  The patient has already been started on this and a PPI.  The patient will be set up for an EGD for today.  I have discussed the plan with his wife and she agrees with the plan.  Thank you for involving me in the care of this patient.  LOS: 0 days   Joseph Lame, MD  06/25/2017, 1:52 PM    Note: This dictation was prepared with Dragon dictation along with smaller phrase technology. Any transcriptional errors that result from this process are unintentional.

## 2017-06-25 NOTE — ED Notes (Addendum)
1 unit of 320ML O POSITIVE Emergency blood administered with verification from this RN and RN Mickel Baas. Unit [id number] X EXPIRATION 07/13/2017

## 2017-06-25 NOTE — ED Notes (Signed)
3 unsuccessful attempts at 3rd IV access, 1 by this RN and 2 by [employer]

## 2017-06-25 NOTE — Anesthesia Post-op Follow-up Note (Signed)
Anesthesia QCDR form completed.        

## 2017-06-25 NOTE — ED Notes (Signed)
Consent for blood signed by pt 

## 2017-06-25 NOTE — ED Triage Notes (Signed)
Pt arrived via ems from home with complaints of gi bleed. Pt was seen 2 weeks prior for gi bleed.

## 2017-06-25 NOTE — Anesthesia Procedure Notes (Signed)
Date/Time: 06/25/2017 2:25 PM Performed by: Johnna Acosta, CRNA Pre-anesthesia Checklist: Patient identified, Emergency Drugs available, Suction available, Patient being monitored and Timeout performed Patient Re-evaluated:Patient Re-evaluated prior to induction Oxygen Delivery Method: Nasal cannula Preoxygenation: Pre-oxygenation with 100% oxygen

## 2017-06-25 NOTE — ED Notes (Signed)
Pt cleaned and repositioned.

## 2017-06-25 NOTE — ED Notes (Signed)
2nd unit of emergency blood completed.  

## 2017-06-25 NOTE — Progress Notes (Signed)
Family Meeting Note  Advance Directive:yes  Today a meeting took place with the Patient, spouse and daughter.  The following clinical team members were present during this meeting:MD  The following were discussed:Patient's diagnosis: liver cirrhosis, ascites, Gi bleed. , Patient's progosis: < 12 months and Goals for treatment: DNR  Additional follow-up to be provided: GI, IR  Time spent during discussion:20 minutes  Vaughan Basta, MD

## 2017-06-25 NOTE — ED Notes (Signed)
First unit of emergency blood complete

## 2017-06-25 NOTE — Anesthesia Preprocedure Evaluation (Addendum)
Anesthesia Evaluation  Patient identified by MRN, date of birth, ID band Patient awake and Patient confused    Reviewed: Allergy & Precautions, H&P , NPO status , Patient's Chart, lab work & pertinent test results  History of Anesthesia Complications Negative for: history of anesthetic complications  Airway Mallampati: III  TM Distance: <3 FB Neck ROM: limited    Dental  (+) Chipped, Poor Dentition, Missing   Pulmonary neg pulmonary ROS, neg shortness of breath,           Cardiovascular Exercise Tolerance: Good hypertension, (-) Past MI      Neuro/Psych PSYCHIATRIC DISORDERS negative neurological ROS     GI/Hepatic negative GI ROS, Neg liver ROS,   Endo/Other  Hypothyroidism   Renal/GU Renal disease     Musculoskeletal   Abdominal   Peds  Hematology negative hematology ROS (+)   Anesthesia Other Findings Patient is NPO appropriate and reports no nausea or vomiting today.   Past Medical History: No date: Anemia No date: Chronic kidney disease No date: Hypertension 06/28/2013: Hypothyroidism, unspecified 08/02/2015: MDS (myelodysplastic syndrome) ([facility])  Past Surgical History: 05/07/2013: RADIOLOGY WITH ANESTHESIA; N/A     Comment:  Procedure: RADIOLOGY WITH ANESTHESIA;  Surgeon: Jacqulynn Cadet, MD;  Location: [facility];  Service: Radiology;                Laterality: N/A;  BMI    Body Mass Index:  24.41 kg/m      Reproductive/Obstetrics negative OB ROS                           Anesthesia Physical Anesthesia Plan  ASA: IV  Anesthesia Plan: General   Post-op Pain Management:    Induction: Intravenous  PONV Risk Score and Plan: Propofol infusion and TIVA  Airway Management Planned: Natural Airway and Nasal Cannula  Additional Equipment:   Intra-op Plan:   Post-operative Plan:   Informed Consent: I have reviewed the patients History and Physical, chart, labs  and discussed the procedure including the risks, benefits and alternatives for the proposed anesthesia with the patient or authorized representative who has indicated his/her understanding and acceptance.   Dental Advisory Given  Plan Discussed with: Anesthesiologist, CRNA and Surgeon  Anesthesia Plan Comments: (Patient informed that they are higher risk for complications from anesthesia during this procedure due to their medical history.  Patient voiced understanding.  Plan to suspend DNR for procedure.  Patient and family voiced understanding.  Patient and daughter consented for risks of anesthesia including but not limited to:  - adverse reactions to medications - damage to teeth, lips or other oral mucosa - sore throat or hoarseness - Damage to heart, brain, lungs or loss of life  They voiced understanding.)      Anesthesia Quick Evaluation

## 2017-06-25 NOTE — Care Management (Signed)
Patient admitted from home with GI bleed.  Patient lives at home with wife.  PCP Edwina Barth.  Patient was set up with home health through [facility] in May of this year.  Message sent to [facility] with [facility] to determine if patient is still open.  Awaiting return call.  Patient was transfused while in ED.

## 2017-06-25 NOTE — ED Notes (Signed)
2nd unit of 370ml of emergency blood administered with verification from this RN and Archivist. Pt was given unit [id number] UNIT WAS O POSITIVE WITH EXPIRATION 07/13/2017

## 2017-06-25 NOTE — ED Notes (Signed)
PT's brief checked with no findings of continued bleeding. Brief dry and clean

## 2017-06-26 ENCOUNTER — Telehealth: Payer: Self-pay | Admitting: Gastroenterology

## 2017-06-26 ENCOUNTER — Other Ambulatory Visit: Payer: Medicare HMO

## 2017-06-26 DIAGNOSIS — E43 Unspecified severe protein-calorie malnutrition: Secondary | ICD-10-CM

## 2017-06-26 LAB — BASIC METABOLIC PANEL
Anion gap: 6 (ref 5–15)
BUN: 46 mg/dL — ABNORMAL HIGH (ref 8–23)
CHLORIDE: 106 mmol/L (ref 98–111)
CO2: 22 mmol/L (ref 22–32)
CREATININE: 1.68 mg/dL — AB (ref 0.61–1.24)
Calcium: 9.3 mg/dL (ref 8.9–10.3)
GFR calc Af Amer: 42 mL/min — ABNORMAL LOW (ref >60)
GFR calc non Af Amer: 37 mL/min — ABNORMAL LOW (ref >60)
GLUCOSE: 91 mg/dL (ref 70–99)
Potassium: 5 mmol/L (ref 3.5–5.1)
SODIUM: 134 mmol/L — AB (ref 135–145)

## 2017-06-26 LAB — CBC
HEMATOCRIT: 30.2 % — AB (ref 40.0–52.0)
Hemoglobin: 10.6 g/dL — ABNORMAL LOW (ref 13.0–18.0)
MCH: 38.4 pg — AB (ref 26.0–34.0)
MCHC: 35.2 g/dL (ref 32.0–36.0)
MCV: 109.1 fL — AB (ref 80.0–100.0)
PLATELETS: 61 10*3/uL — AB (ref 150–440)
RBC: 2.76 MIL/uL — ABNORMAL LOW (ref 4.40–5.90)
RDW: 26.6 % — AB (ref 11.5–14.5)
WBC: 6.1 10*3/uL (ref 3.8–10.6)

## 2017-06-26 LAB — ABO/RH: ABO/RH(D): B POS

## 2017-06-26 MED ORDER — BOOST / RESOURCE BREEZE PO LIQD CUSTOM
1.0000 | Freq: Three times a day (TID) | ORAL | Status: DC
  Administered 2017-06-26 – 2017-06-28 (×7): 1 via ORAL

## 2017-06-26 MED ORDER — OXYCODONE HCL 5 MG PO TABS
5.0000 mg | ORAL_TABLET | Freq: Four times a day (QID) | ORAL | Status: DC | PRN
  Administered 2017-06-26 (×2): 5 mg via ORAL
  Filled 2017-06-26 (×2): qty 1

## 2017-06-26 NOTE — Plan of Care (Signed)
°  Problem: Clinical Measurements: °Goal: Cardiovascular complication will be avoided °Outcome: Progressing °  °Problem: Activity: °Goal: Risk for activity intolerance will decrease °Outcome: Progressing °  °

## 2017-06-26 NOTE — Progress Notes (Signed)
[facility] [place] NAME: Joseph Hawkins    MR#:  [id number]  DATE OF BIRTH:  Apr 19, 1934  SUBJECTIVE:  CHIEF COMPLAINT:   Chief Complaint  Patient presents with  . GI Bleeding   Had some dark stool this morning, tolerating liquids. REVIEW OF SYSTEMS:  CONSTITUTIONAL: No fever,have fatigue or weakness.  EYES: No blurred or double vision.  EARS, NOSE, AND THROAT: No tinnitus or ear pain.  RESPIRATORY: No cough, shortness of breath, wheezing or hemoptysis.  CARDIOVASCULAR: No chest pain, orthopnea, edema.  GASTROINTESTINAL: No nausea, vomiting, diarrhea or abdominal pain.  GENITOURINARY: No dysuria, hematuria.  ENDOCRINE: No polyuria, nocturia,  HEMATOLOGY: No anemia, easy bruising or bleeding SKIN: No rash or lesion. MUSCULOSKELETAL: No joint pain or arthritis.   NEUROLOGIC: No tingling, numbness, weakness.  PSYCHIATRY: No anxiety or depression.   ROS  DRUG ALLERGIES:  No Known Allergies  VITALS:  Blood pressure 104/67, pulse 66, temperature 97.7 F (36.5 C), temperature source Oral, resp. rate 18, height 6' (1.829 m), weight 72.7 kg (160 lb 4.4 oz), SpO2 100 %.  PHYSICAL EXAMINATION:   GENERAL:  83 y.o.-year-old patient lying in the bed with no acute distress. Appears malnourished. EYES: Pupils equal, round, reactive to light and accommodation. No scleral icterus. Extraocular muscles intact.  HEENT: Head atraumatic, normocephalic. Oropharynx and nasopharynx clear.  NECK:  Supple, no jugular venous distention. No thyroid enlargement, no tenderness.  LUNGS: Normal breath sounds bilaterally, no wheezing, rales,rhonchi or crepitation. No use of accessory muscles of respiration.  CARDIOVASCULAR: S1, S2 normal. No murmurs, rubs, or gallops.  ABDOMEN: Soft, nontender, no distended. Bowel sounds present. No organomegaly or mass.  EXTREMITIES: No pedal edema, cyanosis, or clubbing.  NEUROLOGIC: Cranial nerves II through XII are intact. Muscle  strength 3-4/5 in all extremities. Sensation intact. Gait not checked.  PSYCHIATRIC: The patient is alert and oriented x 2.  SKIN: No obvious rash, lesion, or ulcer.     Physical Exam LABORATORY PANEL:   CBC Recent Labs  Lab 06/26/17 0457  WBC 6.1  HGB 10.6*  HCT 30.2*  PLT 61*   ------------------------------------------------------------------------------------------------------------------  Chemistries  Recent Labs  Lab 06/25/17 0751 06/26/17 0457  NA 133* 134*  K 3.9 5.0  CL 104 106  CO2 23 22  GLUCOSE 99 91  BUN 38* 46*  CREATININE 1.76* 1.68*  CALCIUM 9.2 9.3  AST 57*  --   ALT 32  --   ALKPHOS 174*  --   BILITOT 3.9*  --    ------------------------------------------------------------------------------------------------------------------  Cardiac Enzymes No results for input(s): TROPONINI in the last 168 hours. ------------------------------------------------------------------------------------------------------------------  RADIOLOGY:  US Paracentesis  Result Date: 06/25/2017 INDICATION: 83 year old with recurrent ascites. EXAM: ULTRASOUND GUIDED PARACENTESIS MEDICATIONS: None. COMPLICATIONS: None immediate. PROCEDURE: Informed written consent was obtained from the patient and the patient's daughter after a discussion of the risks, benefits and alternatives to treatment. A timeout was performed prior to the initiation of the procedure. Initial ultrasound scanning demonstrates a large amount of ascites within the right lower abdominal quadrant. The right lower abdomen was prepped and draped in the usual sterile fashion. 1% lidocaine was used for local anesthesia. Following this, a 6 Fr Safe-T-Centesis catheter was introduced. An ultrasound image was saved for documentation purposes. The paracentesis was performed. The catheter was removed and a dressing was applied. The patient tolerated the procedure well without immediate post procedural complication. FINDINGS:  A total of approximately 9.1 L of yellow fluid was removed. IMPRESSION:  Successful ultrasound-guided paracentesis yielding 9.1 liters of peritoneal fluid. Electronically Signed   By: Markus Daft M.D.   On: 06/25/2017 14:42    ASSESSMENT AND PLAN:   Active Problems:   GI bleed   Acute posthemorrhagic anemia   Portal hypertension (HCC)   Ulcer, duodenal, with hemorrhage   Protein-calorie malnutrition, severe    * GI bleed   Likely upper GI secondary to liver cirrhosis   Continue octreotide and Protonix drip   GI consult appreciated EGD done- large duodenal ulcer with blood clot.   If bleed more, will need vascular or IR for embolization.   Monitor hemoglobin- stable.  * Acute blood loss anemia   Hb dropped compared to past week.   Monitor for now.  * Liver cirrhosis and ascites   IR guided tap - 9 ltr fluid removed.  * hypothyroidism   Cont levothyroxine  * COPD, no exacerbation   Cont home inhalers.  * cKd stage 3- stable , monitor.   All the records are reviewed and case discussed with Care Management/Social Workerr. Management plans discussed with the patient, family and they are in agreement.  CODE STATUS: full.  TOTAL TIME TAKING CARE OF THIS PATIENT: 35 minutes.     POSSIBLE D/C IN 1-2 DAYS, DEPENDING ON CLINICAL CONDITION.   Vaughan Basta M.D on 06/26/2017   Between 7am to 6pm - Pager - [phone]  After 6pm go to [email or web address] - password [email or web address] [facility]  Office  [phone]  CC: Primary care physician; Baxter Hire, MD  Note: This dictation was prepared with Dragon dictation along with smaller phrase technology. Any transcriptional errors that result from this process are unintentional.

## 2017-06-26 NOTE — Progress Notes (Addendum)
Per MD okay for RN to place order for oxycodone 5 mg Q6 PRN, and order a PT consult.

## 2017-06-26 NOTE — Discharge Instructions (Signed)

## 2017-06-26 NOTE — Anesthesia Postprocedure Evaluation (Signed)
Anesthesia Post Note  Patient: Joseph Hawkins  Procedure(s) Performed: ESOPHAGOGASTRODUODENOSCOPY (EGD) WITH PROPOFOL (Left )  Patient location during evaluation: Endoscopy Anesthesia Type: General Level of consciousness: awake and alert Pain management: pain level controlled Vital Signs Assessment: post-procedure vital signs reviewed and stable Respiratory status: spontaneous breathing, nonlabored ventilation, respiratory function stable and patient connected to nasal cannula oxygen Cardiovascular status: blood pressure returned to baseline and stable Postop Assessment: no apparent nausea or vomiting Anesthetic complications: no     Last Vitals:  Vitals:   06/26/17 0443 06/26/17 1343  BP: (!) 98/54 104/67  Pulse: 84 66  Resp: 18 18  Temp: 37 C 36.5 C  SpO2: 97% 100%    Last Pain:  Vitals:   06/26/17 1446  TempSrc:   PainSc: Asleep                 Precious Haws Piscitello

## 2017-06-26 NOTE — Progress Notes (Signed)
Initial Nutrition Assessment  DOCUMENTATION CODES:   Severe malnutrition in context of chronic illness  INTERVENTION:   - Boost Breeze po TID, each supplement provides 250 kcal and 9 grams of protein  RD to monitor for diet advancement and change supplement as appropriate.  NUTRITION DIAGNOSIS:   Severe Malnutrition related to chronic illness(CKD, alcoholic liver disease, MDS) as evidenced by moderate fat depletion, severe fat depletion, moderate muscle depletion, severe muscle depletion.  GOAL:   Patient will meet greater than or equal to 90% of their needs  MONITOR:   PO intake, Supplement acceptance, Diet advancement, Labs, Weight trends  REASON FOR ASSESSMENT:   Malnutrition Screening Tool    ASSESSMENT:   83 year old male who presented to the ED from home with complaints of GI bleed. PMH significant for anemia, CKD, hypertension, upper GI bleed, esophageal and gastric varices with history of alcohol abuse, alcoholic liver disease, and myelodysplastic syndrome. Pt given 2 units of emergency blood in ED.  6/25 - s/p paracentesis with 9.1 L fluid removed from abdomen, s/p EGD showing large duodenal ulcer and severe esophagitis  Spoke with pt's wife and daughter at bedside. Pt's daughter and wife report that pt has been following a low sodium diet since May and that he has not had a good appetite during that time. Pt's daughter states that pt "wasn't eating or drinking much of anything over the last 2 weeks" and believes it was due to his ulcer. Pt's wife reports that pt did not like any of the low sodium foods which was another reason that his PO intake decreased. Pt has been consuming 0-1 Ensure oral nutrition supplements and 1 meal daily. A meal might include oatmeal and crackers with cheese.  Pt's daughter report pt drank the beef broth at breakfast this morning. Pt's wife and daughter agreeable to pt receiving Boost Breeze to maximize protein intake while pt is on a clear  liquid diet. RD to order and follow for diet advancement.  Pt's wife endorses recent weight loss in pt. Per pt's wife, pt's UBW is 205 lbs and that he last weighed this at the beginning of this month. RD obtained weight at time of visit: 159 lbs. Per weight history in chart, pt has lost 51 lbs since 05/07/17. This is a 24% weight loss in less than 2 months which is significant for timeframe. Suspect some of weight loss is related to fluid loss; however, severity of weight loss also likely related to subcutaneous fat and muscle loss given findings of NFPE.  Meal Completion: 100% (clear liquid diet)  Medications reviewed and include: 1000 units vitamin D daily, 325 mg ferrous sulfate daily, 1 mg folic acid daily, 75 mcg levothyroxine daily, 100 mg thiamine daily, IV Protonix @ 25 ml/hr  Labs reviewed: sodium 134 (L), BUN 46 (H), creatinine 1.68 (H), hemoglobin 10.6 (L), HCT 30.2 (L)  NUTRITION - FOCUSED PHYSICAL EXAM:    Most Recent Value  Orbital Region  Moderate depletion  Upper Arm Region  Severe depletion  Thoracic and Lumbar Region  Severe depletion  Buccal Region  Severe depletion  Temple Region  Severe depletion  Clavicle Bone Region  Severe depletion  Clavicle and Acromion Bone Region  Severe depletion  Scapular Bone Region  Unable to assess  Dorsal Hand  Moderate depletion  Patellar Region  Severe depletion  Anterior Thigh Region  Severe depletion  Posterior Calf Region  Severe depletion  Edema (RD Assessment)  None  Hair  Reviewed  Eyes  Unable  to assess  Mouth  Unable to assess  Skin  Reviewed  Nails  Reviewed       Diet Order:   Diet Order           Diet clear liquid Room service appropriate? Yes; Fluid consistency: Thin  Diet effective now          EDUCATION NEEDS:   Education needs have been addressed  Skin:  Skin Assessment: Reviewed RN Assessment  Last BM:  06/25/17 medium type 6  Height:   Ht Readings from Last 1 Encounters:  06/25/17 6' (1.829 m)     Weight:   Wt Readings from Last 1 Encounters:  06/26/17 160 lb 4.4 oz (72.7 kg)    Ideal Body Weight:  80.9 kg  BMI:  Body mass index is 21.74 kg/m.  Estimated Nutritional Needs:   Kcal:  1700-1900 kcal/day  Protein:  85-100 grams/day  Fluid:  per MD goals    Gaynell Face, MS, RD, LDN Pager: [phone] Weekend/After Hours: [phone]

## 2017-06-26 NOTE — Telephone Encounter (Signed)
Santiago Glad from [facility] MRI department is calling to in form Dr. Vicente Males and Nurse pt is scheduled for MRI 6/29 but is currently in patient please call Santiago Glad to let her know if MRI needs to be rescheduled [phone]  And to reschedule apt call [phone]

## 2017-06-26 NOTE — Progress Notes (Signed)
[name], MD [facility]   [address] [place], [place] [address] Phone: [phone] Fax : [phone]   Subjective: The patient has had no further sign of any GI bleeding overnight.  The patient had a large duodenal ulcer with severe esophagitis found at the EGD yesterday.  The patient's known esophageal varices and gastric varices were not the source of the patient's bleeding.  The ulcer had a adherent clot which was not removed due to the patient's high risk of decompensating during the procedure.  The patient remains nondistended after having 9 L of fluid removed from his abdomen by radiology during a paracentesis yesterday.   Objective: Vital signs in last 24 hours: Vitals:   06/25/17 1512 06/25/17 2119 06/26/17 0443 06/26/17 1143  BP: 119/74 114/75 (!) 98/54   Pulse: 72 74 84   Resp: (!) 27 20 18    Temp:  98.2 F (36.8 C) 98.6 F (37 C)   TempSrc:  Oral Oral   SpO2: 100% 100% 97%   Weight:    160 lb 4.4 oz (72.7 kg)  Height:       Weight change:   Intake/Output Summary (Last 24 hours) at 06/26/2017 1153 Last data filed at 06/26/2017 0900 Gross per 24 hour  Intake 1907.16 ml  Output 100 ml  Net 1807.16 ml     Exam: Heart:: Regular rate and rhythm, S1S2 present or without murmur or extra heart sounds Lungs: normal and clear to auscultation and percussion Abdomen: soft, nontender, normal bowel sounds   Lab Results: @LABTEST2 @ Micro Results: No results found for this or any previous visit (from the past 240 hour(s)). Studies/Results: US Paracentesis  Result Date: 06/25/2017 INDICATION: 83 year old with recurrent ascites. EXAM: ULTRASOUND GUIDED PARACENTESIS MEDICATIONS: None. COMPLICATIONS: None immediate. PROCEDURE: Informed written consent was obtained from the patient and the patient's daughter after a discussion of the risks, benefits and alternatives to treatment. A timeout was performed prior to the initiation of the procedure. Initial ultrasound  scanning demonstrates a large amount of ascites within the right lower abdominal quadrant. The right lower abdomen was prepped and draped in the usual sterile fashion. 1% lidocaine was used for local anesthesia. Following this, a 6 Fr Safe-T-Centesis catheter was introduced. An ultrasound image was saved for documentation purposes. The paracentesis was performed. The catheter was removed and a dressing was applied. The patient tolerated the procedure well without immediate post procedural complication. FINDINGS: A total of approximately 9.1 L of yellow fluid was removed. IMPRESSION: Successful ultrasound-guided paracentesis yielding 9.1 liters of peritoneal fluid. Electronically Signed   By: Markus Daft M.D.   On: 06/25/2017 14:42   Medications: I have reviewed the patient's current medications. Scheduled Meds: . allopurinol  300 mg Oral Daily  . cholecalciferol  1,000 Units Oral Daily  . feeding supplement  1 Container Oral TID BM  . ferrous sulfate  325 mg Oral Q breakfast  . folic acid  1 mg Oral Daily  . levothyroxine  75 mcg Oral QAC breakfast  . midodrine  10 mg Oral TID WC  . mometasone-formoterol  2 puff Inhalation BID  . tamsulosin  0.4 mg Oral Daily  . thiamine  100 mg Oral Daily   Continuous Infusions: . octreotide  (SANDOSTATIN)    IV infusion 50 mcg/hr (06/26/17 0447)  . pantoprozole (PROTONIX) infusion 8 mg/hr (06/26/17 0447)   PRN Meds:.albuterol, docusate sodium   Assessment: Active Problems:   GI bleed   Acute posthemorrhagic anemia   Portal hypertension ([place])  Ulcer, duodenal, with hemorrhage    Plan: This patient is stable today after having melanotic stools and a GI bleed due to a large cavitating ulcer in the bulb of the duodenum.  Vascular surgery was made aware yesterday in case the patient has any further bleeding.  He is not a surgical candidate due to his cirrhosis and history of ascites.  The patient would likely benefit from interventional vascular  embolization if the patient should rebleed.  The patient and his family have been explained the plan and agree with it.  Continue the patient's PPI.   LOS: 1 day   [name] 06/26/2017, 11:53 AM

## 2017-06-27 ENCOUNTER — Inpatient Hospital Stay: Admission: RE | Admit: 2017-06-27 | Payer: Medicare HMO | Source: Ambulatory Visit | Admitting: Gastroenterology

## 2017-06-27 ENCOUNTER — Encounter: Admission: RE | Payer: Self-pay | Source: Ambulatory Visit

## 2017-06-27 LAB — CBC
HCT: 26.9 % — ABNORMAL LOW (ref 40.0–52.0)
Hemoglobin: 9.2 g/dL — ABNORMAL LOW (ref 13.0–18.0)
MCH: 37.9 pg — AB (ref 26.0–34.0)
MCHC: 34.1 g/dL (ref 32.0–36.0)
MCV: 111.2 fL — ABNORMAL HIGH (ref 80.0–100.0)
Platelets: 59 10*3/uL — ABNORMAL LOW (ref 150–440)
RBC: 2.42 MIL/uL — ABNORMAL LOW (ref 4.40–5.90)
RDW: 25.9 % — AB (ref 11.5–14.5)
WBC: 3.3 10*3/uL — ABNORMAL LOW (ref 3.8–10.6)

## 2017-06-27 LAB — BPAM RBC
Blood Product Expiration Date: 201907132359
Blood Product Expiration Date: 201907132359
ISSUE DATE / TIME: 201906250755
ISSUE DATE / TIME: 201906250755
UNIT TYPE AND RH: 5100
Unit Type and Rh: 5100

## 2017-06-27 LAB — TYPE AND SCREEN
ABO/RH(D): B POS
Antibody Screen: NEGATIVE
UNIT DIVISION: 0
UNIT DIVISION: 0

## 2017-06-27 LAB — BASIC METABOLIC PANEL
Anion gap: 5 (ref 5–15)
BUN: 40 mg/dL — AB (ref 8–23)
CALCIUM: 8.6 mg/dL — AB (ref 8.9–10.3)
CO2: 20 mmol/L — ABNORMAL LOW (ref 22–32)
CREATININE: 1.78 mg/dL — AB (ref 0.61–1.24)
Chloride: 107 mmol/L (ref 98–111)
GFR calc non Af Amer: 34 mL/min — ABNORMAL LOW (ref >60)
GFR, EST AFRICAN AMERICAN: 39 mL/min — AB (ref >60)
Glucose, Bld: 176 mg/dL — ABNORMAL HIGH (ref 70–99)
Potassium: 3.6 mmol/L (ref 3.5–5.1)
Sodium: 132 mmol/L — ABNORMAL LOW (ref 135–145)

## 2017-06-27 SURGERY — ESOPHAGOGASTRODUODENOSCOPY (EGD) WITH PROPOFOL
Anesthesia: General

## 2017-06-27 MED ORDER — SODIUM CHLORIDE 0.9 % IV SOLN
INTRAVENOUS | Status: DC
  Administered 2017-06-27: 09:00:00 via INTRAVENOUS

## 2017-06-27 MED ORDER — PANTOPRAZOLE SODIUM 40 MG PO TBEC
40.0000 mg | DELAYED_RELEASE_TABLET | Freq: Two times a day (BID) | ORAL | Status: DC
  Administered 2017-06-27 – 2017-06-28 (×3): 40 mg via ORAL
  Filled 2017-06-27 (×3): qty 1

## 2017-06-27 NOTE — Progress Notes (Signed)
[employer] [facility] [place] NAME: Joseph Hawkins    MR#:  [id number]  DATE OF BIRTH:  October 10, 1934  SUBJECTIVE:  CHIEF COMPLAINT:   Chief Complaint  Patient presents with  . GI Bleeding   tolerating liquids. No Bm since yesterday. No pain.  REVIEW OF SYSTEMS:  CONSTITUTIONAL: No fever,have fatigue or weakness.  EYES: No blurred or double vision.  EARS, NOSE, AND THROAT: No tinnitus or ear pain.  RESPIRATORY: No cough, shortness of breath, wheezing or hemoptysis.  CARDIOVASCULAR: No chest pain, orthopnea, edema.  GASTROINTESTINAL: No nausea, vomiting, diarrhea or abdominal pain.  GENITOURINARY: No dysuria, hematuria.  ENDOCRINE: No polyuria, nocturia,  HEMATOLOGY: No anemia, easy bruising or bleeding SKIN: No rash or lesion. MUSCULOSKELETAL: No joint pain or arthritis.   NEUROLOGIC: No tingling, numbness, weakness.  PSYCHIATRY: No anxiety or depression.   ROS  DRUG ALLERGIES:  No Known Allergies  VITALS:  Blood pressure 106/60, pulse (!) 55, temperature 97.7 F (36.5 C), temperature source Oral, resp. rate 16, height 6' (1.829 m), weight 72.7 kg (160 lb 4.4 oz), SpO2 98 %.  PHYSICAL EXAMINATION:   GENERAL:  83 y.o.-year-old patient lying in the bed with no acute distress. Appears malnourished. EYES: Pupils equal, round, reactive to light and accommodation. No scleral icterus. Extraocular muscles intact.  HEENT: Head atraumatic, normocephalic. Oropharynx and nasopharynx clear.  NECK:  Supple, no jugular venous distention. No thyroid enlargement, no tenderness.  LUNGS: Normal breath sounds bilaterally, no wheezing, rales,rhonchi or crepitation. No use of accessory muscles of respiration.  CARDIOVASCULAR: S1, S2 normal. No murmurs, rubs, or gallops.  ABDOMEN: Soft, nontender, no distended. Bowel sounds present. No organomegaly or mass.  EXTREMITIES: No pedal edema, cyanosis, or clubbing.  NEUROLOGIC: Cranial nerves II through XII are intact.  Muscle strength 3-4/5 in all extremities. Sensation intact. Gait not checked.  PSYCHIATRIC: The patient is alert and oriented x 2.  SKIN: No obvious rash, lesion, or ulcer.   Physical Exam LABORATORY PANEL:   CBC Recent Labs  Lab 06/27/17 0543  WBC 3.3*  HGB 9.2*  HCT 26.9*  PLT 59*   ------------------------------------------------------------------------------------------------------------------  Chemistries  Recent Labs  Lab 06/25/17 0751  06/27/17 0543  NA 133*   < > 132*  K 3.9   < > 3.6  CL 104   < > 107  CO2 23   < > 20*  GLUCOSE 99   < > 176*  BUN 38*   < > 40*  CREATININE 1.76*   < > 1.78*  CALCIUM 9.2   < > 8.6*  AST 57*  --   --   ALT 32  --   --   ALKPHOS 174*  --   --   BILITOT 3.9*  --   --    < > = values in this interval not displayed.   ------------------------------------------------------------------------------------------------------------------  Cardiac Enzymes No results for input(s): TROPONINI in the last 168 hours. ------------------------------------------------------------------------------------------------------------------  RADIOLOGY:  No results found.  ASSESSMENT AND PLAN:   Active Problems:   GI bleed   Acute posthemorrhagic anemia   Portal hypertension (HCC)   Ulcer, duodenal, with hemorrhage   Protein-calorie malnutrition, severe    * GI bleed   Likely upper GI secondary to liver cirrhosis   Continue octreotide and Protonix drip   GI consult appreciated EGD done- large duodenal ulcer with blood clot.   If bleed more, will need vascular or IR for embolization.   Monitor hemoglobin- stable.  Change to oral protonix.  * Acute blood loss anemia   Hb dropped compared to past week.   Monitor for now.  stable now.  * Liver cirrhosis and ascites   IR guided tap - 9 ltr fluid removed.   Due to low BP- can not add spironolactone.  * hypothyroidism   Cont levothyroxine  * COPD, no exacerbation   Cont home  inhalers.  * cKd stage 3- stable , monitor.  * hypotension   On midodrine already  * Thrpmbocytopenia    Due to liver cirrhosis, monitor.  All the records are reviewed and case discussed with Care Management/Social Workerr. Management plans discussed with the patient, family and they are in agreement.  CODE STATUS: full.  TOTAL TIME TAKING CARE OF THIS PATIENT: 35 minutes.     POSSIBLE D/C IN 1-2 DAYS, DEPENDING ON CLINICAL CONDITION.   Vaughan Basta M.D on 06/27/2017   Between 7am to 6pm - Pager - [phone]  After 6pm go to [email or web address] - password [email or web address] [facility]  Office  [phone]  CC: Primary care physician; Baxter Hire, MD  Note: This dictation was prepared with Dragon dictation along with smaller phrase technology. Any transcriptional errors that result from this process are unintentional.

## 2017-06-27 NOTE — Progress Notes (Signed)
  Joseph Lame, MD [facility]   [address], [place], [place] [address] Phone: [phone] Fax : [phone]   Subjective: The patient has not had any further sign of GI bleeding although his hemoglobin has gone down by 1 g.  The patient denies any abdominal pain black stools or bloody stools.  The patient's daughter also states that he is more awake and alert today without any complaints.   Objective: Vital signs in last 24 hours: Vitals:   06/26/17 2112 06/27/17 0603 06/27/17 0901 06/27/17 1143  BP: (!) 90/49 (!) 87/50 101/63 106/60  Pulse: 60 (!) 57 69 (!) 55  Resp: 18 17 16 16   Temp: (!) 97.5 F (36.4 C) 98 F (36.7 C) 97.8 F (36.6 C) 97.7 F (36.5 C)  TempSrc: Oral Oral Oral Oral  SpO2: 99% 100% 99% 98%  Weight:      Height:       Weight change: -19 lb 11.6 oz (-8.947 kg)  Intake/Output Summary (Last 24 hours) at 06/27/2017 2115 Last data filed at 06/27/2017 8502 Gross per 24 hour  Intake 2726 ml  Output 300 ml  Net 2426 ml     Exam: Heart:: Regular rate and rhythm, S1S2 present or without murmur or extra heart sounds Lungs: normal and clear to auscultation and percussion Abdomen: soft, nontender, normal bowel sounds   Lab Results: @LABTEST2 @ Micro Results: No results found for this or any previous visit (from the past 240 hour(s)). Studies/Results: No results found. Medications: I have reviewed the patient's current medications. Scheduled Meds: . allopurinol  300 mg Oral Daily  . cholecalciferol  1,000 Units Oral Daily  . feeding supplement  1 Container Oral TID BM  . ferrous sulfate  325 mg Oral Q breakfast  . folic acid  1 mg Oral Daily  . levothyroxine  75 mcg Oral QAC breakfast  . midodrine  10 mg Oral TID WC  . mometasone-formoterol  2 puff Inhalation BID  . pantoprazole  40 mg Oral BID AC  . tamsulosin  0.4 mg Oral Daily  . thiamine  100 mg Oral Daily   Continuous Infusions: . sodium chloride 50 mL/hr at 06/27/17 0834  . octreotide   (SANDOSTATIN)    IV infusion 50 mcg/hr (06/27/17 1217)   PRN Meds:.albuterol, docusate sodium, oxyCODONE   Assessment: Active Problems:   GI bleed   Acute posthemorrhagic anemia   Portal hypertension (HCC)   Ulcer, duodenal, with hemorrhage   Protein-calorie malnutrition, severe    Plan: This patient has cirrhosis with a recent GI bleed with melena.  The patient had an upper endoscopy showing a large duodenal ulcer with portal hypertensive gastropathy.  The patient wasn't actively bleeding and it was decided to treat the patient conservatively.  If the patient should have any further rebleeding he should have intervention with vascular surgery.   LOS: 2 days   Joseph Hawkins 06/27/2017, 9:15 PM

## 2017-06-28 ENCOUNTER — Encounter: Payer: Self-pay | Admitting: Gastroenterology

## 2017-06-28 LAB — CBC
HEMATOCRIT: 32.4 % — AB (ref 40.0–52.0)
HEMOGLOBIN: 11.1 g/dL — AB (ref 13.0–18.0)
MCH: 38.3 pg — AB (ref 26.0–34.0)
MCHC: 34.1 g/dL (ref 32.0–36.0)
MCV: 112.5 fL — AB (ref 80.0–100.0)
PLATELETS: 61 10*3/uL — AB (ref 150–440)
RBC: 2.88 MIL/uL — AB (ref 4.40–5.90)
RDW: 26.1 % — ABNORMAL HIGH (ref 11.5–14.5)
WBC: 7.5 10*3/uL (ref 3.8–10.6)

## 2017-06-28 MED ORDER — ENSURE ENLIVE PO LIQD: 237.0000 mL | Freq: Two times a day (BID) | ORAL | Status: DC

## 2017-06-28 MED ORDER — DOCUSATE SODIUM 100 MG PO CAPS: 100.0000 mg | ORAL_CAPSULE | Freq: Two times a day (BID) | ORAL | 0 refills | Status: AC | PRN

## 2017-06-28 MED ORDER — PANTOPRAZOLE SODIUM 40 MG PO TBEC
40.0000 mg | DELAYED_RELEASE_TABLET | Freq: Two times a day (BID) | ORAL | 0 refills | Status: AC
Start: 2017-06-28 — End: ?

## 2017-06-28 MED ORDER — ENSURE ENLIVE PO LIQD: 237.0000 mL | Freq: Two times a day (BID) | ORAL | 12 refills | Status: AC

## 2017-06-28 NOTE — Care Management Important Message (Signed)
Important Message  Patient Details  Name: Joseph Hawkins MRN: [id number] Date of Birth: 1934-06-11   Medicare Important Message Given:  Yes    Juliann Pulse A Shanetta Nicolls 06/28/2017, 10:46 AM

## 2017-06-28 NOTE — Evaluation (Addendum)
Physical Therapy Evaluation Patient Details Name: Joseph Hawkins MRN: [id number] DOB: [birth year] Today's Date: 06/28/2017   History of Present Illness  83 y.o. male presenting to hospital with GI bleed, he was here early May with fever, non-productive cough, and diarrhea, sepsis.   PMH includes anemia, CKD, htn, a-fib with RVR, esophageal varices in cirrhosis.    Clinical Impression  Pt did well with mobility, ambulation and stair negotiation w/o needing physical assist.  He did use walker most of the time but was able to ambulate with only single hallway rail and even ~10 ft w/o AD/UEs.  Pt with some fatigue during the effort but O2 remained high 90s and he had no lightheadness, LOBs, etc.    Follow Up Recommendations Home health PT    Equipment Recommendations  None recommended by PT    Recommendations for Other Services       Precautions / Restrictions Precautions Precautions: Fall Restrictions Weight Bearing Restrictions: No      Mobility  Bed Mobility Overal bed mobility: Independent                Transfers Overall transfer level: Modified independent Equipment used: Rolling walker (2 wheeled)             General transfer comment: Definite need of UEs to rise from low bed setting, but safe and confident  Ambulation/Gait Ambulation/Gait assistance: Modified independent (Device/Increase time) Gait Distance (Feet): 100 Feet Assistive device: Rolling walker (2 wheeled);None(hallway rail)       General Gait Details: Pt with slow but consistent and safe cadence.  Most of the effort with walker, ~20 ft with handrail and ~10 ft w/o UEs.   Stairs Stairs: Yes Stairs assistance: Supervision Stair Management: One rail Right Number of Stairs: 6 General stair comments: Pt safe and confident with steps, reciprocal pattern, no physical assist  Wheelchair Mobility    Modified Rankin (Stroke Patients Only)       Balance Overall balance assessment: Modified  Independent                                           Pertinent Vitals/Pain Pain Assessment: No/denies pain    Home Living Family/patient expects to be discharged to:: Private residence Living Arrangements: Spouse/significant other Available Help at Discharge: Family Type of Home: House Home Access: Stairs to enter Entrance Stairs-Rails: Right Entrance Stairs-Number of Steps: 4-5 Home Layout: One level Home Equipment: Cane - single point;Walker - 2 wheels      Prior Function Level of Independence: Independent with assistive device(s)         Comments: Pt has been using SPC consistently for the last 8 weeks.     Hand Dominance        Extremity/Trunk Assessment   Upper Extremity Assessment Upper Extremity Assessment: Generalized weakness    Lower Extremity Assessment Lower Extremity Assessment: Generalized weakness       Communication   Communication: No difficulties  Cognition Arousal/Alertness: Awake/alert Behavior During Therapy: WFL for tasks assessed/performed Overall Cognitive Status: Within Functional Limits for tasks assessed                                        General Comments      Exercises     Assessment/Plan    PT  Assessment Patient needs continued PT services  PT Problem List Decreased strength;Decreased activity tolerance;Decreased range of motion;Decreased balance;Decreased mobility;Decreased knowledge of use of DME;Decreased safety awareness;Decreased coordination       PT Treatment Interventions DME instruction;Gait training;Stair training;Therapeutic exercise;Therapeutic activities;Functional mobility training;Balance training;Cognitive remediation;Patient/family education    PT Goals (Current goals can be found in the Care Plan section)  Acute Rehab PT Goals Patient Stated Goal: go home PT Goal Formulation: With patient Time For Goal Achievement: 07/12/17 Potential to Achieve Goals: Good     Frequency Min 2X/week   Barriers to discharge        Co-evaluation               AM-PAC PT "6 Clicks" Daily Activity  Outcome Measure Difficulty turning over in bed (including adjusting bedclothes, sheets and blankets)?: None Difficulty moving from lying on back to sitting on the side of the bed? : None Difficulty sitting down on and standing up from a chair with arms (e.g., wheelchair, bedside commode, etc,.)?: A Little Help needed moving to and from a bed to chair (including a wheelchair)?: None Help needed walking in hospital room?: None Help needed climbing 3-5 steps with a railing? : A Little 6 Click Score: 22    End of Session Equipment Utilized During Treatment: Gait belt Activity Tolerance: Patient tolerated treatment well Patient left: with bed alarm set;with call bell/phone within reach   PT Visit Diagnosis: Muscle weakness (generalized) (M62.81);Difficulty in walking, not elsewhere classified (R26.2)    Time: 8280-0349 PT Time Calculation (min) (ACUTE ONLY): 22 min   Charges:   PT Evaluation $PT Eval Low Complexity: 1 Low      Kreg Shropshire, DPT 06/28/2017, 12:38 PM

## 2017-06-28 NOTE — Care Management Note (Signed)
Case Management Note  Patient Details  Name: Joseph Hawkins MRN: [id number] Date of Birth: 1935/01/01    [name] with [facility] notified of discharge  Subjective/Objective:                    Action/Plan:   Expected Discharge Date:  06/28/17               Expected Discharge Plan:  [place]  In-House Referral:     Discharge planning Services  CM Consult  Post Acute Care Choice:  Home Health Choice offered to:  Patient, Adult Children  DME Arranged:    DME Agency:     HH Arranged:  RN, PT, Nurse's Aide [place] Agency:  [facility]  Status of Service:  Completed, signed off  If discussed at [place] of Stay Meetings, dates discussed:    Additional Comments:  Beverly Sessions, RN 06/28/2017, 3:45 PM

## 2017-06-28 NOTE — Progress Notes (Signed)
[name] to be D/C'd Home per MD order.  Discussed prescriptions and follow up appointments with the patient. Prescriptions given to patient, medication list explained in detail. Pt verbalized understanding.  Allergies as of 06/28/2017   No Known Allergies     Medication List    TAKE these medications   albuterol (2.5 MG/3ML) 0.083% nebulizer solution Commonly known as:  PROVENTIL Take 3 mLs (2.5 mg total) by nebulization every 6 (six) hours as needed for wheezing or shortness of breath.   allopurinol 300 MG tablet Commonly known as:  ZYLOPRIM Take 300 mg by mouth daily.   cholecalciferol 1000 units tablet Commonly known as:  VITAMIN D Take 1,000 Units by mouth daily.   docusate sodium 100 MG capsule Commonly known as:  COLACE Take 1 capsule (100 mg total) by mouth 2 (two) times daily as needed for mild constipation.   feeding supplement (ENSURE ENLIVE) Liqd Take 237 mLs by mouth 2 (two) times daily between meals.   FERROUSUL 325 (65 FE) MG tablet Generic drug:  ferrous sulfate Take 325 mg by mouth daily with breakfast.   folic acid 1 MG tablet Commonly known as:  FOLVITE Take 1 tablet (1 mg total) by mouth daily.   levothyroxine 75 MCG tablet Commonly known as:  SYNTHROID, LEVOTHROID Take 75 mcg by mouth daily before breakfast.   midodrine 10 MG tablet Commonly known as:  PROAMATINE Take 1 tablet (10 mg total) by mouth 3 (three) times daily with meals.   mometasone-formoterol 100-5 MCG/ACT Aero Commonly known as:  DULERA Inhale 2 puffs into the lungs 2 (two) times daily.   pantoprazole 40 MG tablet Commonly known as:  PROTONIX Take 1 tablet (40 mg total) by mouth 2 (two) times daily before a meal.   tamsulosin 0.4 MG Caps capsule Commonly known as:  FLOMAX Take 1 capsule (0.4 mg total) by mouth daily.   thiamine 100 MG tablet Take 1 tablet (100 mg total) by mouth daily.       Vitals:   06/28/17 0549 06/28/17 1143  BP: (!) 91/59 (!) 87/58  Pulse: 80  (!) 108  Resp: 16 16  Temp: 97.7 F (36.5 C) 97.9 F (36.6 C)  SpO2: 99% 95%    Skin clean, dry and intact without evidence of skin break down, no evidence of skin tears noted. IV catheter discontinued intact. Site without signs and symptoms of complications. Dressing and pressure applied. Pt denies pain at this time. No complaints noted.  An After Visit Summary was printed and given to the patient. Patient escorted via [place], and D/C home via private auto.  Sharalyn Ink

## 2017-06-29 ENCOUNTER — Ambulatory Visit: Admission: RE | Admit: 2017-06-29 | Payer: Medicare HMO | Source: Ambulatory Visit

## 2017-07-02 LAB — PREPARE RBC (CROSSMATCH)

## 2017-07-04 NOTE — Discharge Summary (Signed)
[facility] [place] NAME: Joseph Hawkins    MR#:  [id number]  DATE OF BIRTH:  1934-03-30  DATE OF ADMISSION:  06/25/2017 ADMITTING PHYSICIAN: Vaughan Basta, MD  DATE OF DISCHARGE: 06/28/2017  2:00 PM  PRIMARY CARE PHYSICIAN: Baxter Hire, MD    ADMISSION DIAGNOSIS:  Ascites [R18.8] Gastrointestinal hemorrhage, unspecified gastrointestinal hemorrhage type [K92.2]  DISCHARGE DIAGNOSIS:  Active Problems:   GI bleed   Acute posthemorrhagic anemia   Portal hypertension (HCC)   Ulcer, duodenal, with hemorrhage   Protein-calorie malnutrition, severe   SECONDARY DIAGNOSIS:   Past Medical History:  Diagnosis Date  . Anemia   . Chronic kidney disease   . Hypertension   . Hypothyroidism, unspecified 06/28/2013  . MDS (myelodysplastic syndrome) ([facility]) 08/02/2015    HOSPITAL COURSE:   *GI bleed Likely upper GI secondary to liver cirrhosis Continue octreotide and Protonix drip GI consult appreciated EGD done- large duodenal ulcer with blood clot.   If bleed more, will need vascular or IR for embolization. Monitor hemoglobin- stable.   Change to oral protonix.   Upgraded diet and pt tolerated well.  *Acute blood loss anemia Hb dropped compared to past week. Monitor for now.   stable now.  * Liver cirrhosis and ascites IR guided tap - 9 ltr fluid removed.   Due to low BP- can not add spironolactone.  * hypothyroidism Cont levothyroxine  * COPD, no exacerbation Cont home inhalers.  * cKd stage 3- stable , monitor.  * hypotension   On midodrine already  * Thrpmbocytopenia    Due to liver cirrhosis, monitor.   DISCHARGE CONDITIONS:   Stable.  CONSULTS OBTAINED:  Treatment Team:  [name], MD  DRUG ALLERGIES:  No Known Allergies  DISCHARGE MEDICATIONS:   Allergies as of 06/28/2017   No Known Allergies     Medication List    TAKE these medications   albuterol (2.5  MG/3ML) 0.083% nebulizer solution Commonly known as:  PROVENTIL Take 3 mLs (2.5 mg total) by nebulization every 6 (six) hours as needed for wheezing or shortness of breath.   allopurinol 300 MG tablet Commonly known as:  ZYLOPRIM Take 300 mg by mouth daily.   cholecalciferol 1000 units tablet Commonly known as:  VITAMIN D Take 1,000 Units by mouth daily.   docusate sodium 100 MG capsule Commonly known as:  COLACE Take 1 capsule (100 mg total) by mouth 2 (two) times daily as needed for mild constipation.   feeding supplement (ENSURE ENLIVE) Liqd Take 237 mLs by mouth 2 (two) times daily between meals.   FERROUSUL 325 (65 FE) MG tablet Generic drug:  ferrous sulfate Take 325 mg by mouth daily with breakfast.   folic acid 1 MG tablet Commonly known as:  FOLVITE Take 1 tablet (1 mg total) by mouth daily.   levothyroxine 75 MCG tablet Commonly known as:  SYNTHROID, LEVOTHROID Take 75 mcg by mouth daily before breakfast.   midodrine 10 MG tablet Commonly known as:  PROAMATINE Take 1 tablet (10 mg total) by mouth 3 (three) times daily with meals.   mometasone-formoterol 100-5 MCG/ACT Aero Commonly known as:  DULERA Inhale 2 puffs into the lungs 2 (two) times daily.   pantoprazole 40 MG tablet Commonly known as:  PROTONIX Take 1 tablet (40 mg total) by mouth 2 (two) times daily before a meal.   tamsulosin 0.4 MG Caps capsule Commonly known as:  FLOMAX Take 1 capsule (0.4 mg total) by  mouth daily.   thiamine 100 MG tablet Take 1 tablet (100 mg total) by mouth daily.        DISCHARGE INSTRUCTIONS:    Follow with PMD in 1-2 weeks,.  If you experience worsening of your admission symptoms, develop shortness of breath, life threatening emergency, suicidal or homicidal thoughts you must seek medical attention immediately by calling 911 or calling your MD immediately  if symptoms less severe.  You Must read complete instructions/literature along with all the possible  adverse reactions/side effects for all the Medicines you take and that have been prescribed to you. Take any new Medicines after you have completely understood and accept all the possible adverse reactions/side effects.   Please note  You were cared for by a hospitalist during your hospital stay. If you have any questions about your discharge medications or the care you received while you were in the hospital after you are discharged, you can call the unit and asked to speak with the hospitalist on call if the hospitalist that took care of you is not available. Once you are discharged, your primary care physician will handle any further medical issues. Please note that NO REFILLS for any discharge medications will be authorized once you are discharged, as it is imperative that you return to your primary care physician (or establish a relationship with a primary care physician if you do not have one) for your aftercare needs so that they can reassess your need for medications and monitor your lab values.    Today   CHIEF COMPLAINT:   Chief Complaint  Patient presents with  . GI Bleeding    HISTORY OF PRESENT ILLNESS:  Joseph Hawkins  is a 83 y.o. male with a known history of anemia, chronic kidney disease, hypertension, alcoholic liver cirrhosis, ascites tap done twice in recent past. Came to emergency room today with family after having episodes of dark-colored stool without any associated nausea or vomiting since today morning and feeling very weak and had an episode of fall. Pt have weakness.  Denies use of aspirin, OTC pain meds, BC powders.   VITAL SIGNS:  Blood pressure (!) 87/58, pulse (!) 108, temperature 97.9 F (36.6 C), temperature source Oral, resp. rate 16, height 6' (1.829 m), weight 72.7 kg (160 lb 4.4 oz), SpO2 95 %.  I/O:  No intake or output data in the 24 hours ending 07/04/17 1253  PHYSICAL EXAMINATION:   GENERAL:83 y.o.-year-old patient lying in the bed with no  acute distress. Appears malnourished. EYES: Pupils equal, round, reactive to light and accommodation. No scleral icterus. Extraocular muscles intact.  HEENT: Head atraumatic, normocephalic. Oropharynx and nasopharynx clear.  NECK: Supple, no jugular venous distention. No thyroid enlargement, no tenderness.  LUNGS: Normal breath sounds bilaterally, no wheezing, rales,rhonchi or crepitation. No use of accessory muscles of respiration.  CARDIOVASCULAR: S1, S2 normal. No murmurs, rubs, or gallops.  ABDOMEN: Soft, nontender, no distended. Bowel sounds present. No organomegaly or mass.  EXTREMITIES: No pedal edema, cyanosis, or clubbing.  NEUROLOGIC: Cranial nerves II through XII are intact. Muscle strength3-4/5 in all extremities. Sensation intact. Gait not checked.  PSYCHIATRIC: The patient is alert and oriented x2.  SKIN: No obvious rash, lesion, or ulcer.     DATA REVIEW:   CBC Recent Labs  Lab 06/28/17 0620  WBC 7.5  HGB 11.1*  HCT 32.4*  PLT 61*    Chemistries  No results for input(s): NA, K, CL, CO2, GLUCOSE, BUN, CREATININE, CALCIUM, MG, AST, ALT, ALKPHOS,  BILITOT in the last 168 hours.  Invalid input(s): GFRCGP  Cardiac Enzymes No results for input(s): TROPONINI in the last 168 hours.  Microbiology Results  Results for orders placed or performed during the hospital encounter of 05/02/17  Culture, blood (routine x 2)     Status: Abnormal   Collection Time: 05/02/17  2:53 PM  Result Value Ref Range Status   Specimen Description   Final    BLOOD BLOOD LEFT HAND Performed at [facility], [address], [place], [place] [address]    Special Requests   Final    BOTTLES DRAWN AEROBIC AND ANAEROBIC Blood Culture results may not be optimal due to an inadequate volume of blood received in culture bottles Performed at [facility], [address] [place], [place] [address]    Culture  Setup Time   Final    GRAM NEGATIVE RODS IN BOTH AEROBIC AND  ANAEROBIC BOTTLES CRITICAL RESULT CALLED TO, READ BACK BY AND VERIFIED WITH: MATT MCBANE @ 0206 ON 05/03/2017 BY CAF Performed at [facility], [place] [address] [place], [place] [address]    Culture ESCHERICHIA COLI (A)  Final   Report Status 05/05/2017 FINAL  Final   Organism ID, Bacteria ESCHERICHIA COLI  Final      Susceptibility   Escherichia coli - MIC*    AMPICILLIN 16 INTERMEDIATE Intermediate     CEFAZOLIN <=4 SENSITIVE Sensitive     CEFEPIME <=1 SENSITIVE Sensitive     CEFTAZIDIME <=1 SENSITIVE Sensitive     CEFTRIAXONE <=1 SENSITIVE Sensitive     CIPROFLOXACIN <=0.25 SENSITIVE Sensitive     GENTAMICIN <=1 SENSITIVE Sensitive     IMIPENEM <=0.25 SENSITIVE Sensitive     TRIMETH/SULFA <=20 SENSITIVE Sensitive     AMPICILLIN/SULBACTAM 4 SENSITIVE Sensitive     PIP/TAZO <=4 SENSITIVE Sensitive     Extended ESBL NEGATIVE Sensitive     * ESCHERICHIA COLI  Culture, blood (routine x 2)     Status: Abnormal   Collection Time: 05/02/17  2:53 PM  Result Value Ref Range Status   Specimen Description   Final    BLOOD RIGHT ANTECUBITAL Performed at [facility], [address], [place], [place] [address]    Special Requests   Final    Blood Culture results may not be optimal due to an inadequate volume of blood received in culture bottles Performed at [facility], [address] [place], [place] [address]    Culture  Setup Time   Final    GRAM NEGATIVE RODS IN BOTH AEROBIC AND ANAEROBIC BOTTLES CRITICAL RESULT CALLED TO, READ BACK BY AND VERIFIED WITH: MATT MCBANE @ 0206 ON 05/03/2017 BY CAF Performed at [facility] [address] [place], [place] [address]    Culture (A)  Final    ESCHERICHIA COLI SUSCEPTIBILITIES PERFORMED ON PREVIOUS CULTURE WITHIN THE LAST 5 DAYS. Performed at [facility], [place] [address] [place], [place] [address]    Report Status 05/05/2017 FINAL  Final  C difficile quick scan w PCR reflex     Status: None    Collection Time: 05/02/17  2:53 PM  Result Value Ref Range Status   C Diff antigen NEGATIVE NEGATIVE Final   C Diff toxin NEGATIVE NEGATIVE Final   C Diff interpretation No C. difficile detected.  Final    Comment: Performed at [facility], [address], [place], [place] [address]  Blood Culture ID Panel (Reflexed)  Status: Abnormal   Collection Time: 05/02/17  2:53 PM  Result Value Ref Range Status   Enterococcus species NOT DETECTED NOT DETECTED Final   Listeria monocytogenes NOT DETECTED NOT DETECTED Final   Staphylococcus species NOT DETECTED NOT DETECTED Final   Staphylococcus aureus NOT DETECTED NOT DETECTED Final   Streptococcus species NOT DETECTED NOT DETECTED Final   Streptococcus agalactiae NOT DETECTED NOT DETECTED Final   Streptococcus pneumoniae NOT DETECTED NOT DETECTED Final   Streptococcus pyogenes NOT DETECTED NOT DETECTED Final   Acinetobacter baumannii NOT DETECTED NOT DETECTED Final   Enterobacteriaceae species DETECTED (A) NOT DETECTED Final    Comment: Enterobacteriaceae represent a large family of gram-negative bacteria, not a single organism. CRITICAL RESULT CALLED TO, READ BACK BY AND VERIFIED WITH: MATT MCBANE @ 0206 ON 05/03/2017 BY CAF    Enterobacter cloacae complex NOT DETECTED NOT DETECTED Final   Escherichia coli DETECTED (A) NOT DETECTED Final    Comment: CRITICAL RESULT CALLED TO, READ BACK BY AND VERIFIED WITH: MATT MCBANE @ 0206 ON 05/03/2017 BY CAF    Klebsiella oxytoca NOT DETECTED NOT DETECTED Final   Klebsiella pneumoniae NOT DETECTED NOT DETECTED Final   Proteus species NOT DETECTED NOT DETECTED Final   Serratia marcescens NOT DETECTED NOT DETECTED Final   Carbapenem resistance NOT DETECTED NOT DETECTED Final   Haemophilus influenzae NOT DETECTED NOT DETECTED Final   Neisseria meningitidis NOT DETECTED NOT DETECTED Final   Pseudomonas aeruginosa NOT DETECTED NOT DETECTED Final   Candida albicans NOT DETECTED NOT DETECTED Final    Candida glabrata NOT DETECTED NOT DETECTED Final   Candida krusei NOT DETECTED NOT DETECTED Final   Candida parapsilosis NOT DETECTED NOT DETECTED Final   Candida tropicalis NOT DETECTED NOT DETECTED Final    Comment: Performed at [facility], [address] [place], [place] [address]  Urine Culture     Status: Abnormal   Collection Time: 05/03/17  9:16 AM  Result Value Ref Range Status   Specimen Description   Final    URINE, RANDOM Performed at [facility], [address], [place], [place] [address]    Special Requests   Final    NONE Performed at [facility], [address], [place], [place] [address]    Culture (A)  Final    <10,000 COLONIES/mL INSIGNIFICANT GROWTH Performed at [facility], [place] [address] [place], [place] [address]    Report Status 05/04/2017 FINAL  Final  Body fluid culture     Status: None   Collection Time: 05/03/17 11:30 AM  Result Value Ref Range Status   Specimen Description   Final    PERITONEAL Performed at [facility], [address], [place], [place] [address]    Special Requests   Final    NONE Performed at [facility], [place], [place] [address]    Gram Stain   Final    RARE WBC PRESENT,BOTH PMN AND MONONUCLEAR NO ORGANISMS SEEN    Culture   Final    NO GROWTH 3 DAYS Performed at [facility], [place] [address], [place],  [address]    Report Status 05/06/2017 FINAL  Final    RADIOLOGY:  No results found.  EKG:   Orders placed or performed during the hospital encounter of 06/16/17  . ED EKG within 10 minutes  . ED EKG within 10 minutes  . EKG      Management plans discussed with the patient,  family and they are in agreement.  CODE STATUS:  Code Status History    Date Active Date Inactive Code Status Order ID Comments User Context   06/25/2017 1111 06/28/2017 1706 DNR [id number]  Vaughan Basta, MD Inpatient   06/25/2017 1014  06/25/2017 1111 DNR [id number]  Vaughan Basta, MD ED   05/02/2017 1838 05/07/2017 1855 Full Code [id number]  Epifanio Lesches, MD ED   05/07/2013 1849 05/20/2013 1715 Full Code [id number]  Jacqulynn Cadet, MD Inpatient   05/06/2013 0109 05/07/2013 1849 Full Code [id number]  Germain Osgood, PA-C Inpatient    Questions for Most Recent Historical Code Status (Order [id number])    Question Answer Comment   In the event of cardiac or respiratory ARREST Do not call a "code blue"    In the event of cardiac or respiratory ARREST Do not perform Intubation, CPR, defibrillation or ACLS    In the event of cardiac or respiratory ARREST Use medication by any route, position, wound care, and other measures to relive pain and suffering. May use oxygen, suction and manual treatment of airway obstruction as needed for comfort.         Advance Directive Documentation     Most Recent Value  Type of Advance Directive  Healthcare Power of Attorney  Pre-existing out of facility DNR order (yellow form or pink MOST form)  -  "MOST" Form in Place?  -      TOTAL TIME TAKING CARE OF THIS PATIENT: 35 minutes.    Vaughan Basta M.D on 07/04/2017 at 12:53 PM  Between 7am to 6pm - Pager - [phone]  After 6pm go to [email or web address] - password [email or web address] [facility]  Office  [phone]  CC: Primary care physician; Baxter Hire, MD   Note: This dictation was prepared with Dragon dictation along with smaller phrase technology. Any transcriptional errors that result from this process are unintentional.

## 2017-07-11 ENCOUNTER — Ambulatory Visit: Payer: Medicare HMO | Admitting: Gastroenterology

## 2017-07-22 ENCOUNTER — Telehealth: Payer: Self-pay | Admitting: *Deleted

## 2017-07-22 NOTE — Telephone Encounter (Signed)
[facility] hospice and said patient needs to be on hospice care. She was calling to ask if we would refer patient. I explained to her that we did not order Home Health and have not seen patient since August 2018 and that he per his chart had been in hospital with GI Hemorrhage and that his Phone call should be contacted. She will call Dr Edwina Barth

## 2017-08-01 DEATH — deceased

## 2017-08-12 ENCOUNTER — Other Ambulatory Visit: Payer: Medicare Other

## 2017-08-12 ENCOUNTER — Ambulatory Visit: Payer: Medicare Other | Admitting: Oncology

## 2017-08-22 ENCOUNTER — Ambulatory Visit: Payer: Medicare HMO | Admitting: Gastroenterology

## 2017-09-16 ENCOUNTER — Ambulatory Visit: Payer: Medicare HMO | Admitting: Urology

## 2017-12-05 LAB — ANTI-MICROSOMAL ANTIBODY LIVER / KIDNEY
